# Patient Record
Sex: Female | Born: 1980 | Race: White | Hispanic: Yes | Marital: Married | Smoking: Current every day smoker
Health system: Southern US, Community
[De-identification: ages and names within clinical notes are randomized; demographics above are authoritative.]

## PROBLEM LIST (undated history)

## (undated) DIAGNOSIS — Z6841 Body Mass Index (BMI) 40.0 and over, adult: Secondary | ICD-10-CM

## (undated) DIAGNOSIS — F341 Dysthymic disorder: Secondary | ICD-10-CM

## (undated) DIAGNOSIS — G8929 Other chronic pain: Secondary | ICD-10-CM

## (undated) DIAGNOSIS — J0100 Acute maxillary sinusitis, unspecified: Secondary | ICD-10-CM

## (undated) DIAGNOSIS — R0602 Shortness of breath: Secondary | ICD-10-CM

## (undated) DIAGNOSIS — M25561 Pain in right knee: Secondary | ICD-10-CM

## (undated) DIAGNOSIS — M25461 Effusion, right knee: Secondary | ICD-10-CM

## (undated) DIAGNOSIS — F3289 Other specified depressive episodes: Secondary | ICD-10-CM

## (undated) DIAGNOSIS — R1011 Right upper quadrant pain: Secondary | ICD-10-CM

## (undated) DIAGNOSIS — G931 Anoxic brain damage, not elsewhere classified: Secondary | ICD-10-CM

## (undated) DIAGNOSIS — J45909 Unspecified asthma, uncomplicated: Secondary | ICD-10-CM

## (undated) DIAGNOSIS — Z8614 Personal history of Methicillin resistant Staphylococcus aureus infection: Secondary | ICD-10-CM

## (undated) DIAGNOSIS — F1911 Other psychoactive substance abuse, in remission: Secondary | ICD-10-CM

## (undated) HISTORY — DX: Personal history of Methicillin resistant Staphylococcus aureus infection: Z86.14

## (undated) HISTORY — DX: Other psychoactive substance abuse, in remission: F19.11

## (undated) HISTORY — DX: Anoxic brain damage, not elsewhere classified: G93.1

## (undated) HISTORY — PX: TUBAL LIGATION: SHX77

---

## 2005-12-24 ENCOUNTER — Observation Stay: Payer: Self-pay | Admitting: Obstetrics and Gynecology

## 2006-03-09 ENCOUNTER — Inpatient Hospital Stay: Payer: Self-pay

## 2008-07-16 ENCOUNTER — Inpatient Hospital Stay: Payer: Self-pay

## 2008-09-16 ENCOUNTER — Emergency Department: Payer: Self-pay | Admitting: Emergency Medicine

## 2009-01-06 ENCOUNTER — Emergency Department: Payer: Self-pay | Admitting: Emergency Medicine

## 2009-09-26 ENCOUNTER — Emergency Department: Payer: Self-pay | Admitting: Emergency Medicine

## 2010-01-27 LAB — TSH: TSH, High Sensitivity: 2.216 u[IU]/mL (ref 0.55–4.78)

## 2010-01-27 LAB — LUTEINIZING HORMONE: LH: 7.1 MIU/L

## 2010-01-28 LAB — CBC WITH AUTO DIFFERENTIAL
Basophils %: 0.4 % (ref 0–1)
Basophils Absolute: 0 10*3/uL (ref 0.0–0.1)
Eosinophils %: 3.7 % (ref 0–7)
Eosinophils Absolute: 0.2 10*3/uL (ref 0.0–0.6)
Hematocrit: 40.9 % (ref 37.0–47.0)
Hemoglobin: 13.9 g/dL (ref 12.0–16.0)
Lymphocytes %: 25.5 % (ref 16–42)
Lymphocytes Absolute: 1.5 10*3/uL (ref 0.6–4.3)
MCH: 31.1 pg — ABNORMAL HIGH (ref 27–31)
MCHC: 33.9 % (ref 32.0–36.0)
MCV: 91.9 FL (ref 80–100)
MPV: 8.6 FL (ref 7.5–11.1)
Monocytes %: 5.8 % (ref 4–12)
Monocytes Absolute: 0.3 10*3/uL (ref 0.2–1.1)
Neutrophils Absolute: 3.8 10*3/uL (ref 1.5–7.0)
Platelets: 253 10*3/uL (ref 150–400)
RBC: 4.46 10*6/uL (ref 4.20–5.40)
RDW: 12.9 % (ref 11.7–14.9)
Segs Relative: 64.6 % (ref 44–76)
WBC: 5.8 10*3/uL (ref 4.8–10.8)

## 2010-01-28 LAB — COMPREHENSIVE METABOLIC PANEL
ALT: 19 U/L (ref 7–35)
AST: 21 U/L (ref 8–33)
Albumin,Serum: 4.3 g/dL (ref 3.5–5.0)
Alkaline Phosphatase: 70 U/L (ref 25–100)
BUN: 13 mg/dL (ref 6–23)
CO2: 29 mMol/L (ref 20–32)
Calcium: 9.3 mg/dL (ref 8.3–10.6)
Chloride: 104 mMol/L (ref 99–109)
Creatinine: 0.7 mg/dL (ref 0.6–1.1)
GFR African American: >60 mL/min/{1.73_m2}
GFR Non-African American: >60 mL/min/{1.73_m2}
Glucose, GTT - Fasting: 81 mg/dL (ref 50–99)
Potassium: 4.3 MMOL/L (ref 3.5–5.1)
Sodium: 140 mmol/L (ref 136–145)
Total Bilirubin: 0.5 mg/dL (ref 0.3–1.2)
Total Protein: 7.1 g/dL (ref 6.4–8.3)

## 2010-01-28 LAB — LIPID PANEL
Cholesterol: 176 MG/DL (ref <200)
HDL: 32 MG/DL — ABNORMAL LOW (ref >60)
LDL Direct: 157 MG/DL — ABNORMAL HIGH (ref <100)
Triglycerides: 125 MG/DL (ref <150)

## 2010-01-28 LAB — ESTRADIOL: Estradiol: 130 pg/ml

## 2010-01-28 LAB — FOLLICLE STIMULATING HORMONE: FSH: 6 MIU/L

## 2011-03-07 ENCOUNTER — Emergency Department: Payer: Self-pay | Admitting: Emergency Medicine

## 2011-03-27 ENCOUNTER — Emergency Department: Payer: Self-pay | Admitting: Emergency Medicine

## 2011-03-29 ENCOUNTER — Emergency Department: Payer: Self-pay | Admitting: Emergency Medicine

## 2011-04-15 ENCOUNTER — Emergency Department: Payer: Self-pay | Admitting: Emergency Medicine

## 2011-04-18 ENCOUNTER — Emergency Department: Payer: Self-pay | Admitting: Emergency Medicine

## 2011-09-02 LAB — VARICELLA ZOSTER ANTIBODY, IGG: Varicella-Zoster Virus Ab, Igg: 296.1

## 2012-10-03 NOTE — Progress Notes (Signed)
Physical Therapy  Initial Assessment  Date: 10/03/2012  Patient Name: Linda Blevins  MRN: [id number]  DOB: [birth year]     Treatment Diagnosis: Pain, weakness,     Subjective   General  Chart Reviewed: Yes  Patient assessed for rehabilitation services?: Yes  Additional Pertinent Hx: Pt reports only past medical hx is a hysterectomy ; Pt states she is in good health.   Referring Practitioner: Richardean Canal   Diagnosis: 726.5 R hip (Enthesopathy of the hip)  Follows Commands: Within Functional Limits  General Comment  Comments: LEFS: 61/80   ;   Pt reports that she has an x-ray of the R hip approx 2 months ago - pt reports that the x-rays were negative and the doctor suggested it is likely bursitis   PT Visit Information  PT Insurance Information: Anthem - 30 visits - 0 used this year   Subjective  Subjective: Pt reports insidious onset of R hip pain approx 1.5 years ago. Pt reports that she is trying to lose weight, but it is to the point that going to the gym is getting too painful.   Pain Screening  Patient Currently in Pain: Yes  Pain Assessment  Pain Assessment: 0-10  Pain Level: 4 (At worst 8/10)  Pain Location: Hip  Pain Orientation: Right (Lateral aspect of hip )  Pain Descriptors: Aching;Throbbing  Pain Frequency: Continuous (Intensity varies with activity )  Effect of Pain on Daily Activities: Pt has had to call off work several times due to the intensity of the pain   Increases pain: weight bearing for extended periods of time, sitting for prolonged periods, sleeping on side  Decreases pain: heat and biofreeze      Home Living  Home Living  Type of Home: House  Home Layout: One level;Laundry in basement  Home Access:  (4 entry steps )  Additional Comments: Repetitive use of steps causes irritation   Orientation  Overall Orientation Status: Within Normal Limits  Objective  IADL History  Homemaking Responsibilities: Yes  Meal Prep Responsibility: Primary  Laundry Responsibility: Primary  Cleaning  Responsibility: Primary  Shopping Responsibility: Primary  Child Care Responsibility: Primary (2 children (6 and 8))  Active Driver: Yes  Mode of Transportation: Car  Occupation: Full time employment  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   Observation/Palpation  Posture: Good  Palpation: TTP noted at R greater trochanter and gluteus mm ; Pelvic landmarks symmetrical   Observation: Pes planus of B feet, valgus alignment of bilat LEs   ;  pt is overweight   AROM RLE (degrees)  RLE AROM: WNL  AROM LLE (degrees)  LLE AROM: WNL  Strength RLE  Strength RLE: WNL  Comment: Grossly 5/5 except hip mm 4+/5  Strength LLE  Strength LLE: WNL  Comment: Grossly 5/5 except hip mm 4+/5  Tone RLE  RLE Tone: Normotonic  Tone LLE  LLE Tone: Normotonic  Motor Control  Gross Motor?: WNL  Additional Measures  Flexibility: Hamstrings (90-90) L and R -15 deg ; piriformis mild restriction, IT band mildly restricted R ;  Thomas test positive bilaterally   Special Tests: Hip scour negative bilaterally ; SLR test negative bilaterally   Sensation  Overall Sensation Status: WNL (Pt denies N/T)        Ambulation  Ambulation?: Yes  Ambulation 1  Comments: Mildly antalgic  Assessment   Assessment: Decreased strength;Decreased high-level IADLs  Assessment: Pt presents with 1.5 year history of insidious onset R hip pain. 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 Pt demonstrates slight decrease in hip strength bilaterally (4+/5) and has TTP at R greater trochanter and gluteus mm. Pt is negative for hip scour and SLR test. Pt would benefit from skilled therapy to decrease pain and increase hip strength to allow improved daily function.   Treatment Diagnosis: Pain, weakness,   Prognosis: Good  Time In: 0810  Time Out: 0858  Timed Code  Treatment Minutes: 24 Minutes  Total Treatment Time: 48  Activity Tolerance  Activity Tolerance: Patient Tolerated treatment well  Plan  Plan: Plan of care initiated       Plan   Plan  Plan: Plan of care initiated  Timed Code Treatment Minutes: 24 Minutes  Total Treatment Time: 48  Frequency and duration of tx  Days: 2-3  Weeks: up to 6    Goals  Short term goals  Time Frame for Short term goals: 3 weeks   Short term goal 1: Pt to be I and compliant with HEP.   Long term goals  Time Frame for Long term goals : 6 weeks   Long term goal 1: Pt to tolerate full work week with pain no greater than 3/10 in R hip.   Long term goal 2: Pt to demonstrate hip strength 5/5   Long term goal 3: LEFS to improve to 70/80       Nadene Rubins, PT, DPT

## 2012-10-03 NOTE — Other (Addendum)
Physical Therapy Daily Treatment Note  Date:  10/03/2012    Patient Name:  Linda Blevins    DOB:  [birth year]  MRN: [id number]  Restrictions/Precautions:    Diagnosis: Diagnosis: 726.5 R hip (Enthesopathy of the hip)    Treatment Diagnosis:  Pain, weakness  Insurance/Certification information:  Anthem ; 30 visits (0 used this year)  Next Physician Visit:  Referring Physician:  Richardean Canal   Plan of care signed (Y/N):    Visit# / total visits: 1 / 15  Initial Pain level: 4/10  (8/10 at worst)     Subjective:  Insidious onset of R hip pain approx 1.5 years ago.   Any changes to Ambulatory Summary Sheet?      Goals:  Short term goals  Time Frame for Short term goals: 3 weeks   Short term goal 1: Pt to be I and compliant with HEP.   Long term goals  Time Frame for Long term goals : 6 weeks   Long term goal 1: Pt to tolerate full work week with pain no greater than 3/10 in R hip.   Long term goal 2: Pt to demonstrate hip strength 5/5   Long term goal 3: LEFS to improve to 70/80      Exercise/Equipment/Modalities Date 10-03-12 Date Date Date      Evaluation         [place] , 1.5w/cm2, 100%, R greater trochanter x 8 min (+3 min for set up)  Pt in SL with pillow between knees         Clamshells #1 and #2 ;  20 reps ea         SLR Next visit         Prone hip ext Next visit        Stretches (hip flexor , piriformis, hamstrings) Next visit                                                                                                                                 Modality/intervention used:  [x]  Therapeutic Exercise  []  Modalities:  []  Therapeutic Activity     [x]  Ultrasound  []  Elec  Stim  []  Gait Training      []  Cervical Traction []  Lumbar Traction  []  Neuromuscular Re-education    []  Cold/hotpack []  Iontophoresis   [x]  Instruction in HEP      []  Vasopneumatic     []  Manual Therapy               []  Aquatic Therapy      Home Exercise Program/Education provided to patient:  Pt given handout for initial exercises to  be included in HEP - pt demonstrated proper technique - will review exercises at next visit.  (Initial HEP to include clamshell #1 and #2)    Discussed sleeping with pillow between knees when sleeping in SL position.     Manual Treatments/ Other Therapeutic Activities:  Objective Findings:  See evaluation  At initial evaluation:   B hip strength 4+/5   TTP at R greater trochanter and gluteus mm.     Communication with other providers:    Adverse reactions to treatment:    Treatment/Activity Tolerance:  []  Patient limited by fatigue       []  Patient limited by pain []  Patient limited by other medical complications  [x]  Other: Tolerated initial exercises well     Post Tx Pain Rating: 3/10    Plan:   []  Continue per plan of care []  Alter current plan   [x]  Plan of care initiated []  Hold pending MD visit []  Discharge    Plan for Next Session:      Progress Note Due:        Time In:810  Time Out:858  Timed Code Treatment Minutes:  24  Total Treatment Minutes:  48    Electronically signed by:  Nadene Rubins, PT 10/03/2012, 9:13 AM

## 2012-10-03 NOTE — Plan of Care (Signed)
Outpatient Physical Therapy        [x]  Phone: [phone]   Fax: [phone]   Pediatric Therapy          []  Phone: [phone]   Fax: [phone]  Pediatric [place]          []  Phone: [phone]   Fax: [phone]      To: Referring Practitioner: Richardean Canal     From: Nadene Rubins, PT     Patient: Linda Blevins       DOB: 1981/11/19  Diagnosis: Diagnosis: 726.5 R hip (Enthesopathy of the hip)   Treatment Diagnosis: Treatment Diagnosis: Pain, weakness,    Date: 10/03/2012    Physical Therapy Certification/Re-Certification Form  Dear Dr. Raeanne Barry,   The following patient has been evaluated for physical therapy services and for therapy to continue. Please review the attached evaluation and/or summary of the patient's plan of care, and verify that you agree therapy should continue by signing the attached document and sending it back to our office.    Assessment:  Assessment: Decreased strength;Decreased high-level IADLs  Assessment: Pt presents with 1.5 year history of insidious onset R hip pain. 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 Pt demonstrates slight decrease in hip strength bilaterally (4+/5) and has TTP at R greater trochanter and gluteus mm. Pt is negative for hip scour and SLR test. Pt would benefit from skilled therapy to decrease pain and increase hip strength to allow improved daily function.     Plan of Care/Treatment to date:  [x]  Therapeutic Exercise    [x]  Modalities: As needed   []  Therapeutic Activity     [x]  Ultrasound  [x]  Elec Stimulation  []  Gait Training      []  Cervical Traction []  Lumbar Traction  []  Neuromuscular Re-education    [x]  Cold/hotpack []  Iontophoresis   [x]  Instruction in HEP     Other:  [x]  Manual Therapy      []  vasopneumatic            []  Aquatic Therapy      []            ?      Frequency/Duration:  # Days per week: []  1 day # Weeks: []  1  week []  5 weeks     [x]  2-3 days?   []  2 weeks [x]  up to 6 weeks     []  3 days   []  3 weeks []  7 weeks     []  4 days   []  4 weeks []  8 weeks    Rehab Potential/Progress: []  Excellent [x]  Good []  Fair  []  Poor     Goals:  Short term goals  Time Frame for Short term goals: 3 weeks   Short term goal 1: Pt to be I and compliant with HEP.   Long term goals  Time Frame for Long term goals : 6 weeks   Long term goal 1: Pt to tolerate full work week with pain no greater than 3/10 in R hip.   Long term goal 2: Pt to demonstrate hip strength 5/5   Long term goal 3: LEFS to improve to 70/80     Electronically signed by:  Nadene Rubins, PT, 10/03/2012, 9:11 AM        If you have any questions or concerns, please don't hesitate  to call.  Thank you for your referral.      Physician Signature:_________________Date:____________Time: ________  By signing above, therapist???s plan is approved by physician

## 2012-10-04 NOTE — Other (Signed)
Physical Therapy Daily Treatment Note  Date:  10/04/2012    Patient Name:  Linda Blevins    DOB:  February 03, 1981  MRN: [id number]  Restrictions/Precautions:    Diagnosis: Diagnosis: 726.5 R hip (Enthesopathy of the hip)    Treatment Diagnosis:  Pain, weakness  Insurance/Certification information:  Anthem ; 30 visits (0 used this year)  Next Physician Visit:  Referring Physician:  Richardean Canal   Plan of care signed (Y/N):    Visit# / total visits: 2 / 15  Initial Pain level: 2/10      Subjective:  Pt reports pain was 4/10 at the start of her work shift last night, but got better as the shift went on and is down to 1-2/10 this morning.   Any changes to Ambulatory Summary Sheet?      Goals:  Short term goals  Time Frame for Short term goals: 3 weeks   Short term goal 1: Pt to be I and compliant with HEP.   Long term goals  Time Frame for Long term goals : 6 weeks   Long term goal 1: Pt to tolerate full work week with pain no greater than 3/10 in R hip.   Long term goal 2: Pt to demonstrate hip strength 5/5   Long term goal 3: LEFS to improve to 70/80      Exercise/Equipment/Modalities Date 10-03-12 Date 10-04-12 Date Date      Evaluation         [place] , 1.5w/cm2, 100%, R greater trochanter x 8 min (+3 min for set up)  Pt in SL with pillow between knees  , 1.5w/cm2, 100%, R greater trochanter x 8 min (+3 min for set up)  Pt in SL with pillow between knees        Clamshells #1 and #2 ;  20 reps ea  #1 and # 2 ; 20 reps ea        SLR Next visit  20 reps        Prone hip ext Next visit  20 reps ; R/L      Stretches (hip flexor , piriformis, hamstrings) Next visit  x30 sec ea R/L   Piriformis - supine   HS - seated w/ leg on mat   Hip flexor - runners stretch      Lunges                                                                                                                          Modality/intervention used:  [x]  Therapeutic Exercise  []  Modalities:  []  Therapeutic Activity     [x]  Ultrasound  []  Elec   Stim  []  Gait Training      []  Cervical Traction []  Lumbar Traction  []  Neuromuscular Re-education    []  Cold/hotpack []  Iontophoresis   [x]  Instruction in HEP      []  Vasopneumatic     []  Manual Therapy               []   Aquatic Therapy      Home Exercise Program/Education provided to patient:  Reviewed clamshells - added stretches     10-03-12: Discussed sleeping with pillow between knees when sleeping in SL position.     Manual Treatments/ Other Therapeutic Activities:    Objective Findings:  See evaluation  At initial evaluation:   B hip strength 4+/5   TTP at R greater trochanter and gluteus mm.     Communication with other providers:    Adverse reactions to treatment:    Treatment/Activity Tolerance:  []  Patient limited by fatigue       []  Patient limited by pain []  Patient limited by other medical complications  [x]  Other: Minimal muscle fatigue with exercises      Post Tx Pain Rating: 1/10    Plan:   [x]  Continue per plan of care []  Alter current plan   []  Plan of care initiated []  Hold pending MD visit []  Discharge    Plan for Next Session:  Cont current POC, advance as tolerated     Progress Note Due:        Time In:800  Time Out:830  Timed Code Treatment Minutes:  30  Total Treatment Minutes:  30    Electronically signed by:  Nadene Rubins, PT 10/04/2012, 8:00 AM

## 2012-10-08 NOTE — Other (Signed)
Physical Therapy Daily Treatment Note  Date:  10/08/2012    Patient Name:  Linda Blevins    DOB:  10/13/1981  MRN: [id number]  Restrictions/Precautions:    Diagnosis: Diagnosis: 726.5 R hip (Enthesopathy of the hip)    Treatment Diagnosis:  Pain, weakness  Insurance/Certification information:  Anthem ; 30 visits (0 used this year)  Next Physician Visit:  Referring Physician:  Richardean Canal   Plan of care signed (Y/N):    Visit# / total visits: 3 / 15  Initial Pain level: 4/10      Subjective: Pt states she felt pretty good over the weekend, pain 2/10. Having increase pain this morning due to just getting off work. Pt also states pillow between her knees at night has been helpful.   Any changes to Ambulatory Summary Sheet?no      Goals:  Short term goals  Time Frame for Short term goals: 3 weeks   Short term goal 1: Pt to be I and compliant with HEP.   Long term goals  Time Frame for Long term goals : 6 weeks   Long term goal 1: Pt to tolerate full work week with pain no greater than 3/10 in R hip.   Long term goal 2: Pt to demonstrate hip strength 5/5   Long term goal 3: LEFS to improve to 70/80      Exercise/Equipment/Modalities Date 10-03-12 Date 10-04-12 Date 10/08/12 Date      Evaluation         [place] , 1.5w/cm2, 100%, R greater trochanter x 8 min (+3 min for set up)  Pt in SL with pillow between knees  , 1.5w/cm2, 100%, R greater trochanter x 8 min (+3 min for set up)  Pt in SL with pillow between knees  , 1.5w/cm2, 100%, R greater trochanter x 9 min (+3 min for set up)  Pt in SL with pillow between knees      Clamshells #1 and #2 ;  20 reps ea  #1 and # 2 ; 20 reps ea  #1 and # 2 ; 20 reps ea      SLR Next visit  20 reps  20 reps R/L      Prone hip ext Next visit  20 reps ; R/L 20 reps ; R/L     Stretches (hip flexor , piriformis, hamstrings) Next visit  x30 sec ea R/L   Piriformis - supine   HS - seated w/ leg on mat   Hip flexor - runners stretch  x 1 min ea R/L - Manual Piriformis and HS  seated leg on mat  Hip flexor - runners stretch 30 sec x 2     Lunges     X 10 reps R/L with support                                                                                                                     Modality/intervention used:  [x]  Therapeutic Exercise  []  Modalities:  []   Therapeutic Activity     [x]  Ultrasound  []  Elec  Stim  []  Gait Training      []  Cervical Traction []  Lumbar Traction  []  Neuromuscular Re-education    []  Cold/hotpack []  Iontophoresis   [x]  Instruction in HEP      []  Vasopneumatic     []  Manual Therapy               []  Aquatic Therapy      Home Exercise Program/Education provided to patient:  Pt reports good compliance. To continue as instructed.    10-03-12: Discussed sleeping with pillow between knees when sleeping in SL position.     Manual Treatments/ Other Therapeutic Activities:manual Piriformis stretch    Objective Findings:  See evaluation  At initial evaluation:   B hip strength 4+/5   TTP at R greater trochanter and gluteus mm.     Communication with other providers:    Adverse reactions to treatment:    Treatment/Activity Tolerance:  []  Patient limited by fatigue       []  Patient limited by pain []  Patient limited by other medical complications  [x]  Other: Minimal muscle fatigue following exercises      Post Tx Pain Rating: 3-4/10    Plan:   [x]  Continue per plan of care []  Alter current plan   []  Plan of care initiated []  Hold pending MD visit []  Discharge    Plan for Next Session:  Cont current POC, advance as tolerated     Progress Note Due:        Time In:816  Time Out:855  Timed Code Treatment Minutes:  39  Total Treatment Minutes:  39    Electronically signed by:  Madelon Lips, PTA 10/08/2012, 8:16 AM

## 2012-10-10 NOTE — Other (Addendum)
Physical Therapy Daily Treatment Note  Date:  10/10/2012    Patient Name:  Linda Blevins    DOB:  November 15, 1981  MRN: [id number]  Restrictions/Precautions:    Diagnosis: Diagnosis: 726.5 R hip (Enthesopathy of the hip)    Treatment Diagnosis:  Pain, weakness  Insurance/Certification information:  Anthem ; 30 visits (0 used this year)  Next Physician Visit:  Referring Physician:  Richardean Canal   Plan of care signed (Y/N):  YES  Visit# / total visits: 4 / 15  Initial Pain level: 0/10      Subjective: Pt reports that overall her hip is doing a little bit better. Pt states that at night and at the end of her work shifts it is still painful, but not quite as bad. No pain this morning (did not work last night).   Pt returned to working out at the gym without any problems.   Any changes to Ambulatory Summary Sheet?no      Goals:  Short term goals  Time Frame for Short term goals: 3 weeks   Short term goal 1: Pt to be I and compliant with HEP.   Long term goals  Time Frame for Long term goals : 6 weeks   Long term goal 1: Pt to tolerate full work week with pain no greater than 3/10 in R hip.   Long term goal 2: Pt to demonstrate hip strength 5/5   Long term goal 3: LEFS to improve to 70/80      Exercise/Equipment/Modalities Date 10-03-12 Date 10-04-12 Date 10/08/12 Date 10-10-12      Evaluation         [place] , 1.5w/cm2, 100%, R greater trochanter x 8 min (+3 min for set up)  Pt in SL with pillow between knees  , 1.5w/cm2, 100%, R greater trochanter x 8 min (+3 min for set up)  Pt in SL with pillow between knees  , 1.5w/cm2, 100%, R greater trochanter x 9 min (+3 min for set up)  Pt in SL with pillow between knees , 1.5w/cm2, 100%, R greater trochanter x 9 min (+3 min for set up)  Pt in SL with pillow between knees     Clamshells #1 and #2 ;  20 reps ea  #1 and # 2 ; 20 reps ea  #1 and # 2 ; 20 reps ea #1 and #2 ; 25 reps ea      SLR Next visit  20 reps  20 reps R/L 25 reps R/L      Prone hip ext Next visit   20 reps ; R/L 20 reps ; R/L 25 reps R/L     Stretches (hip flexor , piriformis, hamstrings) Next visit  x30 sec ea R/L   Piriformis - supine   HS - seated w/ leg on mat   Hip flexor - runners stretch  x 1 min ea R/L - Manual Piriformis and HS seated leg on mat  Hip flexor - runners stretch 30 sec x 2 HS (edge of mat) x 1 min R/L  Piriformis (supine) x 1 min R/L   Hip Flexor (runner's stretch x 1 min R/L    Lunges     X 10 reps R/L with support x15 reps (alt R/L)   W/o support     Squats on total gym (with Red TB at knees for hip abd during squat)    Next visit  Modality/intervention used:  [x]  Therapeutic Exercise  []  Modalities:  []  Therapeutic Activity     [x]  Ultrasound  []  Elec  Stim  []  Gait Training      []  Cervical Traction []  Lumbar Traction  []  Neuromuscular Re-education    []  Cold/hotpack []  Iontophoresis   [x]  Instruction in HEP      []  Vasopneumatic     []  Manual Therapy               []  Aquatic Therapy      Home Exercise Program/Education provided to patient:  Pt reports good compliance. To continue as instructed. - Provided handout for piriformis stretch     10-03-12: Discussed sleeping with pillow between knees when sleeping in SL position.     Manual Treatments/ Other Therapeutic Activities:    Objective Findings:  See evaluation  At initial evaluation:   B hip strength 4+/5   TTP at R greater trochanter and gluteus mm.     Communication with other providers:    Adverse reactions to treatment:    Treatment/Activity Tolerance:  []  Patient limited by fatigue       []  Patient limited by pain []  Patient limited by other medical complications  [x]  Other: Minimal muscle fatigue following exercises      Post Tx Pain Rating: 0/10    Plan:   [x]  Continue per plan of care []  Alter current plan   []  Plan of care initiated []  Hold pending MD visit []  Discharge    Plan for Next Session:  Cont current POC,  advance as tolerated     Progress Note Due:        Time In:931  Time Out:1009  Timed Code Treatment Minutes:  38  Total Treatment Minutes:  38    Electronically signed by:  Nadene Rubins, PT 10/10/2012, 9:27 AM

## 2012-10-15 NOTE — Other (Signed)
Physical Therapy  Cancellation/No-show Note  Patient Name:  Linda Blevins  DOB:  1980/11/29   Date:  10/15/2012  Cancelled visits to date: 0  No-shows to date: 1    For today's appointment patient:  []   Cancelled  []   Rescheduled appointment  [x]   No-show     Reason given by patient:  []   Patient ill  []   Conflicting appointment  []   No transportation    []   Conflict with work  []   No reason given  []   Other:     Comments:      Electronically signed by:  Selena Lesser, PTA,  10/15/2012, 9:13 AM

## 2012-10-16 NOTE — Other (Signed)
Physical Therapy  Cancellation/No-show Note  Patient Name:  Linda Blevins  DOB:  15-Jan-1981   Date:  10/16/2012  Cancelled visits to date: 0  No-shows to date: 2    For today's appointment patient:  []   Cancelled  []   Rescheduled appointment  [x]   No-show     Reason given by patient:  []   Patient ill  []   Conflicting appointment  []   No transportation    []   Conflict with work  []   No reason given  []   Other:     Comments:      Electronically signed by:  Nadene Rubins, PT, 10/16/2012, 8:44 AM

## 2012-10-17 NOTE — Other (Signed)
Physical Therapy  Cancellation/No-show Note  Patient Name:  Linda Blevins  DOB:  [birth year]   Date:  10/17/2012  Cancelled visits to date: 0  No-shows to date: 3    For today's appointment patient:  []   Cancelled  []   Rescheduled appointment  [x]   No-show     Reason given by patient:  []   Patient ill  []   Conflicting appointment  []   No transportation    []   Conflict with work  []   No reason given  []   Other:     Comments:      Electronically signed by:  Madelon Lips, PTA, 10/17/2012, 9:29 AM

## 2012-11-20 NOTE — Discharge Summary (Signed)
Outpatient Physical Therapy        [x]  Phone: [phone]   Fax: [phone]   Physician: Richardean Canal     From: Nadene Rubins, PT     Patient: Linda PATRICELLI                  DOB: 02/18/1981  Diagnosis: 726.5 R hip     Date: 11/20/2012  Treatment Diagnosis:  Pain, weakness     []   Progress Note                [x]   Discharge Note    Total Visits to date:  4  Cancels/No-shows to date:  3 (in a row)    Subjective: Pt last attended therapy on 10-10-12. At that visit patient denied having pain at the current time. Pt reported that overall her hip was doing a little better, but was still painful at night and after working (less intense pain though). Pt had reported being able to return to the gym without any problems doing light exercise.     Objective/Significant Findings At Last Visit/Comments:    No change from evaluation due to only attending 3 visits after eval.     Assessment:  Pt d/c from PT services at this time due to poor compliance. Pt no showed 3 visits in a row.     Goal Status:   Short term goals   Time Frame for Short term goals: 3 weeks   Short term goal 1: Pt to be I and compliant with HEP. MET  Long term goals   Time Frame for Long term goals : 6 weeks   Long term goal 1: Pt to tolerate full work week with pain no greater than 3/10 in R hip. Not met - pt still having pain after working   Long term goal 2: Pt to demonstrate hip strength 5/5 Not met - pt has HEP to further strengthen hip  Long term goal 3: LEFS to improve to 70/80 Not reassessed       Patient Status: []  Continue per initial plan of Care     [x]  Patient now discharged     []  Additional visits requested, Please re-certify for additional visits:          Electronically signed by:  Nadene Rubins, PT, 11/20/2012, 1:46 PM    If you have any questions or concerns, please don't hesitate to call.  Thank you for your referral.

## 2013-10-29 LAB — LIPID PANEL
Cholesterol, Total: 191 mg/dL
HDL: 36 mg/dL (ref 35–70)
LDL Calculated: 130 mg/dL (ref 0–160)
Triglycerides: 127 mg/dL
VLDL: 25 mg/dL

## 2014-05-22 ENCOUNTER — Emergency Department: Payer: Self-pay

## 2014-05-22 LAB — CBC WITH DIFFERENTIAL/PLATELET
Basophil #: 0.1 10*3/uL (ref 0.0–0.1)
Basophil %: 0.7 %
Eosinophil #: 0.2 10*3/uL (ref 0.0–0.7)
Eosinophil %: 2.5 %
HCT: 36 % (ref 35.0–47.0)
HGB: 12.2 g/dL (ref 12.0–16.0)
LYMPHS ABS: 2.7 10*3/uL (ref 1.0–3.6)
Lymphocyte %: 30.6 %
MCH: 31.2 pg (ref 26.0–34.0)
MCHC: 33.9 g/dL (ref 32.0–36.0)
MCV: 92 fL (ref 80–100)
Monocyte #: 0.6 x10 3/mm (ref 0.2–0.9)
Monocyte %: 7 %
NEUTROS ABS: 5.2 10*3/uL (ref 1.4–6.5)
Neutrophil %: 59.2 %
PLATELETS: 262 10*3/uL (ref 150–440)
RBC: 3.91 10*6/uL (ref 3.80–5.20)
RDW: 13 % (ref 11.5–14.5)
WBC: 8.8 10*3/uL (ref 3.6–11.0)

## 2014-05-22 LAB — PROTIME-INR
INR: 1
Prothrombin Time: 12.7 secs (ref 11.5–14.7)

## 2014-05-22 LAB — APTT: ACTIVATED PTT: 26 s (ref 23.6–35.9)

## 2014-07-19 ENCOUNTER — Inpatient Hospital Stay: Admit: 2014-07-19 | Discharge: 2014-07-19

## 2014-07-19 MED ORDER — OXYCODONE-ACETAMINOPHEN 5-325 MG PO TABS
5-325 MG | Freq: Once | ORAL | Status: AC
Start: 2014-07-19 — End: 2014-07-19
  Administered 2014-07-19: 20:00:00 2 via ORAL

## 2014-07-19 MED ORDER — IBUPROFEN 800 MG PO TABS
800 MG | ORAL_TABLET | Freq: Four times a day (QID) | ORAL | Status: DC | PRN
Start: 2014-07-19 — End: 2015-12-17

## 2014-07-19 MED ORDER — OXYCODONE-ACETAMINOPHEN 5-325 MG PO TABS
5-325 MG | ORAL | Status: DC
Start: 2014-07-19 — End: 2014-07-19

## 2014-07-19 MED ORDER — ORPHENADRINE CITRATE ER 100 MG PO TB12
100 MG | ORAL_TABLET | Freq: Two times a day (BID) | ORAL | Status: AC
Start: 2014-07-19 — End: 2014-07-29

## 2014-07-19 MED ORDER — OXYCODONE-ACETAMINOPHEN 5-325 MG PO TABS
5-325 MG | ORAL_TABLET | ORAL | Status: DC | PRN
Start: 2014-07-19 — End: 2015-12-17

## 2014-07-19 MED FILL — OXYCODONE-ACETAMINOPHEN 5-325 MG PO TABS: 5-325 MG | ORAL | Qty: 2

## 2014-07-19 MED FILL — OXYCODONE-ACETAMINOPHEN 5-325 MG PO TABS: 5-325 MG | ORAL | Qty: 1

## 2014-07-19 NOTE — Discharge Instructions (Signed)
Learning About a Closed Head Injury  What is a closed head injury?     A closed head injury happens when your head gets hit hard. The strong force of the blow causes your brain to shake in your skull. This movement can cause the brain to bruise, swell, or tear. Sometimes nerves or blood vessels also get damaged. This can cause bleeding in or around the brain.  A concussion is a type of closed head injury.  What are the symptoms?  If you have a mild concussion, you may have a mild headache or feel "not quite right." These symptoms are common. They usually go away over a few days to 4 weeks.  But sometimes after a concussion, you feel like you can't function as well as before the injury. And you have new symptoms. This is called postconcussive syndrome. You may:   Find it harder to solve problems, think, concentrate, or remember.   Have headaches.   Have changes in your sleep patterns, such as not being able to sleep or sleeping all the time.   Have changes in your personality.   Not be interested in your usual activities.   Feel angry or anxious without a clear reason.   Have changes in your sex drive.   Lose your sense of taste or smell.   Be dizzy, lightheaded, or unsteady. It may be hard to stand or walk.  How is a closed head injury treated?  Any person who may have a concussion needs to see a doctor. Some people have to stay in the hospital to be watched. Others can go home safely. But if you go home, make sure to have someone watch you closely.  Rest is the best treatment. Get plenty of sleep at night. And try to rest during the day.   Avoid activities that are physically or mentally demanding. These include housework, exercise, and schoolwork. And don't play video games, send text messages, or use the computer. You may need to change your school or work schedule to be able to avoid these activities.   Ask your doctor when it's okay to drive, ride a bike, or operate machinery.   Take an  over-the-counter pain medicine, such as acetaminophen (Tylenol), ibuprofen (Advil, Motrin), or naproxen (Aleve). Be safe with medicines. Read and follow all instructions on the label.   Check with your doctor before you use any other medicines for pain.   Do not drink alcohol or use illegal drugs. They can slow recovery. They can also increase your risk of getting a second head injury.  Follow-up care is a key part of your treatment and safety. Be sure to make and go to all appointments, and call your doctor if you are having problems. It's also a good idea to know your test results and keep a list of the medicines you take.   Where can you learn more?   Go to [email or web address] and sign in to your MyChart account. Enter E235 in the Search Health Information box to learn more about "Learning About a Closed Head Injury."    If you do not have an account, please click on the "Sign Up Now" link.      2006-2015 [employer]. Care instructions adapted under license by [facility]. This care instruction is for use with your licensed healthcare professional. If you have questions about a medical condition or this instruction, always ask your healthcare professional. [employer], Inc[employer] disclaims any warranty or liability for your use of   this information.  Content Version: 10.5.422740; Current as of: April 24, 2013

## 2014-07-19 NOTE — ED Notes (Signed)
Return to room from radiology. resp even,unlabored. Skin w/d. States fell off ladder approx 5-6 ft. Denies hitting head or loc. A/o x3. Pupils equal and round, reactive 4-2. States  + headache. Denies visual distubances. Pain to rt arm with decreased rom. PA at bedside for neuro assessment    Howie Ill, RN  07/19/14 1615

## 2014-07-19 NOTE — ED Provider Notes (Signed)
Triage Chief Complaint:   Arm Pain; Hip Pain; Head Injury; and Back Pain    HOPI:  Linda Blevins is a 33 y.o. female that presents for right sided pain. Onset was around 1400 while attempting to hang something on some siding and she lost her balance on the top of a ladder and she fell about 5 feet on her right side. + LOC she states for about a minute. She cannot move her right arm that well secondary to pain and she has right posterior hip and upper back pain with this as well. No neuro complaints. No alleviating factors PTA. Pain worse with movement. She has a headache from the back of her head forward. Not worst headache of life. No arm or leg weakness, numbness, or tingling, difficulties concentrating, n/v. No CP or SOB. Denies saddle anaesthesia, loss of control or bowel or bladder. She has exhibited no unusual behaviors per family.    ROS:  At least 10 systems reviewed and otherwise acutely negative except as in the HOPI.    History reviewed. No pertinent past medical history.  Past Surgical History   Procedure Laterality Date   ??? Hysterectomy     ??? Tonsillectomy     ??? Adenoidectomy       History reviewed. No pertinent family history.  History     Social History   ??? Marital Status: Married     Spouse Name: N/A     Number of Children: N/A   ??? Years of Education: N/A     Occupational History   ??? Not on file.     Social History Main Topics   ??? Smoking status: Never Smoker    ??? Smokeless tobacco: Not on file   ??? Alcohol Use: No   ??? Drug Use: Not on file   ??? Sexual Activity: Not on file     Other Topics Concern   ??? Not on file     Social History Narrative   ??? No narrative on file     Current Facility-Administered Medications   Medication Dose Route Frequency Provider Last Rate Last Dose   ??? oxyCODONE-acetaminophen (PERCOCET) 5-325 MG per tablet 2 tablet  2 tablet Oral Once Merrilee Seashore, MD         No current outpatient prescriptions on file.     Allergies   Allergen Reactions   ??? Pcn [Penicillins] Hives   ???  Sulfa Antibiotics Hives       Nursing Notes Reviewed    Physical Exam:  ED Triage Vitals   Enc Vitals Group      BP 07/19/14 1456 121/85 mmHg      Pulse 07/19/14 1456 70      Resp 07/19/14 1456 17      Temp 07/19/14 1456 98.6 ??F (37 ??C)      Temp Source 07/19/14 1456 Oral      SpO2 07/19/14 1456 96 %      Weight 07/19/14 1456 260 lb (117.935 kg)      Height 07/19/14 1456  (1.6 m)      Head Cir --       Peak Flow --       Pain Score --       Pain Loc --       Pain Edu? --       Excl. in GC? --      GENERAL APPEARANCE: Awake and alert. Cooperative. No acute distress.  HEAD: Normocephalic. Atraumatic.  EYES: EOMI, PERRL. Sclera anicteric.  ENT: Mucous membranes are moist. Tolerates saliva. No trismus.  NECK: Supple. No meningismus. Trachea midline. FROM neck  HEART: RRR. Radial pulses 2+.  LUNGS: Respirations unlabored. CTAB  ABDOMEN: Soft. Non-tender.  EXTREMITIES: No acute deformities. FROM of her right forearm at elbow, no wrist ttp, she has ttp across her posterior elbow, she has no ttp of her shoulder, she has diffuse mild ttp of her upper right back including over her scapula, - anterior lift-off, - Neer's, - Drop, - Full-can, + right buttock ttp, no c-, t-, or l-spine ttp  SKIN: Warm and dry. She has 2 red marks parallel to each approx 7 cm apart from each other across her entire upper back, this area is ttp  NEUROLOGICAL: No gross facial drooping. Moves all 4 extremities spontaneously. 2+ DTRs equal upper and lower, 5/5 strength BUE and BLE, normal finger to nose and heel to shin tests bil, light touch sensation intact throughout the exam, GCS = 15, A&O x 3  PSYCHIATRIC: Normal mood.    I have reviewed and interpreted all of the currently available lab results from this visit (if applicable):  No results found for this visit on 07/19/14.   Radiographs (if obtained):   The following radiograph was interpreted by myself in the absence of a radiologist:   Radiologist's Report Reviewed:  CT head wo  contrast:   EXAMINATION:  CT OF THE HEAD WITHOUT CONTRAST 07/19/2014 3:13 pm:  TECHNIQUE:  CT of the head was performed without the administration of intravenous  contrast.  COMPARISON:  None.  HISTORY:  fall, pain  FINDINGS:  BRAIN/VENTRICLES: There is no acute intracranial hemorrhage, mass effect or  midline shift. No abnormal extra-axial fluid collection. The gray-white  differentiation is maintained without evidence of an acute infarct. There is  no evidence of hydrocephalus.  ORBITS: The visualized portion of the orbits demonstrate no acute abnormality.  SINUSES: The visualized paranasal sinuses and mastoid air cells demonstrate  no acute abnormality.  SOFT TISSUES/SKULL: No acute abnormality of the visualized skull or soft  tissues.  IMPRESSION:  No acute intracranial abnormality.    DICTATED BY: HUME, DALE     CT c-spine wo contrast:   EXAMINATION:  CT OF THE CERVICAL SPINE WITHOUT CONTRAST 07/19/2014 3:23 pm:  TECHNIQUE:  CT of the cervical spine was performed without the administration of  intravenous contrast. Multiplanar reformatted images are provided for review.  COMPARISON:  None available  HISTORY:  Fall  FINDINGS:  BONES/ALIGNMENT: The craniocervical and atlantoaxial junctions are intact.  The odontoid process is intact. Mild reversal of the cervical curvature may  be secondary to patient positioning or muscle spasm. Otherwise normal  alignment of the cervical spine is maintained. Osseous mineralization is  within normal limits. The vertebral body heights and intervertebral disc  spaces are preserved. No fracture or dislocation is identified.  DEGENERATIVE CHANGES: No significant degenerative changes.  SOFT TISSUES: There is no prevertebral soft tissue swelling.  IMPRESSION:  No acute abnormality of the cervical spine.    DICTATED BY: Beaulah Dinning     X-ray L-spine:   EXAMINATION:  3 VIEWS OF THE LUMBAR SPINE  07/19/2014 3:52 pm  COMPARISON:  None available  HISTORY:  Fall  FINDINGS:  Lumbar vertebral  bodies are normal in height and alignment. No evidence of  fracture. Visualized sacrum is unremarkable.  No significant degenerative changes.  IMPRESSION:  Unremarkable examination of the lumbar spine    DICTATED BY: Beaulah Dinning  X-ray T-spine:    EXAMINATION:  3 VIEWS OF THE THORACIC SPINE  07/19/2014 3:53 pm  COMPARISON:  None available  HISTORY:  Fall  FINDINGS:  Thoracic vertebral bodies are normal in height and alignment. Intervertebral  disc spaces are maintained. No significant degenerative changes. No  evidence of fracture. Pedicles are symmetric and intact.  Visualized lungs are clear.  IMPRESSION:  Unremarkable examination of the thoracic spine    DICTATED BY: Beaulah Dinning     X-ray right hip and pelvis:   EXAMINATION:  SINGLE VIEW OF THE RIGHT HIP; SINGLE VIEW OF THE PELVIS  07/19/2014 3:52 pm  COMPARISON:  None available  HISTORY:  Fall  FINDINGS:  There is no evidence of acute fracture. There is normal alignment. No acute  joint abnormality. No focal osseous lesion. No focal soft tissue abnormality.  IMPRESSION:  No acute osseous abnormality.    DICTATED BY: Beaulah Dinning     X-ray right wrist:   EXAMINATION:  3 VIEWS OF THE RIGHT WRIST  07/19/2014 3:52 pm  COMPARISON:  None.  HISTORY:  fall, pain  FINDINGS:  There is no evidence of acute fracture. There is normal alignment. No acute  joint abnormality. No focal osseous lesion. No focal soft tissue abnormality.  IMPRESSION:  No acute osseous abnormality of the right wrist..    DICTATED BY: HUME, DALE     X-ray right elbow:   EXAMINATION:  3 VIEWS OF THE RIGHT ELBOW  07/19/2014 3:50 pm  COMPARISON:  None available  HISTORY:  Fall  FINDINGS:  There is no evidence of acute fracture. There is normal alignment. No acute  joint abnormality. No focal osseous lesion. No focal soft tissue abnormality.  IMPRESSION:  No acute osseous abnormality.    DICTATED BY: Beaulah Dinning     X-ray right shoulder:    EXAMINATION:  3 VIEWS OF THE RIGHT SHOULDER  07/19/2014 3:53  pm  COMPARISON:  None.  HISTORY:  fall, pain  FINDINGS:  There is no acute fracture. There is no acute or focal bony abnormality.  The glenohumeral and acromioclavicular joints are normally located. There is  no radiopaque foreign body.  IMPRESSION:  There is no acute bony abnormality of the right shoulder.    DICTATED BY: HUME, DALE     EKG (if obtained): (All EKG's are interpreted by myself in the absence of a cardiologist)    MDM:  The patient was seen and evaluated by the attending physician, Merrilee Seashore, MD, who agreed with the assessment and plan.    Radiology is negative. Not seeing signs of concussion at this time.    I estimate there is LOW risk for RAPIDLY EXPANDING OR RUPTURED AAA, CAUDA EQUINA SYNDROME, EPIDURAL MASS LESION OR HERNIATED DISK CAUSING SEVERE STENOSIS, thus I consider the discharge disposition reasonable. Charmaine Downs (or their surrogate) and I have discussed the diagnosis and risks, and we agree with discharging home with close follow-up. We also discussed returning to the Emergency Department immediately if new or worsening symptoms occur. We have discussed the symptoms which are most concerning that necessitate immediate return.    Clinical Impression:  1. Arm contusion, right, initial encounter    2. Back contusion, right, initial encounter    3. Acute post-traumatic headache, not intractable    4. Closed head injury, initial encounter      Plan  New Prescriptions    IBUPROFEN (ADVIL;MOTRIN) 800 MG TABLET    Take 1 tablet by mouth every 6  hours as needed for Pain    ORPHENADRINE (NORFLEX) 100 MG ER TABLET    Take 1 tablet by mouth 2 times daily for 10 days    OXYCODONE-ACETAMINOPHEN (PERCOCET) 5-325 MG PER TABLET    Take 1 tablet by mouth every 4 hours as needed for Pain   F/u with PCP in 1 day  D/c home    (Please note that portions of this note may have been completed with a voice recognition program. Efforts were made to edit the dictations but occasionally words are  mis-transcribed.)    Sherre Lain, PA       [place], [place]  07/19/14 [phone]

## 2014-07-19 NOTE — ED Provider Notes (Signed)
As physician-in-triage, I performed a medical screening history and physical exam on this patient.    HISTORY OF PRESENT ILLNESS  Linda Blevins is a 33 y.o. female to the ER with fall off ladder, landed on right side, +LOC. Has low back pain. neck pain, headache, right shoulder and arm pain.. Able to ambulate after.  No tingling or numbness anywhere.     PHYSICAL EXAM  BP 121/85 mmHg   Pulse 70   Temp(Src) 98.6 ??F (37 ??C) (Oral)   Resp 17   Ht  (1.6 m)   Wt 260 lb (117.935 kg)   BMI 46.07 kg/m2   SpO2 96%    On exam, the patient appears well-hydrated, well-nourished, and in no acute distress. Mucous membranes are moist. Speech is clear. Breathing is unlabored.  Skin is dry. Mental status is normal. The patient has normal gait, moves all extremities, and is without facial droop. Morbidly obese. No bruising to head, face, right shoulder tender to touch. Able to move both legs, strength is 5/5 in all 4 extremities. Sensation and reflexe sintact. Alert and oriented times 4.     All diagnostic, treatment, and disposition decisions were made by myself in conjunction with the mid-level provider.  Patient's vital signs are stable.  She was sent down for CT scans of her head and neck as well as imaging.  All within normal limits.  No signs of fractures or injuries.  Did discuss with her that she is going to be achy.  Did receive pain medication here and felt improved.  We'll discharge her with pain meds, rest, follow-up with PCP.  Return ED if worsens.    For all further details of the patient's emergency department visit, please see the mid-level practitioner's documentation.    Comment: Please note this report has been produced using speech recognition software and may contain errors related to that system including errors in grammar, punctuation, and spelling, as well as words and phrases that may be inappropriate. If there are any questions or concerns please feel free to contact the dictating provider for  clarification.      Merrilee Seashore, MD  07/19/14 [phone]

## 2014-07-19 NOTE — ED Notes (Signed)
Patient fell off ladder earlier today/ multiple injuries    Beaulah Corin, RN  07/19/14 [phone]

## 2014-09-19 LAB — CBC WITH AUTO DIFFERENTIAL
Basophils %: 1 %
Basophils Absolute: 0.1 /??L
Eosinophils %: 6 %
Eosinophils Absolute: 0.5 /??L
Hematocrit: 42.4 % (ref 36–46)
Hemoglobin: 14.1 g/dL (ref 12.0–16.0)
Lymphocytes %: 23 %
Lymphocytes Absolute: 2.2 /??L
MCH: 29.8 pg
MCHC: 33.3 g/dL
MCV: 90 fL
Monocytes %: 8 %
Monocytes Absolute: 0.7 /??L
Neutrophils %: 62 %
Neutrophils Absolute: 5.8 /??L
Platelets: 277 K/??L
RBC: 4.73 10^6/??L
RDW: 13.3 %
WBC: 9.3 10^3/mL

## 2014-09-19 LAB — COMPREHENSIVE METABOLIC PANEL
ALT: 19 U/L
AST: 19 U/L
Albumin: 4.2
Alkaline Phosphatase: 54 U/L
BUN: 11 mg/dL
CO2: 24 mmol/L
Calcium: 9.2 mg/dL
Chloride: 102 mmol/L
Creatinine: 0.68
Glucose: 80 mg/dL
Potassium: 4.1 mmol/L
Sodium: 141 mmol/L
Total Bilirubin: 0.2 mg/dL (ref 0.1–1.4)
Total Protein: 6.6

## 2014-09-19 LAB — T3, FREE: T3, Free: 3.7

## 2014-09-19 LAB — T4, FREE: T4 Free: 1

## 2014-09-19 LAB — TSH: TSH: 2.32 u[IU]/mL

## 2015-05-04 MED ORDER — BUPROPION HCL ER (XL) 300 MG PO TB24
300 MG | ORAL_TABLET | ORAL | Status: DC
Start: 2015-05-04 — End: 2015-12-17

## 2015-05-14 ENCOUNTER — Encounter: Attending: Family Medicine | Primary: Registered Nurse

## 2015-09-18 ENCOUNTER — Ambulatory Visit
Admit: 2015-09-18 | Discharge: 2015-09-18 | Payer: PRIVATE HEALTH INSURANCE | Attending: Family | Primary: Registered Nurse

## 2015-09-18 DIAGNOSIS — L258 Unspecified contact dermatitis due to other agents: Secondary | ICD-10-CM

## 2015-09-18 MED ORDER — METHYLPREDNISOLONE ACETATE 80 MG/ML IJ SUSP
80 MG/ML | Freq: Once | INTRAMUSCULAR | Status: AC
Start: 2015-09-18 — End: 2015-09-18
  Administered 2015-09-18: 17:00:00 80 mg via INTRAMUSCULAR

## 2015-09-18 NOTE — Progress Notes (Signed)
SUBJECTIVE:  Linda Blevins   09-16-1981   female   Allergies   Allergen Reactions   ??? Ciprofloxacin    ??? Pcn [Penicillins] Hives   ??? Sulfa Antibiotics Hives       Chief Complaint   Patient presents with   ??? Rash     face      There are no active problems to display for this patient.    HPI   Woke up with rash to right cheek and nose area. Itching. No known irritant contacts.    Past Medical History   Diagnosis Date   ??? Anxiety    ??? Depression    ??? Fatigue    ??? Obesity      Social History     Social History   ??? Marital status: Married     Spouse name: N/A   ??? Number of children: N/A   ??? Years of education: N/A     Occupational History   ??? Not on file.     Social History Main Topics   ??? Smoking status: Never Smoker   ??? Smokeless tobacco: Not on file   ??? Alcohol use No   ??? Drug use: Not on file   ??? Sexual activity: Not on file     Other Topics Concern   ??? Not on file     Social History Narrative     Family History   Problem Relation Age of Onset   ??? Asthma Mother    ??? Hypertension Father      Past Surgical History   Procedure Laterality Date   ??? Hysterectomy     ??? Tonsillectomy     ??? Adenoidectomy       Review of Systems   Constitutional: Negative for fatigue and unexpected weight change.   HENT: Negative for congestion.    Respiratory: Negative for shortness of breath.    Cardiovascular: Negative for chest pain, palpitations and leg swelling.   Gastrointestinal: Negative for abdominal distention, nausea and vomiting.   Musculoskeletal: Negative for back pain and gait problem.   Neurological: Negative for dizziness, weakness and headaches.   Psychiatric/Behavioral: Negative for agitation and sleep disturbance. The patient is not nervous/anxious.      OBJECTIVE:  Visit Vitals   ??? BP 108/68 (Site: Right Arm, Position: Sitting, Cuff Size: Large Adult)   ??? Pulse 76   ??? Ht 5\' 2"  (1.575 m)   ??? Wt 280 lb (127 kg)   ??? SpO2 97%   ??? BMI 51.21 kg/m2     Physical Exam   Constitutional: She appears well-developed and  well-nourished.   HENT:   Head: Normocephalic and atraumatic.   Right Ear: External ear normal.   Left Ear: External ear normal.   Nose: Nose normal.   Mouth/Throat: Oropharynx is clear and moist.   Eyes: Conjunctivae and EOM are normal. Pupils are equal, round, and reactive to light.   Neck: Normal range of motion. Neck supple.   Cardiovascular: Normal rate, regular rhythm, normal heart sounds and intact distal pulses.    Pulmonary/Chest: Effort normal and breath sounds normal.   Abdominal: Soft. Bowel sounds are normal.   Musculoskeletal: Normal range of motion.   Neurological: She is alert. She has normal reflexes.   Skin: Skin is warm and dry.   Psychiatric: She has a normal mood and affect. Her behavior is normal. Judgment and thought content normal.     ASSESSMENT/PLAN:    1. Contact dermatitis due  to other agent  Depomedrol 80 mg IM x 1  Topical anti-itch lotions ok to use  Oral benadryl prn-caution sedation  Handout given with contact dermatitis recommendations    No orders of the defined types were placed in this encounter.    Current Outpatient Prescriptions   Medication Sig Dispense Refill   ??? buPROPion (WELLBUTRIN XL) 300 MG XL tablet TAKE 1 TABLET BY MOUTH ONE TIME A DAY  30 tablet 1   ??? oxyCODONE-acetaminophen (PERCOCET) 5-325 MG per tablet Take 1 tablet by mouth every 4 hours as needed for Pain 15 tablet 0   ??? ibuprofen (ADVIL;MOTRIN) 800 MG tablet Take 1 tablet by mouth every 6 hours as needed for Pain 30 tablet 0     No current facility-administered medications for this visit.      Return if symptoms worsen or fail to improve.    [name] DNP, FNP-C

## 2015-09-18 NOTE — Patient Instructions (Addendum)
Dermatitis: Care Instructions  Your Care Instructions  Dermatitis is the general name used for any rash or inflammation of the skin. Different kinds of dermatitis cause different kinds of rashes. Common causes of a rash include new medicines, plants (such as poison oak or poison ivy), heat, stress, and allergies to soaps, cosmetics, detergents, chemicals, and fabrics. Certain illnesses can also cause a rash. Unless caused by an infection, these rashes cannot be spread from person to person.  How long your rash will last depends on what caused it. Rashes may last a few days or months.  Follow-up care is a key part of your treatment and safety. Be sure to make and go to all appointments, and call your doctor if you are having problems. It's also a good idea to know your test results and keep a list of the medicines you take.  How can you care for yourself at home?  ?? Do not scratch. Cut your nails short, and file them smooth. Or you may wear gloves if this helps keep you from scratching.  ?? Wash the area with water only. Pat dry.  ?? Put cold, wet cloths on the rash to reduce itching.  ?? Keep cool, and stay out of the sun.  ?? Leave the rash open to the air as much as possible.  ?? If the rash itches, use hydrocortisone cream. Follow the directions on the label. Calamine lotion may help for plant rashes.  ?? Try an over-the-counter antihistamine such as diphenhydramine (Benadryl) or chlorpheniramine (Chlor-Trimeton). Follow the directions on the label.  ?? If your doctor prescribed a cream, use it as directed. If your doctor prescribed medicine, take it exactly as directed.  When should you call for help?  Call your doctor now or seek immediate medical care if:  ?? You have signs of infection, such as:  ?? Increased pain, swelling, warmth, or redness.  ?? Red streaks leading from the area.  ?? Pus draining from the area.  ?? A fever.  ?? You have joint pain along with the rash.  Watch closely for changes in your health, and be  sure to contact your doctor if:  ?? Your rash is changing or getting worse.  ?? You are not getting better as expected.  Where can you learn more?  Go to [email or web address] and sign in to your MyChart account. Enter F270 in the Search Health Information box to learn more about ???Dermatitis: Care Instructions.???    If you do not have an account, please click on the ???Sign Up Now??? link.  ?? 2006-2016 Healthwise, Incorporated. Care instructions adapted under license by [facility]. This care instruction is for use with your licensed healthcare professional. If you have questions about a medical condition or this instruction, always ask your healthcare professional. Healthwise, Incorporated disclaims any warranty or liability for your use of this information.  Content Version: 10.9.538570; Current as of: December 26, 2014

## 2015-09-22 ENCOUNTER — Ambulatory Visit
Admit: 2015-09-22 | Discharge: 2015-09-22 | Payer: PRIVATE HEALTH INSURANCE | Attending: Family | Primary: Registered Nurse

## 2015-09-22 DIAGNOSIS — L255 Unspecified contact dermatitis due to plants, except food: Secondary | ICD-10-CM

## 2015-09-22 MED ORDER — CLINDAMYCIN HCL 300 MG PO CAPS
300 MG | ORAL_CAPSULE | Freq: Three times a day (TID) | ORAL | 0 refills | Status: AC
Start: 2015-09-22 — End: 2015-10-02

## 2015-09-22 MED ORDER — METHYLPREDNISOLONE 4 MG PO TBPK
4 MG | PACK | ORAL | 0 refills | Status: DC
Start: 2015-09-22 — End: 2015-12-17

## 2015-09-22 NOTE — Progress Notes (Signed)
SUBJECTIVE:  Linda Blevins   08/08/1981   female   Allergies   Allergen Reactions   ??? Ciprofloxacin    ??? Pcn [Penicillins] Hives   ??? Sulfa Antibiotics Hives       Chief Complaint   Patient presents with   ??? Poison Ivy     face      There are no active problems to display for this patient.    HPI   Returns today for continued rash and itching across cheeks and nose with blistering. Was seen here 3 days ago for same and treated with depo medrol injection. Also using calmoseptin, oral and topical benadryl, calamine lotion, apple cider vinegar. Started after doing a leaf/tree project with her son, who has the same symptoms to a lesser degree.    Past Medical History   Diagnosis Date   ??? Anxiety    ??? Depression    ??? Fatigue    ??? Obesity      Social History     Social History   ??? Marital status: Married     Spouse name: N/A   ??? Number of children: N/A   ??? Years of education: N/A     Occupational History   ??? Not on file.     Social History Main Topics   ??? Smoking status: Never Smoker   ??? Smokeless tobacco: Not on file   ??? Alcohol use No   ??? Drug use: Not on file   ??? Sexual activity: Not on file     Other Topics Concern   ??? Not on file     Social History Narrative     Family History   Problem Relation Age of Onset   ??? Asthma Mother    ??? Hypertension Father      Past Surgical History   Procedure Laterality Date   ??? Hysterectomy     ??? Tonsillectomy     ??? Adenoidectomy       Review of Systems   Constitutional: Negative for fatigue and unexpected weight change.   HENT: Negative for congestion.    Respiratory: Negative for shortness of breath.    Cardiovascular: Negative for chest pain, palpitations and leg swelling.   Gastrointestinal: Negative for abdominal distention, nausea and vomiting.   Musculoskeletal: Negative for back pain and gait problem.   Skin: Positive for rash.        Blisters to nose   Neurological: Negative for dizziness, weakness and headaches.     OBJECTIVE:  Visit Vitals   ??? BP 122/70 (Site: Right Arm,  Position: Sitting, Cuff Size: Large Adult)   ??? Pulse 76   ??? Resp 18   ??? Ht _0  (1.575 m)   ??? Wt 278 lb 12.8 oz (126.5 kg)   ??? SpO2 97%   ??? BMI 50.99 kg/m2     Physical Exam   Constitutional: She is oriented to person, place, and time. She appears well-developed.   Morbidly obese   HENT:   Head: Normocephalic and atraumatic.   Eyes: Conjunctivae and EOM are normal. Pupils are equal, round, and reactive to light.   Neck: Normal range of motion. Neck supple.   Cardiovascular: Normal rate, regular rhythm, normal heart sounds and intact distal pulses.    Pulmonary/Chest: Effort normal and breath sounds normal.   Musculoskeletal: Normal range of motion.   Neurological: She is alert and oriented to person, place, and time.   Skin: Skin is warm and dry. Rash noted.  Dry, erythremic rash across cheeks and nose. A few blisters noted on nose.  Pruritic   Nursing note and vitals reviewed.    ASSESSMENT/PLAN:    1. Contact dermatitis due to plant  Medrol dose pak  Topical ok to use  Benadryl every 6 hours prn itching.    2. Secondary infection of skin  Clindamycin as directed  Keep clean  Return for new or worsening symptoms or any concerns as needed    No orders of the defined types were placed in this encounter.    Current Outpatient Prescriptions   Medication Sig Dispense Refill   ??? methylPREDNISolone (MEDROL, PAK,) 4 MG tablet Take as directed on the pack 1 kit 0   ??? clindamycin (CLEOCIN) 300 MG capsule Take 1 capsule by mouth 3 times daily for 10 days 30 capsule 0   ??? ibuprofen (ADVIL;MOTRIN) 800 MG tablet Take 1 tablet by mouth every 6 hours as needed for Pain 30 tablet 0   ??? buPROPion (WELLBUTRIN XL) 300 MG XL tablet TAKE 1 TABLET BY MOUTH ONE TIME A DAY  30 tablet 1   ??? oxyCODONE-acetaminophen (PERCOCET) 5-325 MG per tablet Take 1 tablet by mouth every 4 hours as needed for Pain 15 tablet 0     No current facility-administered medications for this visit.      Return if symptoms worsen or fail to improve.    [place], FNP-C

## 2015-09-22 NOTE — Patient Instructions (Signed)
Take your medication as directed.  Return for new or worsening symptoms or any concerns as needed.

## 2015-09-24 NOTE — Telephone Encounter (Signed)
From: Charmaine DownsJessica S Lincoln  To: Gala MurdochSue A Carter, CNP  Sent: 09/24/2015 8:31 AM EDT  Subject: Visit Follow-Up Question    Good Morning, As we had discussed when I saw you, my son has something very similar. His is not only on his face though. His are on hotspots such as his neck, armpits, chest and the bend in his arm. I wanted to know since he has seen you in the past if he could see you now as well. I want to confirm that his is not chicken pox. Thank you for your time.     Carley HammedJessica Straka

## 2015-12-17 ENCOUNTER — Ambulatory Visit
Admit: 2015-12-17 | Discharge: 2015-12-17 | Payer: PRIVATE HEALTH INSURANCE | Attending: Family | Primary: Registered Nurse

## 2015-12-17 ENCOUNTER — Encounter

## 2015-12-17 DIAGNOSIS — F3289 Other specified depressive episodes: Secondary | ICD-10-CM

## 2015-12-17 MED ORDER — VILAZODONE HCL 40 MG PO TABS
40 MG | ORAL_TABLET | Freq: Every day | ORAL | 0 refills | Status: DC
Start: 2015-12-17 — End: 2015-12-17

## 2015-12-17 MED ORDER — VILAZODONE HCL 40 MG PO TABS
40 MG | ORAL_TABLET | Freq: Every day | ORAL | 1 refills | Status: DC
Start: 2015-12-17 — End: 2016-05-06

## 2015-12-17 NOTE — Telephone Encounter (Signed)
Pt states 30 day supply was not sent to [facility]. Please send 30 days supply to [facility]

## 2015-12-17 NOTE — Telephone Encounter (Signed)
From: Charmaine Downs  To: [name], CNP  Sent: 12/17/2015 11:35 AM EST  Subject: Prescription Question    The mail pharmacy says the Rx for Viibryd is going to be $186.00. So what is plan B? Thanks for your time.  Linda Blevins

## 2015-12-17 NOTE — Progress Notes (Signed)
SUBJECTIVE:  Linda Blevins   1981-04-23   female   Allergies   Allergen Reactions   ??? Ciprofloxacin    ??? Pcn [Penicillins] Hives   ??? Sulfa Antibiotics Hives       Chief Complaint   Patient presents with   ??? Discuss Medications     Insurance problems      There are no active problems to display for this patient.    HPI   Has been on vibriid recently for depression. It is not working the best for her, and her insurance is no longer covering it for her chronic depression. She has been out of her medication for a couple of weeks. Formerly on paxil, wellbutrin, lexapro, prozac, zoloft without benefit. She reports feeling tired. Works full-time night shift and doesn't get consistent routine sleep. She also reports having mucous in her stools and having chronic loose stools, some bloating.    Past Medical History   Diagnosis Date   ??? Anxiety    ??? Depression    ??? Fatigue    ??? Obesity      Social History     Social History   ??? Marital status: Married     Spouse name: N/A   ??? Number of children: N/A   ??? Years of education: N/A     Occupational History   ??? Not on file.     Social History Main Topics   ??? Smoking status: Never Smoker   ??? Smokeless tobacco: Never Used   ??? Alcohol use No   ??? Drug use: No   ??? Sexual activity: Yes     Partners: Male     Other Topics Concern   ??? Not on file     Social History Narrative     Family History   Problem Relation Age of Onset   ??? Asthma Mother    ??? Hypertension Father      Past Surgical History   Procedure Laterality Date   ??? Hysterectomy     ??? Tonsillectomy     ??? Adenoidectomy       Review of Systems   Constitutional: Positive for fatigue. Negative for unexpected weight change.   Respiratory: Negative for shortness of breath.    Cardiovascular: Negative for chest pain.   Gastrointestinal: Positive for abdominal distention, diarrhea and vomiting. Negative for nausea.   Neurological: Negative for dizziness and headaches.   Psychiatric/Behavioral: Positive for dysphoric mood. Negative for  suicidal ideas.     OBJECTIVE:  Visit Vitals   ??? BP 118/76   ??? Pulse 100   ??? Resp 18   ??? Ht  (1.575 m)   ??? Wt 278 lb (126.1 kg)   ??? SpO2 96%   ??? BMI 50.85 kg/m2     Physical Exam   Constitutional: She is oriented to person, place, and time. She appears well-developed.   Morbidly obese with a BMI of 50.85   HENT:   Head: Normocephalic and atraumatic.   Eyes: Conjunctivae and EOM are normal. Pupils are equal, round, and reactive to light.   Neck: Normal range of motion. Neck supple.   Cardiovascular: Normal rate, regular rhythm and normal heart sounds.    Pulmonary/Chest: Effort normal and breath sounds normal.   Abdominal: Soft. Bowel sounds are normal.   Musculoskeletal: Normal range of motion.   Neurological: She is alert and oriented to person, place, and time.   Skin: Skin is warm and dry.   Psychiatric: She  has a normal mood and affect. Her behavior is normal. Judgment and thought content normal.   Nursing note and vitals reviewed.    ASSESSMENT/PLAN:    1. Lethargy  Work on establishing more routine sleep habits  - COMPREHENSIVE METABOLIC PANEL; Future  - CBC; Future  - TSH WITH REFLEX TO FT4; Future  - VITAMIN D 25 HYDROXY; Future    2. Other depression  Will send 30 day to local pharmacy; 90 day to mail away. Patient will let us know if cost is prohibitive.  - vilazodone HCl (VILAZODONE HCL) 40 MG TABS; Take 1 tablet by mouth daily  Dispense: 30 tablet; Refill: 0  - vilazodone HCl (VILAZODONE HCL) 40 MG TABS; Take 1 tablet by mouth daily  Dispense: 90 tablet; Refill: 1  Recheck in 30 days or sooner if needed for any concerns    3. Diarrhea, unspecified type  Diet as tolerated  - [facility]    Orders Placed This Encounter   Procedures   ??? COMPREHENSIVE METABOLIC PANEL     Standing Status:   Future     Standing Expiration Date:   12/16/2016   ??? CBC     Standing Status:   Future     Standing Expiration Date:   12/16/2016   ??? TSH WITH REFLEX TO FT4     Standing Status:   Future     Standing  Expiration Date:   12/16/2016   ??? VITAMIN D 25 HYDROXY     Standing Status:   Future     Standing Expiration Date:   12/16/2016   ??? [facility] Diet and Nutrition     Referral Priority:   Routine     Referral Type:   Consult for Advice and Opinion     Referral Reason:   Specialty Services Required     Requested Specialty:   Nutrition     Number of Visits Requested:   1     Current Outpatient Prescriptions   Medication Sig Dispense Refill   ??? vilazodone HCl (VILAZODONE HCL) 40 MG TABS Take 1 tablet by mouth daily 30 tablet 0   ??? vilazodone HCl (VILAZODONE HCL) 40 MG TABS Take 1 tablet by mouth daily 90 tablet 1     No current facility-administered medications for this visit.      Return in about 4 weeks (around 01/14/2016) for depression, lethargy.    [name] DNP, FNP-C

## 2015-12-17 NOTE — Patient Instructions (Signed)
Please have your fasting lab work done and we will notify you if any of the results are abnormal.    Your medications have been sent to the pharmacy.

## 2015-12-18 LAB — CBC WITH AUTO DIFFERENTIAL
Basophils %: 0.6 % (ref 0–1)
Basophils Absolute: 0.1 10*3/uL
Eosinophils %: 3.7 % — ABNORMAL HIGH (ref 0–3)
Eosinophils Absolute: 0.3 10*3/uL
Hematocrit: 43.5 % (ref 37–47)
Hemoglobin: 14.4 GM/DL (ref 12.5–16.0)
Immature Neutrophil %: 0.2 % (ref 0–0.43)
Lymphocytes %: 31.2 % (ref 24–44)
Lymphocytes Absolute: 2.5 10*3/uL
MCH: 30.2 PG (ref 27–31)
MCHC: 33.1 % (ref 32.0–36.0)
MCV: 91.2 FL (ref 78–100)
MPV: 10.1 FL (ref 7.5–11.1)
Monocytes %: 8.7 % — ABNORMAL HIGH (ref 0–4)
Monocytes Absolute: 0.7 10*3/uL
Nucleated RBC %: 0 %
Platelets: 303 10*3/uL (ref 140–440)
RBC: 4.77 10*6/uL (ref 4.2–5.4)
RDW: 11.7 % (ref 11.7–14.9)
Segs Absolute: 4.5 10*3/uL
Segs Relative: 55.6 % (ref 36–66)
Total Immature Neutrophil: 0.02 10*3/uL
Total Nucleated RBC: 0 10*3/uL
WBC: 8.1 10*3/uL (ref 4.0–10.5)

## 2015-12-18 LAB — T4, FREE: T4 Free: 1.14 NG/DL (ref 0.9–1.8)

## 2015-12-18 LAB — COMPREHENSIVE METABOLIC PANEL
ALT: 22 U/L (ref 10–40)
AST: 17 IU/L (ref 15–37)
Albumin: 4.5 GM/DL (ref 3.4–5.0)
Alkaline Phosphatase: 58 IU/L (ref 40–129)
Anion Gap: 12 (ref 4–16)
BUN: 16 MG/DL (ref 6–23)
CO2: 25 MMOL/L (ref 21–32)
Calcium: 10.2 MG/DL (ref 8.3–10.6)
Chloride: 98 mMol/L — ABNORMAL LOW (ref 99–110)
Creatinine: 0.8 MG/DL (ref 0.6–1.1)
GFR African American: >60 mL/min/{1.73_m2}
GFR Non-African American: >60 mL/min/{1.73_m2}
Glucose, Fasting: 94 MG/DL (ref 70–99)
Potassium: 4 MMOL/L (ref 3.5–5.1)
Sodium: 135 MMOL/L (ref 135–145)
Total Bilirubin: 0.5 MG/DL (ref 0.0–1.0)
Total Protein: 7.2 GM/DL (ref 6.4–8.2)

## 2015-12-18 LAB — TSH: TSH, High Sensitivity: 4.15 u[IU]/mL (ref 0.270–4.20)

## 2015-12-18 LAB — VITAMIN D 25 HYDROXY: Vit D, 25-Hydroxy: 18.54 NG/ML — ABNORMAL LOW (ref >20)

## 2015-12-21 NOTE — Telephone Encounter (Signed)
From: Charmaine Downs  To: [name], CNP  Sent: 12/19/2015 8:35 AM EST  Subject: Prescription Question    Hello, I have not heard back in regards to my medication. I sent a message on Thursday and called your office on Thursday. Also, I was reviewing my lab results and noticed my monocytes were elevated. I have been told this before by [place] but it was never looked into any further. I would like to look into this. Thanks for your time  Linda Blevins

## 2015-12-22 MED ORDER — VILAZODONE HCL 40 MG PO TABS
40 MG | ORAL_TABLET | Freq: Every day | ORAL | 0 refills | Status: DC
Start: 2015-12-22 — End: 2016-05-06

## 2015-12-22 MED ORDER — VITAMIN D 50 MCG (2000 UT) PO TABS
50 MCG (2000 UT) | ORAL_TABLET | Freq: Every day | ORAL | 11 refills | Status: DC
Start: 2015-12-22 — End: 2016-12-21

## 2016-01-12 ENCOUNTER — Encounter: Attending: Family | Primary: Registered Nurse

## 2016-02-03 ENCOUNTER — Ambulatory Visit: Payer: Self-pay

## 2016-02-22 LAB — BE WELL HEALTH SCREEN
Cholesterol: 167 MG/DL (ref <200)
Glucose: 109 MG/DL (ref 70–140)
HDL: 24 MG/DL — ABNORMAL LOW (ref >40)
LDL Calculated: 105 MG/DL — ABNORMAL HIGH (ref <100)
Triglycerides: 188 MG/DL — ABNORMAL HIGH (ref <150)

## 2016-04-27 ENCOUNTER — Encounter: Attending: Surgery | Primary: Registered Nurse

## 2016-05-06 ENCOUNTER — Ambulatory Visit
Admit: 2016-05-06 | Discharge: 2016-05-06 | Payer: PRIVATE HEALTH INSURANCE | Attending: Family | Primary: Registered Nurse

## 2016-05-06 DIAGNOSIS — E785 Hyperlipidemia, unspecified: Secondary | ICD-10-CM

## 2016-05-06 LAB — POCT GLYCOSYLATED HEMOGLOBIN (HGB A1C): Hemoglobin A1C: 5.2 %

## 2016-05-06 MED ORDER — BUPROPION HCL ER (XL) 300 MG PO TB24
300 MG | ORAL_TABLET | Freq: Every morning | ORAL | 3 refills | Status: DC
Start: 2016-05-06 — End: 2017-06-13

## 2016-05-06 MED ORDER — BUPROPION HCL ER (XL) 300 MG PO TB24
300 MG | ORAL_TABLET | Freq: Every morning | ORAL | 4 refills | Status: DC
Start: 2016-05-06 — End: 2016-09-14

## 2016-05-06 NOTE — Progress Notes (Signed)
SUBJECTIVE:  Linda Blevins   10-21-1981   female   Allergies   Allergen Reactions   ??? Ciprofloxacin    ??? Pcn [Penicillins] Hives   ??? Sulfa Antibiotics Hives   ??? Morphine And Related Nausea And Vomiting       Chief Complaint   Patient presents with   ??? Discuss Labs   ??? Medication Check     change vibred      HPI   Had lab work done recently and here to discuss the results.   Has an appointment with Dr Ward GivensKhouzam next week to discuss weight loss options.    Past Medical History:   Diagnosis Date   ??? Anxiety    ??? Depression    ??? Fatigue    ??? H/O hysterectomy for benign disease 2012    endometriosis   ??? Obesity      Social History     Social History   ??? Marital status: Married     Spouse name: N/A   ??? Number of children: N/A   ??? Years of education: N/A     Occupational History   ??? Not on file.     Social History Main Topics   ??? Smoking status: Never Smoker   ??? Smokeless tobacco: Never Used   ??? Alcohol use No   ??? Drug use: No   ??? Sexual activity: Yes     Partners: Male     Other Topics Concern   ??? Not on file     Social History Narrative     Family History   Problem Relation Age of Onset   ??? Asthma Mother    ??? Hypertension Father      Past Surgical History:   Procedure Laterality Date   ??? ADENOIDECTOMY     ??? HYSTERECTOMY     ??? TONSILLECTOMY         Review of Systems   Constitutional: Negative for fatigue and unexpected weight change.   HENT: Negative for congestion.    Respiratory: Negative for shortness of breath.    Cardiovascular: Negative for chest pain, palpitations and leg swelling.   Gastrointestinal: Negative for abdominal distention, nausea and vomiting.   Musculoskeletal: Negative for back pain and gait problem.   Neurological: Negative for dizziness, weakness and headaches.   Psychiatric/Behavioral: Negative for agitation and sleep disturbance. The patient is not nervous/anxious.      OBJECTIVE:  BP 110/70 (Site: Left Arm, Position: Sitting, Cuff Size: Large Adult)   Pulse 102   Resp 16   Ht 5\' 2"  (1.575 m)   Wt  284 lb 8 oz (129 kg)   SpO2 97%   BMI 52.04 kg/m2  Physical Exam   Constitutional: She is oriented to person, place, and time. She appears well-developed. No distress.   Morbidly obese with a BMI of 52.04   HENT:   Head: Normocephalic and atraumatic.   Nose: Nose normal.   Mouth/Throat: Oropharynx is clear and moist.   Eyes: Conjunctivae are normal. Pupils are equal, round, and reactive to light.   Cardiovascular: Normal rate, regular rhythm and normal heart sounds.    Pulmonary/Chest: Effort normal and breath sounds normal.   Abdominal: Soft. Bowel sounds are normal.   Musculoskeletal: Normal range of motion.   Neurological: She is alert and oriented to person, place, and time.   Skin: Skin is warm and dry.   Psychiatric: She has a normal mood and affect. Her behavior is normal. Judgment and  thought content normal.   Nursing note and vitals reviewed.    ASSESSMENT/PLAN:    1. Hyperglycemia  - POCT glycosylated hemoglobin (Hb A1C) - WNL    2. Morbid Obesity due to excess calories  - keep appointment with bariatric specialist next week   - watch portion size, cut down on carbohydrates, sugar, and dairy     3. Hyperlipidemia  Verbal and written instructions given for diet and lifestyle modifications to reduce cholesterol     Orders Placed This Encounter   Procedures   ??? POCT glycosylated hemoglobin (Hb A1C)     Current Outpatient Prescriptions   Medication Sig Dispense Refill   ??? loratadine (CLARITIN) 10 MG capsule Take 10 mg by mouth daily     ??? Multiple Vitamins-Minerals (THERAPEUTIC MULTIVITAMIN-MINERALS) tablet Take 1 tablet by mouth daily     ??? buPROPion (WELLBUTRIN XL) 300 MG extended release tablet Take 1 tablet by mouth every morning 90 tablet 4   ??? buPROPion (WELLBUTRIN XL) 300 MG extended release tablet Take 1 tablet by mouth every morning 30 tablet 3   ??? Cholecalciferol (VITAMIN D) 2000 UNITS TABS tablet Take 1 tablet by mouth daily 30 tablet 11     No current facility-administered medications for this  visit.      Make an appointment for any concerns     [name] DNP, FNP-C    Return for new or worsening symptoms or any concerns as needed.

## 2016-05-06 NOTE — Patient Instructions (Addendum)
Your medication has been sent to your pharmacy    Return in 1 year or sooner if needed for any concerns         Learning About High Cholesterol  What is high cholesterol?  Cholesterol is a type of fat in your blood. It is needed for many body functions, such as making new cells. Cholesterol is made by your body. It also comes from food you eat.  If you have too much cholesterol, it starts to build up in your arteries. This is called hardening of the arteries, or atherosclerosis. High cholesterol raises your risk of a heart attack and stroke.  There are different types of cholesterol. LDL is the "bad" cholesterol. High LDL can raise your risk for heart disease, heart attack, and stroke. HDL is the "good" cholesterol. High HDL is linked with a lower risk for heart disease, heart attack, and stroke.  Your cholesterol levels help your doctor find out your risk for having a heart attack or stroke.  How can you prevent high cholesterol?  A heart-healthy lifestyle can help you prevent high cholesterol. This lifestyle helps lower your risk for a heart attack and stroke.  ?? Eat heart-healthy foods.  ?? Eat fruits, vegetables, whole grains (like oatmeal), dried beans and peas, nuts and seeds, soy products (like tofu), and fat-free or low-fat dairy products.  ?? Replace butter, margarine, and hydrogenated or partially hydrogenated oils with olive and canola oils. (Canola oil margarine without trans fat is fine.)  ?? Replace red meat with fish, poultry, and soy protein (like tofu).  ?? Limit processed and packaged foods like chips, crackers, and cookies.  ?? Be active. Exercise can improve your cholesterol level. Get at least 30 minutes of exercise on most days of the week. Walking is a good choice. You also may want to do other activities, such as running, swimming, cycling, or playing tennis or team sports.  ?? Stay at a healthy weight. Lose weight if you need to.  ?? Don't smoke. If you need help quitting, talk to your doctor about  stop-smoking programs and medicines. These can increase your chances of quitting for good.  How is high cholesterol treated?  The goal of treatment is to reduce your chances of having a heart attack or stroke. The goal is not to lower your cholesterol numbers only.  ?? You may make lifestyle changes, such as eating healthy foods, not smoking, losing weight, and being more active.  ?? You may have to take medicine.  Follow-up care is a key part of your treatment and safety. Be sure to make and go to all appointments, and call your doctor if you are having problems. It's also a good idea to know your test results and keep a list of the medicines you take.  Where can you learn more?  Go to [email or web address] and sign in to your MyChart account. Enter [phone] in the Search Health Information box to learn more about "Learning About High Cholesterol."     If you do not have an account, please click on the "Sign Up Now" link.  Current as of: December 17, 2014  Content Version: 11.2  ?? 2006-2017 Healthwise, Incorporated. Care instructions adapted under license by [facility]. If you have questions about a medical condition or this instruction, always ask your healthcare professional. Health[employer], Inc[employer] disclaims any warranty or liability for your use of this information.

## 2016-05-18 ENCOUNTER — Ambulatory Visit
Admit: 2016-05-18 | Discharge: 2016-05-18 | Payer: PRIVATE HEALTH INSURANCE | Attending: Surgery | Primary: Registered Nurse

## 2016-05-18 NOTE — Progress Notes (Signed)
Bariatric Surgery Consultation    Hiya, Point, 1981-11-06, 35 y.o.,  female, CSN:   05/18/16     Chief Complaint:    Chief Complaint   Patient presents with   ??? New Patient     1st Wm visit, diet & exercise plan        SUBJECTIVE:    Linda Blevins is a 35 y.o. female being seen for morbid obesity, considering weight loss surgery; [place]'s, Height:  (157.5 cm), Weight: 286 lb 11.2 oz (130 kg), Current Body mass index is 52.44 kg/(m^2).   The patient's PCP is [name]   The patient first recognized that she had a weight problem about 10 years ago. The patient's lowest weight in the last five years was 225 lbs, and the highest weight in the last five years was 286 lbs.     Weight Loss Program History:  The patient states she has tried [facility] [employer].  The most weight lost ever with diet and exercise was 35 Lbs, but unfortunately only kept them of for 1year. These attempts have been temporarily successful with weight loss, but have failed to sustain adequate weight loss.  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 The patient has also tried but failed self directed diet and exercise.    Comorbid Conditions:  Significant diseases affecting this patient are   Past Medical History:   Diagnosis Date   ??? Anxiety    ??? Depression    ??? Fatigue    ??? H/O hysterectomy for benign disease 2012    endometriosis   ??? Obesity    .    Review of Systems - ROS  Otherwise per HPI.    Allergies:  Allergies   Allergen Reactions   ??? Ciprofloxacin    ??? Pcn [Penicillins] Hives   ??? Sulfa Antibiotics Hives   ??? Morphine And Related Nausea And Vomiting       Medications:  Current Outpatient Prescriptions   Medication Sig Dispense Refill   ??? loratadine (CLARITIN) 10 MG capsule Take 10 mg by mouth daily     ??? Multiple Vitamins-Minerals (THERAPEUTIC MULTIVITAMIN-MINERALS)  tablet Take 1 tablet by mouth daily     ??? buPROPion (WELLBUTRIN XL) 300 MG extended release tablet Take 1 tablet by mouth every morning 90 tablet 4   ??? Cholecalciferol (VITAMIN D) 2000 UNITS TABS tablet Take 1 tablet by mouth daily 30 tablet 11   ??? buPROPion (WELLBUTRIN XL) 300 MG extended release tablet Take 1 tablet by mouth every morning 30 tablet 3     No current facility-administered medications for this visit.         Past Surgical History:  Past Surgical History:   Procedure Laterality Date   ??? ADENOIDECTOMY     ??? HYSTERECTOMY     ??? TONSILLECTOMY         Family History:  Family History   Problem Relation Age of Onset   ??? Asthma Mother    ??? Hypertension Father        Social History:  Social History     Social History   ??? Marital status: Married     Spouse name: N/A   ??? Number of children: N/A   ??? Years of education: 44     Occupational History   ???  RN [employer]     Social History Main Topics   ??? Smoking status: Never Smoker   ??? Smokeless tobacco: Never Used   ??? Alcohol use No   ??? Drug use: No   ??? Sexual activity: Yes     Partners: Male     Other Topics Concern   ??? Not on file     Social History Narrative         OBJECTIVE:     Physical Exam   BP 116/88 (Site: Right Arm, Position: Sitting, Cuff Size: Large Adult)   Ht 5\' 2"  (1.575 m)   Wt 286 lb 11.2 oz (130 kg)   Breastfeeding? No   BMI 52.44 kg/m2   Constitutional:  Vital signs are normal. The patient appears well-developed and well-nourished.   HENT:   Head: Normocephalic.   Neck: No mass and no thyromegaly present.   Cardiovascular: Normal rate, regular rhythm, S1 normal and S2 normal.    Pulmonary/Chest: Effort normal and breath sounds normal.   Abdominal: Soft. Normal appearance. There is no organomegaly. No tenderness. There is no rigidity, no rebound, no guarding and no Murphy's sign.   Musculoskeletal:        Right lower leg: Normal. No tenderness and no edema.        Left lower leg: Normal. No tenderness and no edema.   Lymphadenopathy:      No cervical adenopathy.   Neurological: The patient is alert and oriented.   Skin: Skin is warm, dry and intact.   Psychiatric: The patient has a normal mood and affect. Speech is normal and behavior is normal. Judgment and thought content normal. Cognition and memory are normal.     ASSESSMENT & PLAN:    Patient Active Problem List   Diagnosis   ??? Morbid obesity with BMI of 50.0-59.9, adult (HCC)   ??? Anxiety   ??? Reactive depression   ??? Gastroesophageal reflux disease without esophagitis   ??? SUI (stress urinary incontinence, female)   ??? Snores   ??? Arthritis   ??? Primary stabbing headache   ??? Dyslipidemia       5 yrs a Charity fundraiserN; lives with 14 yrs marriage husband also RN (Type I DM) mildly overwt, daughter [id number] , son 33 yo.    The patient is good candidate for surgery.  The patient is interested in the Robotic Sleeve Gastrectomy.  Will continue work up toward surgery.    Counseled extensively regarding:  1) DIET and MONITOR the Weight:  - 1500 Kcal Diet/day.  - Write down everything you eat and drink for 1 wk  - No calories from drinks  - Slim fast diet: 4 cans per day 1-2 days a week with only NO calories drinks those days    2) EXERCISE: 1 hr/day 5 days a week    3) DIETITIAN / NUTRITIONAL CONSULT, evaluation and counseling to proctor healthy diet ~1500 Kcal /day    4) EXERCISE PHYSIOLOGIST CONSULT    5) PSYCHOLOGICAL EVALUATION    6) MONTHLY FOLLOW UP,  to follow and document diet and exercise trial    7) Planning a Sleeve Gastrectomy in few months    8) 2 Pictures were taken today to concretely monitor weight loss over time.    9) CARDIOLOGY OPTIMIZATION AND CLEARANCE BEFORE SURGERY    10) PULMONOLGY OPTIMIZATION AND CLEARANCE BEFORE SURGERY    WILL CHECK BASELINE BARIATRIC COMPREHENSIVE LABS.    Orders Placed This Encounter   Procedures   ???  PT eval and treat   ??? PR NUTRITIONAL COUNSELING, DIET        Pt was handed a food diary notebook to help monitor Cal intake, and patient information pamphlet on Sleeve  Gastrectomy.     More than 50% of the visit was spent in face to face counseling regarding weight loss, appropriate diet and exercise, and healthy eating habits.    - Eat slow, and chew 50-60 times before swallowing  - Eat smaller portions in smaller plates  - Weigh yourself at least 2-3 times a week    The patient will be scheduled for a follow up visit in No Follow-up on file..    Bariatric 1200 calorie diet, NAS, heart healthy, 60-80 gram protein.      ____________________________________________    Estill BakesMagued Kiree Dejarnette, MD, FACS, FICS  Member of the [employer] Society of Metabolic and Bariatric Surgeons    05/18/2016  2:01 PM

## 2016-05-23 ENCOUNTER — Encounter: Primary: Registered Nurse

## 2016-05-26 ENCOUNTER — Encounter: Primary: Registered Nurse

## 2016-05-27 ENCOUNTER — Inpatient Hospital Stay: Primary: Registered Nurse

## 2016-05-31 ENCOUNTER — Ambulatory Visit: Admit: 2016-05-31 | Discharge: 2016-05-31 | Payer: PRIVATE HEALTH INSURANCE | Primary: Registered Nurse

## 2016-05-31 NOTE — Progress Notes (Signed)
Outpatient Nutrition Counseling    REASON FOR VISIT: Initial Nutrition Counseling Pre-Op Bariatric Surgery    Chief Complaint:    Chief Complaint   Patient presents with   ??? 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 Has not started counting calories yet. Eats out frequently, snacks late at night. Does like vegetables, eats good amount of protein. Drinks water and diet soda. Instructed on 1200 calories.  The patient is a 35 y.o. female being seen for morbid obesity, considering weight loss surgery; [place]'s, Height: 5\' 2"  (157.5 cm), Weight: 284 lb 1.6 oz (128.9 kg), Current Body mass index is 51.96 kg/(m^2).   The patient's PCP is Gala MurdochSue A Carter   The patient first recognized that she had a weight problem about 10 years ago. The patient's lowest weight in the last five years was 225 lbs, and the highest weight in the last five years was 286 lbs.     Weight Loss Program History:  The patient states she has tried Weight Watcher.  The most weight lost ever with diet and exercise was 35 Lbs, but unfortunately only kept them of for 1year. These attempts have been temporarily successful with weight loss, but have failed to sustain adequate weight loss.    Fatim's life is significantly affected by weight related to her co-morbidities.     Comorbid Conditions:  Significant diseases affecting this patient are   Past Medical History:   Diagnosis Date   ??? Anxiety    ??? Depression    ??? Fatigue    ??? H/O hysterectomy for benign disease 2012    endometriosis   ??? Obesity    .    Review of Systems - ROS  Otherwise per HPI.    Allergies:  Allergies   Allergen Reactions   ??? Ciprofloxacin    ??? Pcn [Penicillins] Hives   ??? Sulfa Antibiotics Hives   ??? Morphine And Related Nausea And Vomiting       Past Surgical History:  Past Surgical History:   Procedure Laterality Date   ??? ADENOIDECTOMY     ??? HYSTERECTOMY     ??? TONSILLECTOMY         Family History:  Family  History   Problem Relation Age of Onset   ??? Asthma Mother    ??? Hypertension Father        Social History:  Social History     Social History   ??? Marital status: Married     Spouse name: N/A   ??? Number of children: N/A   ??? Years of education: 2814     Occupational History   ??? RN [employer]     Social History Main Topics   ??? Smoking status: Never Smoker   ??? Smokeless tobacco: Never Used   ??? Alcohol use No   ??? Drug use: No   ??? Sexual activity: Yes     Partners: Male     Other Topics Concern   ??? Not on file     Social History Narrative         OBJECTIVE:  Physical Exam   BP 137/74 (Site: Right Arm, Position: Sitting, Cuff Size: Medium Adult)   Pulse 92   Ht 5\' 2"  (1.575 m)   Wt 284 lb 1.6 oz (128.9 kg)   BMI 51.96 kg/m2       NUTRITION DIAGNOSIS: Overweight /  Obesity   Problem: Increased adiposity compared to reference standard or established norms   Etiology: Excess intake compared to output over time   S/S: Ht: 62" Wt: 284.1 lbs BMI: 51.9    NUTRITION INTERVENTIONS:    Individualized treatment goals to address nutrition diagnosis:   Instructed on 1200 kcal diet for weight loss   Provided sample menus, food lists, and meal planning handouts   Encouraged Physical activity as approved by physician    MONITORING/ EVALUATION/ PLAN:   Pt verbalized understanding of all materials covered   Pt asked pertinent questions throughout the session - expect compliance with nutrition guidelines presented   Provided pt with contact information should questions arise prior to next visit   Will f/u with pt in 3-4 months for further education and post-op diet instructions  J. Early Ord MS, RDN, LD  05/31/2016

## 2016-06-16 ENCOUNTER — Ambulatory Visit
Admit: 2016-06-16 | Discharge: 2016-06-16 | Payer: PRIVATE HEALTH INSURANCE | Attending: Surgery | Primary: Registered Nurse

## 2016-06-16 DIAGNOSIS — F419 Anxiety disorder, unspecified: Secondary | ICD-10-CM

## 2016-06-16 NOTE — Progress Notes (Signed)
BARIATRIC SURGERY OFFICE NOTE    SUBJECTIVE:    Patient presenting today referred from [name], for   Chief Complaint   Patient presents with   ??? Weight Management     2nd WM visit.   .    Vitals:    06/16/16 0942   BP: 110/73   Pulse: 85        HPI: Linda Blevins is a 35 y.o. female presenting in second bariatric visit, follow up diet and exercise - pre-operative weight loss, in consideration for bariatric surgery.    BMI: Body mass index is 50.85 kg/(m^2). Obesity Classification: III Morbid Obesity.    Weight History:   Wt Readings from Last 3 Encounters:   06/16/16 278 lb (126.1 kg)   05/31/16 284 lb 1.6 oz (128.9 kg)   05/18/16 286 lb 11.2 oz (130 kg)     Total weight loss/gain -6.1 Lbs over 1 month.      Patient dines out to a sit down restaurant 12 times per month.    Patient eats fast food meals 5 times per month.      Drinks mostly water    24 hour recall/food frequency chart:  Breakfast: protein shake  Snack: none  Lunch: tuna sandwich  Snack: none   Dinner: roast beef, rice, corn  Snack: grapes    Total daily calories: 1200-1400      Exercises walk 30-45 min 2 times a week.   Counting calories with paper and pen!  Cardiac and Pulm eval.  Counseled in length, perfect compliance.    Thoroughly reviewed the patient's medical history, family history, social history and review of systems with the patient today in the office.  Please see medical record for pertinent positives.      Past Medical History:   Diagnosis Date   ??? Anxiety    ??? Depression    ??? Fatigue    ??? H/O hysterectomy for benign disease 2012    endometriosis   ??? Obesity       Past Surgical History:   Procedure Laterality Date   ??? ADENOIDECTOMY     ??? HYSTERECTOMY     ??? TONSILLECTOMY       Current Outpatient Prescriptions   Medication Sig Dispense Refill   ??? loratadine (CLARITIN) 10 MG capsule Take 10 mg by mouth daily     ??? Multiple Vitamins-Minerals (THERAPEUTIC MULTIVITAMIN-MINERALS) tablet Take 1 tablet by mouth daily     ??? buPROPion  (WELLBUTRIN XL) 300 MG extended release tablet Take 1 tablet by mouth every morning 90 tablet 4   ??? buPROPion (WELLBUTRIN XL) 300 MG extended release tablet Take 1 tablet by mouth every morning 30 tablet 3   ??? Cholecalciferol (VITAMIN D) 2000 UNITS TABS tablet Take 1 tablet by mouth daily 30 tablet 11     No current facility-administered medications for this visit.       Allergies   Allergen Reactions   ??? Ciprofloxacin    ??? Pcn [Penicillins] Hives   ??? Sulfa Antibiotics Hives   ??? Morphine And Related Nausea And Vomiting           Review of Systems   Constitutional: Positive for fatigue. Negative for activity change, chills, diaphoresis and fever.   HENT: Negative for sore throat, trouble swallowing and voice change.    Eyes: Negative for photophobia and visual disturbance.   Respiratory: Positive for shortness of breath. Negative for cough and wheezing.    Cardiovascular: Positive for  leg swelling. Negative for chest pain and palpitations.   Gastrointestinal: Positive for abdominal distention and abdominal pain. Negative for anal bleeding, blood in stool, constipation, diarrhea, nausea and vomiting.   Endocrine: Positive for polyphagia. Negative for cold intolerance, heat intolerance, polydipsia and polyuria.   Genitourinary: Positive for urgency. Negative for dysuria, frequency and hematuria.   Musculoskeletal: Positive for arthralgias, back pain and gait problem. Negative for joint swelling, myalgias and neck stiffness.   Skin: Negative for color change and rash.   Neurological: Negative for seizures, speech difficulty, light-headedness and numbness.   Hematological: Negative for adenopathy. Does not bruise/bleed easily.   Psychiatric/Behavioral: Positive for sleep disturbance. The patient is nervous/anxious.        OBJECTIVE:    BP 110/73   Pulse 85   Ht  (1.575 m)   Wt 278 lb (126.1 kg)   Breastfeeding? No   BMI 50.85 kg/m2     Physical Exam   Constitutional: She is oriented to person, place, and time. She  appears well-developed and well-nourished. No distress.   HENT:   Head: Normocephalic and atraumatic.   Eyes: Conjunctivae and EOM are normal. Pupils are equal, round, and reactive to light. No scleral icterus.   Neck: Normal range of motion. No JVD present. No tracheal deviation present. No thyromegaly present.   Cardiovascular: Normal rate, regular rhythm, normal heart sounds and intact distal pulses.  Exam reveals no gallop and no friction rub.    No murmur heard.  Pulmonary/Chest: Effort normal. No stridor. No respiratory distress. She has no wheezes. She has no rales. She exhibits no tenderness.   Abdominal: Soft. Bowel sounds are normal. She exhibits no distension and no mass. There is no tenderness. There is no rebound and no guarding.   Musculoskeletal: Normal range of motion. She exhibits no edema or tenderness.   Lymphadenopathy:     She has no cervical adenopathy.   Neurological: She is alert and oriented to person, place, and time. No cranial nerve deficit. Coordination normal.   Skin: Skin is warm and dry. No rash noted. She is not diaphoretic. No erythema. No pallor.   Psychiatric: Her behavior is normal. Judgment and thought content normal.   Vitals reviewed.      ASSESSMENT & PLAN:    1. Anxiety    2. Arthritis    3. Dyslipidemia    4. Gastroesophageal reflux disease without esophagitis    5. Morbid obesity with BMI of 50.0-59.9, adult (HCC)    6. Primary stabbing headache    7. Reactive depression    8. Snores    9. SUI (stress urinary incontinence, female)    10. Preop cardiovascular exam    11. Preop pulmonary/respiratory exam         Exercises walk 30-45 min 2 times a week.   Counting calories with paper and pen!  Cardiac and Pulm eval.  Counseled in length, perfect compliance.      Patient was encouraged to journal all food intake. Keep calorie level at approximately 1200-1500. Protein intake is to be a minimum of 60-80 grams per day. Water drinking was encouraged with a goal of 64oz-128oz  daily. Beverages to be calorie free except for milk. Every other beverage should be water. They are to avoid soda.   Continue to increase level of physical activity.      More than 50% of the office visit today was spent in face to face counseling regarding diet and exercise, in preparation for her  planned Robotic Sleeve Gastrectomy.  Counting calories, complying with the dietitian's recommendations, and complying with the preoperative workup including the dietitian counseling, exercise physiologist counseling, cardiologist evaluation and pre-operative optimization, pulmonologist evaluation and pre operative optimization, pre operative EGD evaluation.  The patient expressed understanding and willingness to comply nicely; all questions and concerns addressed in details.    Patient counseled on the risks, benefits, and alternatives of treatment plan at length while in the office today. Patient states an understanding and willingness to proceed with the plan.      Orders Placed This Encounter   Procedures   ??? Ambulatory referral to Pulmonology   ??? Amb External Referral To Cardiology        Follow Up:  Return in about 4 weeks (around 07/14/2016) for For imaging and tests results review, Bariatric follow up: diet, exercise & weight loss.      Estill Bakes, MD, FACS, FICS  Member of the [employer] of [employer] and [employer]    [phone]    06/16/16

## 2016-07-01 NOTE — Telephone Encounter (Signed)
Pt is asking for an X-ray of the right knee and a referral to Dr. Everett GraffGalluch office please advise and let pt know

## 2016-07-04 NOTE — Telephone Encounter (Signed)
Pt called and is asking for the order and the referral please advise

## 2016-07-07 ENCOUNTER — Encounter

## 2016-07-07 NOTE — Telephone Encounter (Signed)
I have entered an xray order and done the referral  Please notify patient  Thank you

## 2016-07-07 NOTE — Telephone Encounter (Signed)
What is going on with her knee?  Any injury?  Chronic or acute pain?

## 2016-07-07 NOTE — Telephone Encounter (Signed)
She has been having right knee pain, grinding in one specific spot. She saw the ortho dr and they can take xrays [place], but [place] and [place] this. Her insurance only [facility] so she needs an xray order by PCP per Dr. Deloris PingGalluch's office.

## 2016-07-08 ENCOUNTER — Ambulatory Visit: Admit: 2016-07-08 | Primary: Registered Nurse

## 2016-07-08 ENCOUNTER — Inpatient Hospital Stay: Attending: Family | Primary: Registered Nurse

## 2016-07-08 ENCOUNTER — Encounter

## 2016-07-08 DIAGNOSIS — M25561 Pain in right knee: Secondary | ICD-10-CM

## 2016-07-08 NOTE — Telephone Encounter (Signed)
Spoke with patient who voiced understanding of PCP notes.

## 2016-07-08 NOTE — Telephone Encounter (Signed)
Xray order faxed to central scheduling. Referral faxed to Dr.Galluch's office

## 2016-07-08 NOTE — Telephone Encounter (Signed)
LM on Vm for patient to return call to office.

## 2016-07-11 ENCOUNTER — Encounter: Attending: Family | Primary: Registered Nurse

## 2016-07-11 ENCOUNTER — Ambulatory Visit
Admit: 2016-07-11 | Discharge: 2016-07-11 | Payer: PRIVATE HEALTH INSURANCE | Attending: Family | Primary: Registered Nurse

## 2016-07-11 DIAGNOSIS — Z6841 Body Mass Index (BMI) 40.0 and over, adult: Secondary | ICD-10-CM

## 2016-07-11 NOTE — Progress Notes (Signed)
Patient dines out to a sit down restaurant 10 times per month.    Patient eats fast food meals 6 times per month.      Drinks mostly Water with flavoring     24 hour recall/food frequency chart:  Breakfast: Protein shake   Snack: none  Lunch: chicken caesar salads  Snack: none  Dinner: vegetables & fried rice   Snack: none    Total daily calories: 1200 daily       Exercises  40 min 3  Times per week.

## 2016-07-11 NOTE — Progress Notes (Signed)
BARIATRIC SURGERY OFFICE NOTE    SUBJECTIVE:    Patient presenting today referred from Gala MurdochSue A Carter, for   Chief Complaint   Patient presents with   ??? Weight Management     WM 3rd visit    .    Vitals:    07/11/16 1537   BP: 114/70   Pulse: 73        BMI: Body mass index is 49.18 kg/(m^2). Obesity Classification: III Morbid Obesity.    Weight History:   Wt Readings from Last 3 Encounters:   07/11/16 268 lb 14.4 oz (122 kg)   06/16/16 278 lb (126.1 kg)   05/31/16 284 lb 1.6 oz (128.9 kg)       HPI: Linda Blevins is a 35 y.o. female presenting in third bariatric visit, follow up diet and exercise - pre-operative weight loss, in consideration for bariatric surgery.    Total weight loss/gain -17.8 Lbs over 3 month.      Patient dines out to a sit down restaurant 10 times per month.    Patient eats fast food meals 6 times per month.      Drinks mostly water with flavoring.     24 hour recall/food frequency chart:  Breakfast: Protein shake   Snack: none  Lunch: chicken caesar salads  Snack: none  Dinner: vegetables & fried rice   Snack: none    Total daily calories: 1200 daily       Exercises 40 min 3 times per week.    Doing premier protein shakes for breakfast.   Continues to struggle with sweats and carbs.   She remains very motivated.   Very consistent with calorie counting.   No calories from drinks.   Continues to have significant right knee pain, will be seeing Orthopedic. Recent xray negative.     Thoroughly reviewed the patient's medical history, family history, social history and review of systems with the patient today in the office.  Please see medical record for pertinent positives.      Past Medical History:   Diagnosis Date   ??? Anxiety    ??? Depression    ??? Fatigue    ??? H/O hysterectomy for benign disease 2012    endometriosis   ??? Obesity       Patient Active Problem List   Diagnosis   ??? Morbid obesity with BMI of 50.0-59.9, adult (HCC)   ??? Anxiety   ??? Reactive depression   ??? Gastroesophageal reflux  disease without esophagitis   ??? SUI (stress urinary incontinence, female)   ??? Snores   ??? Arthritis   ??? Primary stabbing headache   ??? Dyslipidemia     Past Surgical History:   Procedure Laterality Date   ??? ADENOIDECTOMY     ??? HYSTERECTOMY     ??? TONSILLECTOMY       Current Outpatient Prescriptions   Medication Sig Dispense Refill   ??? loratadine (CLARITIN) 10 MG capsule Take 10 mg by mouth daily     ??? Multiple Vitamins-Minerals (THERAPEUTIC MULTIVITAMIN-MINERALS) tablet Take 1 tablet by mouth daily     ??? buPROPion (WELLBUTRIN XL) 300 MG extended release tablet Take 1 tablet by mouth every morning 90 tablet 4   ??? buPROPion (WELLBUTRIN XL) 300 MG extended release tablet Take 1 tablet by mouth every morning 30 tablet 3   ??? Cholecalciferol (VITAMIN D) 2000 UNITS TABS tablet Take 1 tablet by mouth daily 30 tablet 11     No current facility-administered  medications for this visit.       Allergies   Allergen Reactions   ??? Ciprofloxacin    ??? Pcn [Penicillins] Hives   ??? Sulfa Antibiotics Hives   ??? Morphine And Related Nausea And Vomiting        Review of Systems   Constitutional: Positive for fatigue. Negative for appetite change and fever.   HENT: Negative for congestion, dental problem, hearing loss, rhinorrhea and trouble swallowing.    Eyes: Negative for pain.   Respiratory: Negative for chest tightness, shortness of breath and wheezing.    Cardiovascular: Negative for chest pain, palpitations and leg swelling.   Gastrointestinal: Negative for abdominal distention, abdominal pain, diarrhea and nausea.   Endocrine: Negative for cold intolerance and polydipsia.   Genitourinary: Negative for difficulty urinating and frequency.   Musculoskeletal: Positive for arthralgias, back pain, joint swelling and myalgias. Negative for gait problem.        Significant right knee pain.    Skin: Negative for rash.   Allergic/Immunologic: Negative for environmental allergies.   Neurological: Negative for dizziness, seizures and syncope.    Hematological: Does not bruise/bleed easily.   Psychiatric/Behavioral: Negative for behavioral problems and suicidal ideas. The patient is nervous/anxious (Controlled on wellbutrin daily. ).      OBJECTIVE:    BP 114/70 (Site: Left Arm, Position: Sitting, Cuff Size: Large Adult)   Pulse 73   Ht 5\' 2"  (1.575 m)   Wt 268 lb 14.4 oz (122 kg)   Breastfeeding? No   BMI 49.18 kg/m2     Physical Exam   Constitutional: She is oriented to person, place, and time. She appears well-developed and well-nourished.   Obese   HENT:   Head: Normocephalic and atraumatic.   Right Ear: Hearing and ear canal normal.   Left Ear: Hearing and ear canal normal.   Nose: Nose normal.   Mouth/Throat: Uvula is midline and oropharynx is clear and moist.   Eyes: Conjunctivae are normal. Pupils are equal, round, and reactive to light.   Neck: Normal range of motion.   Cardiovascular: Normal rate, regular rhythm and normal heart sounds.    Pulmonary/Chest: Effort normal and breath sounds normal. She has no decreased breath sounds. She has no wheezes. She has no rhonchi. She has no rales.   Abdominal: Soft. Bowel sounds are normal. There is no tenderness.   Musculoskeletal: Normal range of motion. She exhibits no tenderness.   In all 4 extremities. Right knee limping at times.    Neurological: She is alert and oriented to person, place, and time. She has normal strength. She exhibits normal muscle tone. GCS eye subscore is 4. GCS verbal subscore is 5. GCS motor subscore is 6.   Skin: Skin is warm and dry. No rash noted.   Psychiatric: She has a normal mood and affect. Her behavior is normal. Judgment normal.   Nursing note and vitals reviewed.       FINDINGS:   The bone mineralization is within normal limits. ??The joint spaces appear   unremarkable. ??No acute fractures or dislocations are seen. ??There is no soft   tissue swelling. No bony erosions are seen.   ??   ??   Impression   1. ??No acute osseous abnormality involving the right knee.   ??      Reviewed in detail.     ASSESSMENT & PLAN:    1. Morbid obesity with BMI of 50.0-59.9, adult (HCC)  - Continue to count calories daily.   -  Goal of 1-2 pounds per week- exceeding.   - Discussed alternatives to carbs/sweets.   - Follow up in 1 month.     2. Gastroesophageal reflux disease without esophagitis  - Will be further evaluated with EGD in the future.   - Discussed weight loss and diet control.     3. Anxiety  - Needs psych optimization.   - Anxiety and depression controlled, continue Wellbutrin daily.   - Follow up with any worsening symptoms.   - Amb External Referral To Psychology    4. SUI (stress urinary incontinence, female)  - Continue weight loss and Kegel exercises.   - Follows closely with PCP.     5. Arthritis  - Worsening right knee pain.   - Recent xray of right knee reviewed with patient.   - Negative for degenerative changes or fractures.   - Seeing orthopedic next week.   - Continue weight loss, first recommendation.     6. Dyslipidemia  - Has improved and stable at this time.   - Continue weight loss and will follow through program.     7. Snores  - Seeing pulmonology next week, needs OSA ruled out.   - Continue weight loss and will follow with pulmonary for surgery optimization as well.     8. Pre-op evaluation  - Amb External Referral To Psychology     Patient was encouraged to journal all food intake. Keep calorie level at approximately 1200-1500. Protein intake is to be a minimum of 40-50 grams per day. Water drinking was encouraged with a goal of 64oz-128oz daily. Beverages to be calorie free except for milk and avoid soda. Continue to increase level of physical activity.    I spent 25 minutes with the patient face to face today and over 50% of the office visit today was spent in face to face counseling regarding diet and exercise, in preparation for her planned Robotic Sleeve Gastrectomy.  Discussed in length complying with the dietary recommendations, complying with the  preoperative workup including dietary counseling completed, exercise physiologist counseling completed, and pre-operative optimization of pulmonologist pending and cardiologist pending at this time.  The patient expressed understanding and willingness to comply nicely; all questions and concerns addressed.    No orders of the defined types were placed in this encounter.    Orders Placed This Encounter   Procedures   ??? Amb External Referral To Psychology     Referral Priority:   Routine     Referral Reason:   Specialty Services Required     Referred to Provider:   Dessie ComaJohn P Layh     Requested Specialty:   Psychology     Number of Visits Requested:   1       Follow Up:  Return in about 1 month (around 08/11/2016).    Linda PhenesKAYLENE A BAKER, CNP

## 2016-07-13 ENCOUNTER — Ambulatory Visit
Admit: 2016-07-13 | Discharge: 2016-07-13 | Payer: PRIVATE HEALTH INSURANCE | Attending: Pulmonary Disease | Primary: Registered Nurse

## 2016-07-13 ENCOUNTER — Encounter

## 2016-07-13 ENCOUNTER — Inpatient Hospital Stay: Attending: Pulmonary Disease | Primary: Registered Nurse

## 2016-07-13 ENCOUNTER — Ambulatory Visit: Admit: 2016-07-13 | Primary: Registered Nurse

## 2016-07-13 DIAGNOSIS — R0602 Shortness of breath: Secondary | ICD-10-CM

## 2016-07-13 DIAGNOSIS — R0683 Snoring: Secondary | ICD-10-CM

## 2016-07-13 NOTE — Progress Notes (Addendum)
Subjective:   CHIEF COMPLAINT / HPI:  35 year old female here for clearance for sleeve resection. She denies any respiratory complaints. She has loud snoring. She does not feel fresh when wakes up and off and on feels tired and fatigue. She has weight gain of 100 lbs in past 10 years      Past Medical History:  Past Medical History:   Diagnosis Date   ??? Anxiety    ??? Depression    ??? Fatigue    ??? H/O hysterectomy for benign disease 2012    endometriosis   ??? Obesity        Past Surgical History:        Procedure Laterality Date   ??? ADENOIDECTOMY     ??? HYSTERECTOMY     ??? TONSILLECTOMY         Current Medications:    No current facility-administered medications for this visit.     Allergies   Allergen Reactions   ??? Ciprofloxacin    ??? Pcn [Penicillins] Hives   ??? Sulfa Antibiotics Hives   ??? Morphine And Related Nausea And Vomiting       Social History:    Social History     Social History   ??? Marital status: Married     Spouse name: N/A   ??? Number of children: N/A   ??? Years of education: 6114     Occupational History   ??? RN [employer]     Social History Main Topics   ??? Smoking status: Never Smoker   ??? Smokeless tobacco: Never Used   ??? Alcohol use No   ??? Drug use: No   ??? Sexual activity: Yes     Partners: Male     Other Topics Concern   ??? None     Social History Narrative       Family History:   Family History   Problem Relation Age of Onset   ??? Asthma Mother    ??? Hypertension Father        Immunization:    There is no immunization history on file for this patient.      REVIEW OF SYSTEMS:    CONSTITUTIONAL:  negative for fevers, chills, diaphoresis, activity change, appetite change, fatigue, night sweats and unexpected weight change.   EYES:  negative for blurred vision, eye discharge, visual disturbance and icterus  HEENT:  negative for hearing loss, tinnitus, ear drainage, sinus pressure, nasal congestion, epistaxis and snoring  RESPIRATORY:  See HPI  CARDIOVASCULAR:  negative for chest pain, palpitations,  exertional chest pressure/discomfort, edema, syncope  GASTROINTESTINAL:  negative for nausea, vomiting, diarrhea, constipation, blood in stool and abdominal pain  GENITOURINARY:  negative for frequency, dysuria and hematuria  HEMATOLOGIC/LYMPHATIC:  negative for easy bruising, bleeding and lymphadenopathy  ALLERGIC/IMMUNOLOGIC:  negative for recurrent infections, angioedema, anaphylaxis and drug reactions  ENDOCRINE:  negative for weight changes and diabetic symptoms including polyuria, polydipsia and polyphagia    MUSCULOSKELETAL:  negative for  pain, joint swelling, decreased range of motion and muscle weakness  NEUROLOGICAL:  negative for headaches, slurred speech, unilateral weakness  PSYCHIATRIC/BEHAVIORAL: negative for hallucinations, behavioral problems, confusion and agitation.     Objective:   PHYSICAL EXAM:      VITALS:    Vitals:    07/13/16 0959   Pulse: 76   Resp: 16   SpO2: 96%   Weight: 268 lb (121.6 kg)   Height: 5\' 2"  (1.575 m)  CONSTITUTIONAL:  awake, alert, cooperative, no apparent distress, and appears stated age  NECK:  Supple, symmetrical, trachea midline, no adenopathy, thyroid symmetric, not enlarged and no tenderness  CHEST: Chest expansion equal and symmetrical, no intercostal retraction.  LUNGS:  no increased work of breathing, has expiratory wheezes both lungs, no crackles.  CARDIOVASCULAR: S1 and S2, no edema and no JVD  ABDOMEN:  normal bowel sounds, non-distended and no masses palpated, and no tenderness to palpation. No hepatospleenomegaly  LYMPHADENOPATHY:  no axillary or supraclavicular adenopathy. No cervical adnenopathy  PSYCHIATRIC: Oriented to person place and time. No obvious depression or anxiety.  MUSCULOSKELETAL: No obvious misalignment or effusion of the joints. No clubbing, cyanosis of the digits.  RIGHT AND LEFT LOWER EXTREMITIES: No edema, no inflammation, no tenderness.  SKIN:  normal skin color, texture, turgor and no redness, warmth, or swelling. No palpable  nodules    DATA:                Neck 16  assesment total 4        Assessment:     1. Snoring/wt gain r/o osa  2. No s/s of pulm disease  3. Needs clearance for gastric bypass          Plan:     1. D/w pt  2. cpm  3. Schedule home sleep study  4. rtc 2 months

## 2016-07-20 ENCOUNTER — Encounter: Attending: Pulmonary Disease | Primary: Registered Nurse

## 2016-07-22 ENCOUNTER — Inpatient Hospital Stay: Attending: Pulmonary Disease | Primary: Registered Nurse

## 2016-07-22 DIAGNOSIS — G931 Anoxic brain damage, not elsewhere classified: Secondary | ICD-10-CM

## 2016-07-22 DIAGNOSIS — Z8614 Personal history of Methicillin resistant Staphylococcus aureus infection: Secondary | ICD-10-CM

## 2016-07-22 HISTORY — DX: Personal history of Methicillin resistant Staphylococcus aureus infection: Z86.14

## 2016-07-22 HISTORY — DX: Anoxic brain damage, not elsewhere classified: G93.1

## 2016-08-05 ENCOUNTER — Inpatient Hospital Stay: Payer: Medicaid Other

## 2016-08-05 ENCOUNTER — Emergency Department: Payer: Medicaid Other

## 2016-08-05 ENCOUNTER — Inpatient Hospital Stay (HOSPITAL_COMMUNITY): Payer: Medicaid Other

## 2016-08-05 ENCOUNTER — Inpatient Hospital Stay
Admission: EM | Admit: 2016-08-05 | Discharge: 2016-08-12 | DRG: 917 | Disposition: A | Discharge status: Rehabilitation Facility | Payer: Medicaid Other | Attending: Internal Medicine | Admitting: Internal Medicine

## 2016-08-05 ENCOUNTER — Encounter: Payer: Self-pay | Admitting: Radiology

## 2016-08-05 DIAGNOSIS — T405X1A Poisoning by cocaine, accidental (unintentional), initial encounter: Secondary | ICD-10-CM | POA: Diagnosis present

## 2016-08-05 DIAGNOSIS — R451 Restlessness and agitation: Secondary | ICD-10-CM | POA: Diagnosis not present

## 2016-08-05 DIAGNOSIS — Z833 Family history of diabetes mellitus: Secondary | ICD-10-CM | POA: Diagnosis not present

## 2016-08-05 DIAGNOSIS — A419 Sepsis, unspecified organism: Secondary | ICD-10-CM

## 2016-08-05 DIAGNOSIS — G931 Anoxic brain damage, not elsewhere classified: Secondary | ICD-10-CM | POA: Diagnosis present

## 2016-08-05 DIAGNOSIS — R570 Cardiogenic shock: Secondary | ICD-10-CM | POA: Diagnosis present

## 2016-08-05 DIAGNOSIS — G9340 Encephalopathy, unspecified: Secondary | ICD-10-CM

## 2016-08-05 DIAGNOSIS — R778 Other specified abnormalities of plasma proteins: Secondary | ICD-10-CM

## 2016-08-05 DIAGNOSIS — Z823 Family history of stroke: Secondary | ICD-10-CM

## 2016-08-05 DIAGNOSIS — R6520 Severe sepsis without septic shock: Secondary | ICD-10-CM

## 2016-08-05 DIAGNOSIS — F17210 Nicotine dependence, cigarettes, uncomplicated: Secondary | ICD-10-CM | POA: Diagnosis present

## 2016-08-05 DIAGNOSIS — G92 Toxic encephalopathy: Secondary | ICD-10-CM | POA: Diagnosis present

## 2016-08-05 DIAGNOSIS — J45909 Unspecified asthma, uncomplicated: Secondary | ICD-10-CM | POA: Diagnosis present

## 2016-08-05 DIAGNOSIS — J9601 Acute respiratory failure with hypoxia: Secondary | ICD-10-CM | POA: Diagnosis present

## 2016-08-05 DIAGNOSIS — R402432 Glasgow coma scale score 3-8, at arrival to emergency department: Secondary | ICD-10-CM | POA: Diagnosis present

## 2016-08-05 DIAGNOSIS — R569 Unspecified convulsions: Secondary | ICD-10-CM | POA: Diagnosis present

## 2016-08-05 DIAGNOSIS — K7200 Acute and subacute hepatic failure without coma: Secondary | ICD-10-CM | POA: Diagnosis present

## 2016-08-05 DIAGNOSIS — E876 Hypokalemia: Secondary | ICD-10-CM | POA: Diagnosis present

## 2016-08-05 DIAGNOSIS — R4189 Other symptoms and signs involving cognitive functions and awareness: Secondary | ICD-10-CM

## 2016-08-05 DIAGNOSIS — B9562 Methicillin resistant Staphylococcus aureus infection as the cause of diseases classified elsewhere: Secondary | ICD-10-CM | POA: Diagnosis not present

## 2016-08-05 DIAGNOSIS — F1410 Cocaine abuse, uncomplicated: Secondary | ICD-10-CM

## 2016-08-05 DIAGNOSIS — E872 Acidosis, unspecified: Secondary | ICD-10-CM

## 2016-08-05 DIAGNOSIS — R6521 Severe sepsis with septic shock: Secondary | ICD-10-CM

## 2016-08-05 DIAGNOSIS — R7989 Other specified abnormal findings of blood chemistry: Secondary | ICD-10-CM

## 2016-08-05 DIAGNOSIS — R945 Abnormal results of liver function studies: Secondary | ICD-10-CM

## 2016-08-05 DIAGNOSIS — F1910 Other psychoactive substance abuse, uncomplicated: Secondary | ICD-10-CM | POA: Diagnosis present

## 2016-08-05 DIAGNOSIS — T50904A Poisoning by unspecified drugs, medicaments and biological substances, undetermined, initial encounter: Secondary | ICD-10-CM

## 2016-08-05 DIAGNOSIS — M6281 Muscle weakness (generalized): Secondary | ICD-10-CM

## 2016-08-05 DIAGNOSIS — J0410 Acute tracheitis without obstruction: Secondary | ICD-10-CM | POA: Diagnosis not present

## 2016-08-05 DIAGNOSIS — J9600 Acute respiratory failure, unspecified whether with hypoxia or hypercapnia: Secondary | ICD-10-CM | POA: Diagnosis present

## 2016-08-05 HISTORY — DX: Unspecified asthma, uncomplicated: J45.909

## 2016-08-05 LAB — EXPECTORATED SPUTUM ASSESSMENT W REFEX TO RESP CULTURE

## 2016-08-05 LAB — URINALYSIS COMPLETE WITH MICROSCOPIC (ARMC ONLY)
BILIRUBIN URINE: NEGATIVE
GLUCOSE, UA: 50 mg/dL — AB
Ketones, ur: NEGATIVE mg/dL
LEUKOCYTES UA: NEGATIVE
Nitrite: NEGATIVE
Protein, ur: >500 mg/dL — AB
Specific Gravity, Urine: 1.016 (ref 1.005–1.030)
pH: 6 (ref 5.0–8.0)

## 2016-08-05 LAB — CBC WITH DIFFERENTIAL/PLATELET
Basophils Absolute: 0 10*3/uL (ref 0–0.1)
Basophils Relative: 0 %
Eosinophils Absolute: 0 10*3/uL (ref 0–0.7)
Eosinophils Relative: 0 %
HEMATOCRIT: 29.8 % — AB (ref 35.0–47.0)
HEMOGLOBIN: 9.5 g/dL — AB (ref 12.0–16.0)
LYMPHS ABS: 0.6 10*3/uL — AB (ref 1.0–3.6)
LYMPHS PCT: 4 %
MCH: 30.5 pg (ref 26.0–34.0)
MCHC: 31.8 g/dL — AB (ref 32.0–36.0)
MCV: 95.9 fL (ref 80.0–100.0)
MONO ABS: 0.9 10*3/uL (ref 0.2–0.9)
MONOS PCT: 7 %
NEUTROS ABS: 11.4 10*3/uL — AB (ref 1.4–6.5)
Neutrophils Relative %: 89 %
Platelets: 141 10*3/uL — ABNORMAL LOW (ref 150–440)
RBC: 3.1 MIL/uL — ABNORMAL LOW (ref 3.80–5.20)
RDW: 13.5 % (ref 11.5–14.5)
WBC: 12.9 10*3/uL — ABNORMAL HIGH (ref 3.6–11.0)

## 2016-08-05 LAB — BLOOD GAS, ARTERIAL
ALLENS TEST (PASS/FAIL): POSITIVE — AB
ALLENS TEST (PASS/FAIL): POSITIVE — AB
Acid-base deficit: 8.8 mmol/L — ABNORMAL HIGH (ref 0.0–2.0)
Acid-base deficit: 9.5 mmol/L — ABNORMAL HIGH (ref 0.0–2.0)
BICARBONATE: 16.7 mmol/L — AB (ref 20.0–28.0)
Bicarbonate: 17.1 mmol/L — ABNORMAL LOW (ref 20.0–28.0)
Drawn by: 18746
FIO2: 0.5
FIO2: 35
LHR: 18 {breaths}/min
MECHANICAL RATE: 18
MECHVT: 450 mL
O2 Saturation: 98.1 %
O2 Saturation: 98.5 %
PATIENT TEMPERATURE: 37
PCO2 ART: 34 mmHg (ref 32.0–48.0)
PCO2 ART: 39 mmHg (ref 32.0–48.0)
PEEP: 5 cmH2O
PO2 ART: 117 mmHg — AB (ref 83.0–108.0)
Patient temperature: 37
pH, Arterial: 7.3 — ABNORMAL LOW (ref 7.350–7.450)
pH, Arterial: 7.25 — ABNORMAL LOW (ref 7.350–7.450)
pO2, Arterial: 130 mmHg — ABNORMAL HIGH (ref 83.0–108.0)

## 2016-08-05 LAB — MRSA PCR SCREENING: MRSA BY PCR: POSITIVE — AB

## 2016-08-05 LAB — COMPREHENSIVE METABOLIC PANEL
ALK PHOS: 36 U/L — AB (ref 38–126)
ALT: 395 U/L — ABNORMAL HIGH (ref 14–54)
ANION GAP: 12 (ref 5–15)
AST: 319 U/L — ABNORMAL HIGH (ref 15–41)
Albumin: 3.4 g/dL — ABNORMAL LOW (ref 3.5–5.0)
BILIRUBIN TOTAL: 0.7 mg/dL (ref 0.3–1.2)
BUN: 17 mg/dL (ref 6–20)
CALCIUM: 7.2 mg/dL — AB (ref 8.9–10.3)
CO2: 21 mmol/L — ABNORMAL LOW (ref 22–32)
Chloride: 106 mmol/L (ref 101–111)
Creatinine, Ser: 1.82 mg/dL — ABNORMAL HIGH (ref 0.44–1.00)
GFR calc Af Amer: 41 mL/min — ABNORMAL LOW (ref >60)
GFR, EST NON AFRICAN AMERICAN: 35 mL/min — AB (ref >60)
Glucose, Bld: 78 mg/dL (ref 65–99)
POTASSIUM: 5.5 mmol/L — AB (ref 3.5–5.1)
Sodium: 139 mmol/L (ref 135–145)
TOTAL PROTEIN: 6.6 g/dL (ref 6.5–8.1)

## 2016-08-05 LAB — URINE DRUG SCREEN, QUALITATIVE (ARMC ONLY)
AMPHETAMINES, UR SCREEN: POSITIVE — AB
BARBITURATES, UR SCREEN: NOT DETECTED
BENZODIAZEPINE, UR SCRN: POSITIVE — AB
Cannabinoid 50 Ng, Ur ~~LOC~~: NOT DETECTED
Cocaine Metabolite,Ur ~~LOC~~: POSITIVE — AB
MDMA (Ecstasy)Ur Screen: NOT DETECTED
METHADONE SCREEN, URINE: NOT DETECTED
OPIATE, UR SCREEN: POSITIVE — AB
PHENCYCLIDINE (PCP) UR S: NOT DETECTED
Tricyclic, Ur Screen: NOT DETECTED

## 2016-08-05 LAB — GLUCOSE, CAPILLARY
GLUCOSE-CAPILLARY: 86 mg/dL (ref 65–99)
Glucose-Capillary: 97 mg/dL (ref 65–99)

## 2016-08-05 LAB — POCT PREGNANCY, URINE: PREG TEST UR: NEGATIVE

## 2016-08-05 LAB — SALICYLATE LEVEL

## 2016-08-05 LAB — TROPONIN I: TROPONIN I: 0.83 ng/mL — AB (ref <0.03)

## 2016-08-05 LAB — ACETAMINOPHEN LEVEL: Acetaminophen (Tylenol), Serum: <10 ug/mL — ABNORMAL LOW (ref 10–30)

## 2016-08-05 LAB — LACTIC ACID, PLASMA
LACTIC ACID, VENOUS: 3.6 mmol/L — AB (ref 0.5–1.9)
LACTIC ACID, VENOUS: 5.4 mmol/L — AB (ref 0.5–1.9)

## 2016-08-05 LAB — TRIGLYCERIDES: Triglycerides: 101 mg/dL (ref <150)

## 2016-08-05 LAB — ETHANOL

## 2016-08-05 MED ORDER — SODIUM CHLORIDE 0.9% FLUSH: 3.0000 mL | INTRAVENOUS | Status: DC | PRN

## 2016-08-05 MED ORDER — SODIUM CHLORIDE 0.9% FLUSH
3.0000 mL | Freq: Two times a day (BID) | INTRAVENOUS | Status: DC
  Administered 2016-08-05 – 2016-08-09 (×7): 3 mL via INTRAVENOUS
  Administered 2016-08-09: 10 mL via INTRAVENOUS
  Administered 2016-08-10 (×2): 3 mL via INTRAVENOUS
  Administered 2016-08-11: 10 mL via INTRAVENOUS

## 2016-08-05 MED ORDER — SODIUM CHLORIDE 0.9 % IV SOLN
250.0000 mL | INTRAVENOUS | Status: DC | PRN
  Administered 2016-08-05: 500 mL via INTRAVENOUS

## 2016-08-05 MED ORDER — CALCIUM GLUCONATE 10 % IV SOLN
1.0000 g | Freq: Once | INTRAVENOUS | Status: AC
  Administered 2016-08-05: 1 g via INTRAVENOUS

## 2016-08-05 MED ORDER — MIDAZOLAM HCL 2 MG/2ML IJ SOLN: 2.0000 mg | INTRAMUSCULAR | Status: DC | PRN

## 2016-08-05 MED ORDER — SODIUM CHLORIDE 0.9 % IV SOLN: 250.0000 mL | INTRAVENOUS | Status: DC | PRN

## 2016-08-05 MED ORDER — ENOXAPARIN SODIUM 40 MG/0.4ML ~~LOC~~ SOLN
40.0000 mg | SUBCUTANEOUS | Status: DC
  Administered 2016-08-05 – 2016-08-11 (×7): 40 mg via SUBCUTANEOUS
  Filled 2016-08-05 (×8): qty 0.4

## 2016-08-05 MED ORDER — IOPAMIDOL (ISOVUE-300) INJECTION 61%
80.0000 mL | Freq: Once | INTRAVENOUS | Status: AC | PRN
  Administered 2016-08-05: 80 mL via INTRAVENOUS

## 2016-08-05 MED ORDER — FAMOTIDINE IN NACL 20-0.9 MG/50ML-% IV SOLN
20.0000 mg | Freq: Two times a day (BID) | INTRAVENOUS | Status: DC
  Administered 2016-08-05 – 2016-08-11 (×13): 20 mg via INTRAVENOUS
  Filled 2016-08-05 (×13): qty 50

## 2016-08-05 MED ORDER — FAMOTIDINE IN NACL 20-0.9 MG/50ML-% IV SOLN
INTRAVENOUS | Status: AC
  Filled 2016-08-05: qty 100

## 2016-08-05 MED ORDER — ACETAMINOPHEN 325 MG PO TABS: 650.0000 mg | ORAL_TABLET | ORAL | Status: DC | PRN

## 2016-08-05 MED ORDER — PROPOFOL 1000 MG/100ML IV EMUL
5.0000 ug/kg/min | INTRAVENOUS | Status: DC
  Administered 2016-08-05: 5 ug/kg/min via INTRAVENOUS
  Filled 2016-08-05: qty 100

## 2016-08-05 MED ORDER — NOREPINEPHRINE BITARTRATE 1 MG/ML IV SOLN: 0.0000 ug/min | Freq: Once | INTRAVENOUS | Status: DC

## 2016-08-05 MED ORDER — MIDAZOLAM HCL 5 MG/5ML IJ SOLN
1.0000 mg | Freq: Once | INTRAMUSCULAR | Status: AC
  Administered 2016-08-05: 1 mg via INTRAVENOUS

## 2016-08-05 MED ORDER — VASOPRESSIN 20 UNIT/ML IV SOLN
0.4000 [IU] | Freq: Once | INTRAVENOUS | Status: DC
  Filled 2016-08-05: qty 0.02

## 2016-08-05 MED ORDER — SODIUM CHLORIDE 0.9 % IV BOLUS (SEPSIS)
1000.0000 mL | Freq: Once | INTRAVENOUS | Status: AC
  Administered 2016-08-05: 1000 mL via INTRAVENOUS

## 2016-08-05 MED ORDER — NOREPINEPHRINE 4 MG/250ML-% IV SOLN
0.0000 ug/min | INTRAVENOUS | Status: DC
  Administered 2016-08-05: 32 ug/min via INTRAVENOUS
  Administered 2016-08-05: 4 ug/min via INTRAVENOUS
  Filled 2016-08-05 (×2): qty 250

## 2016-08-05 MED ORDER — FAMOTIDINE IN NACL 20-0.9 MG/50ML-% IV SOLN: 20.0000 mg | Freq: Two times a day (BID) | INTRAVENOUS | Status: DC

## 2016-08-05 MED ORDER — ORAL CARE MOUTH RINSE
15.0000 mL | Freq: Two times a day (BID) | OROMUCOSAL | Status: DC
  Administered 2016-08-05 (×2): 15 mL via OROMUCOSAL

## 2016-08-05 MED ORDER — SODIUM BICARBONATE 8.4 % IV SOLN
INTRAVENOUS | Status: DC | PRN
  Administered 2016-08-05: 50 meq via INTRAVENOUS

## 2016-08-05 MED ORDER — ENOXAPARIN SODIUM 40 MG/0.4ML ~~LOC~~ SOLN
SUBCUTANEOUS | Status: AC
  Filled 2016-08-05: qty 0.4

## 2016-08-05 MED ORDER — NOREPINEPHRINE 4 MG/250ML-% IV SOLN
INTRAVENOUS | Status: AC
  Administered 2016-08-05: 32 ug/min via INTRAVENOUS
  Filled 2016-08-05: qty 250

## 2016-08-05 MED ORDER — FENTANYL 2500MCG IN NS 250ML (10MCG/ML) PREMIX INFUSION
INTRAVENOUS | Status: AC
  Administered 2016-08-05: 50 ug/h via INTRAVENOUS
  Filled 2016-08-05: qty 250

## 2016-08-05 MED ORDER — SODIUM CHLORIDE 0.9 % IV SOLN
1.0000 mg/h | INTRAVENOUS | Status: DC
  Filled 2016-08-05: qty 10

## 2016-08-05 MED ORDER — FENTANYL CITRATE (PF) 100 MCG/2ML IJ SOLN: 100.0000 ug | INTRAMUSCULAR | Status: DC | PRN

## 2016-08-05 MED ORDER — SODIUM CHLORIDE 0.9 % IV SOLN
0.0400 [IU]/min | INTRAVENOUS | Status: DC
  Administered 2016-08-05: 0.03 [IU]/min via INTRAVENOUS
  Administered 2016-08-05: 0.04 [IU]/min via INTRAVENOUS
  Filled 2016-08-05 (×3): qty 2

## 2016-08-05 MED ORDER — PIPERACILLIN-TAZOBACTAM 3.375 G IVPB 30 MIN
3.3750 g | Freq: Once | INTRAVENOUS | Status: AC
  Administered 2016-08-05: 3.375 g via INTRAVENOUS
  Filled 2016-08-05: qty 50

## 2016-08-05 MED ORDER — CHLORHEXIDINE GLUCONATE 0.12 % MT SOLN
15.0000 mL | Freq: Two times a day (BID) | OROMUCOSAL | Status: DC
  Administered 2016-08-05 – 2016-08-08 (×7): 15 mL via OROMUCOSAL
  Filled 2016-08-05 (×2): qty 15

## 2016-08-05 MED ORDER — NOREPINEPHRINE BITARTRATE 1 MG/ML IV SOLN
0.0000 ug/min | INTRAVENOUS | Status: DC
  Administered 2016-08-05: 32 ug/min via INTRAVENOUS
  Filled 2016-08-05 (×3): qty 16

## 2016-08-05 MED ORDER — CALCIUM GLUCONATE 10 % IV SOLN
INTRAVENOUS | Status: AC
  Filled 2016-08-05: qty 10

## 2016-08-05 MED ORDER — ONDANSETRON HCL 4 MG/2ML IJ SOLN: 4.0000 mg | Freq: Four times a day (QID) | INTRAMUSCULAR | Status: DC | PRN

## 2016-08-05 MED ORDER — FENTANYL 2500MCG IN NS 250ML (10MCG/ML) PREMIX INFUSION
50.0000 ug/h | INTRAVENOUS | Status: DC
  Administered 2016-08-05: 50 ug/h via INTRAVENOUS

## 2016-08-05 MED ORDER — MIDAZOLAM HCL 2 MG/2ML IJ SOLN
2.0000 mg | INTRAMUSCULAR | Status: DC | PRN
  Administered 2016-08-06: 2 mg via INTRAVENOUS
  Filled 2016-08-05: qty 2

## 2016-08-05 MED ORDER — ETOMIDATE 2 MG/ML IV SOLN
INTRAVENOUS | Status: DC | PRN
  Administered 2016-08-05: 20 mg via INTRAVENOUS

## 2016-08-05 MED ORDER — ROCURONIUM BROMIDE 50 MG/5ML IV SOLN
INTRAVENOUS | Status: DC | PRN
  Administered 2016-08-05: 100 mg via INTRAVENOUS

## 2016-08-05 MED ORDER — VANCOMYCIN HCL IN DEXTROSE 1-5 GM/200ML-% IV SOLN
1000.0000 mg | Freq: Once | INTRAVENOUS | Status: AC
  Administered 2016-08-05: 1000 mg via INTRAVENOUS
  Filled 2016-08-05: qty 200

## 2016-08-05 MED ORDER — FENTANYL CITRATE (PF) 100 MCG/2ML IJ SOLN
100.0000 ug | INTRAMUSCULAR | Status: DC | PRN
  Administered 2016-08-08: 100 ug via INTRAVENOUS
  Filled 2016-08-05: qty 2

## 2016-08-05 NOTE — Consult Note (Signed)
Reason for Consult: not waking up Referring Physician: Dr. Belia Heman  CC: pt found down   HPI: Martha Lawson is an 35 y.o. female with multiple substance abuse found unresponsive at home by her husband. Utox positive for cocaine, benzodiazepines, opiates and amphetemines. Found to be in respiratory failure and s/p intubation.   CTH no acute abnormality    Past Medical History:  Diagnosis Date  . Asthma     History reviewed. No pertinent surgical history.  History reviewed. No pertinent family history.  Social History:  reports that she has been smoking.  She uses smokeless tobacco. She reports that she drinks alcohol. She reports that she uses drugs.  Allergies  Allergen Reactions  . Aspirin     Medications: I have reviewed the patient's current medications.  ROS: Not able to obtain   Physical Examination: Blood pressure 102/72, pulse (!) 102, temperature 99.5 F (37.5 C), resp. rate 18, height 5\' 5"  (1.651 m), weight 105 kg (231 lb 7.7 oz), SpO2 100 %.  Pt does not follow commands No withdrawal from painful stimuli Weak pupils and corneals present.    Laboratory Studies:   Basic Metabolic Panel:  Recent Labs Lab 08/05/16 0736  NA 139  K 5.5*  CL 106  CO2 21*  GLUCOSE 78  BUN 17  CREATININE 1.82*  CALCIUM 7.2*    Liver Function Tests:  Recent Labs Lab 08/05/16 0736  AST 319*  ALT 395*  ALKPHOS 36*  BILITOT 0.7  PROT 6.6  ALBUMIN 3.4*   No results for input(s): LIPASE, AMYLASE in the last 168 hours. No results for input(s): AMMONIA in the last 168 hours.  CBC:  Recent Labs Lab 08/05/16 0736  WBC 12.9*  NEUTROABS 11.4*  HGB 9.5*  HCT 29.8*  MCV 95.9  PLT 141*    Cardiac Enzymes:  Recent Labs Lab 08/05/16 0736  TROPONINI 0.83*    BNP: Invalid input(s): POCBNP  CBG:  Recent Labs Lab 08/05/16 0725 08/05/16 1211  GLUCAP 86 97    Microbiology: Results for orders placed or performed during the hospital encounter of 08/05/16   Blood culture (routine x 2)     Status: None (Preliminary result)   Collection Time: 08/05/16  9:12 AM  Result Value Ref Range Status   Specimen Description BLOOD CENTRAL LINE  Final   Special Requests BOTTLES DRAWN AEROBIC AND ANAEROBIC  4CC  Final   Culture NO GROWTH < 12 HOURS  Final   Report Status PENDING  Incomplete  Blood culture (routine x 2)     Status: None (Preliminary result)   Collection Time: 08/05/16  9:12 AM  Result Value Ref Range Status   Specimen Description BLOOD RIGHT ARM  Final   Special Requests BOTTLES DRAWN AEROBIC AND ANAEROBIC  5CC  Final   Culture NO GROWTH < 12 HOURS  Final   Report Status PENDING  Incomplete  MRSA PCR Screening     Status: Abnormal   Collection Time: 08/05/16 12:25 PM  Result Value Ref Range Status   MRSA by PCR POSITIVE (A) NEGATIVE Final    Comment:        The GeneXpert MRSA Assay (FDA approved for NASAL specimens only), is one component of a comprehensive MRSA colonization surveillance program. It is not intended to diagnose MRSA infection nor to guide or monitor treatment for MRSA infections. RESULT CALLED TO, READ BACK BY AND VERIFIED WITH: RACHEL VERDI 08/05/16 1348 SGD   Culture, expectorated sputum-assessment     Status:  None   Collection Time: 08/05/16  1:32 PM  Result Value Ref Range Status   Specimen Description EXPECTORATED SPUTUM  Final   Special Requests NONE  Final   Sputum evaluation THIS SPECIMEN IS ACCEPTABLE FOR SPUTUM CULTURE  Final   Report Status 08/05/2016 FINAL  Final    Coagulation Studies: No results for input(s): LABPROT, INR in the last 72 hours.  Urinalysis:  Recent Labs Lab 08/05/16 0736  COLORURINE YELLOW*  LABSPEC 1.016  PHURINE 6.0  GLUCOSEU 50*  HGBUR 3+*  BILIRUBINUR NEGATIVE  KETONESUR NEGATIVE  PROTEINUR >500*  NITRITE NEGATIVE  LEUKOCYTESUR NEGATIVE    Lipid Panel:  No results found for: CHOL, TRIG, HDL, CHOLHDL, VLDL, LDLCALC  HgbA1C: No results found for:  HGBA1C  Urine Drug Screen:     Component Value Date/Time   LABOPIA POSITIVE (A) 08/05/2016 0736   COCAINSCRNUR POSITIVE (A) 08/05/2016 0736   LABBENZ POSITIVE (A) 08/05/2016 0736   AMPHETMU POSITIVE (A) 08/05/2016 0736   THCU NONE DETECTED 08/05/2016 0736   LABBARB NONE DETECTED 08/05/2016 0736    Alcohol Level:  Recent Labs Lab 08/05/16 0736  ETH <5    Other results: EKG: normal EKG, normal sinus rhythm, unchanged from previous tracings.  Imaging: Dg Abdomen 1 View  Result Date: 08/05/2016 CLINICAL DATA:  Bedside orogastric tube placement. EXAM: ABDOMEN - 1 VIEW COMPARISON:  None. FINDINGS: OG tube tip in the body of the stomach. Moderate gaseous distention of the stomach. Bowel-gas pattern otherwise unremarkable without evidence of obstruction. IMPRESSION: 1. OG tube tip in the body of the stomach. 2. Moderate gaseous distention of the stomach. No acute abdominal abnormality otherwise. Electronically Signed   By: Hulan Saas M.D.   On: 08/05/2016 08:12   Ct Head Wo Contrast  Result Date: 08/05/2016 CLINICAL DATA:  Unresponsive.  Found down. EXAM: CT HEAD WITHOUT CONTRAST CT CERVICAL SPINE WITHOUT CONTRAST TECHNIQUE: Multidetector CT imaging of the head and cervical spine was performed following the standard protocol without intravenous contrast. Multiplanar CT image reconstructions of the cervical spine were also generated. COMPARISON:  Head CT 05/22/2014 FINDINGS: CT HEAD FINDINGS Brain: The ventricles are normal in size and configuration. No extra-axial fluid collections are identified. The gray-white differentiation is normal. No CT findings for acute intracranial process such as hemorrhage or infarction. No mass lesions. The brainstem and cerebellum are grossly normal. Vascular: No hyperdense vessel or unexpected calcification. Skull: Normal. Negative for fracture or focal lesion. Sinuses/Orbits: Minimal mucoperiosteal thickening involving the right half of the sphenoid sinus.  Otherwise the paranasal sinuses and mastoid air cells are clear. The globes are intact. Other: No scalp lesion or hematoma. CT CERVICAL SPINE FINDINGS Alignment: Grossly normal.  Exam limited by motion artifact. Skull base and vertebrae: Grossly normal. Soft tissues and spinal canal: No abnormal prevertebral soft tissue swelling. An endotracheal tube and NG tube place. Disc levels: No spinal canal compromise. No large disc protrusions. Upper chest: Scattered areas noted in the neck. No definite pneumothorax. Other: None IMPRESSION: No acute intracranial findings or skull fracture. Limited C-spine by motion artifact but no obvious fracture or malalignment. Electronically Signed   By: Rudie Meyer M.D.   On: 08/05/2016 10:06   Ct Cervical Spine Wo Contrast  Result Date: 08/05/2016 CLINICAL DATA:  Unresponsive.  Found down. EXAM: CT HEAD WITHOUT CONTRAST CT CERVICAL SPINE WITHOUT CONTRAST TECHNIQUE: Multidetector CT imaging of the head and cervical spine was performed following the standard protocol without intravenous contrast. Multiplanar CT image reconstructions of the cervical  spine were also generated. COMPARISON:  Head CT 05/22/2014 FINDINGS: CT HEAD FINDINGS Brain: The ventricles are normal in size and configuration. No extra-axial fluid collections are identified. The gray-white differentiation is normal. No CT findings for acute intracranial process such as hemorrhage or infarction. No mass lesions. The brainstem and cerebellum are grossly normal. Vascular: No hyperdense vessel or unexpected calcification. Skull: Normal. Negative for fracture or focal lesion. Sinuses/Orbits: Minimal mucoperiosteal thickening involving the right half of the sphenoid sinus. Otherwise the paranasal sinuses and mastoid air cells are clear. The globes are intact. Other: No scalp lesion or hematoma. CT CERVICAL SPINE FINDINGS Alignment: Grossly normal.  Exam limited by motion artifact. Skull base and vertebrae: Grossly normal.  Soft tissues and spinal canal: No abnormal prevertebral soft tissue swelling. An endotracheal tube and NG tube place. Disc levels: No spinal canal compromise. No large disc protrusions. Upper chest: Scattered areas noted in the neck. No definite pneumothorax. Other: None IMPRESSION: No acute intracranial findings or skull fracture. Limited C-spine by motion artifact but no obvious fracture or malalignment. Electronically Signed   By: Rudie Meyer M.D.   On: 08/05/2016 10:06   Ct Abdomen Pelvis W Contrast  Result Date: 08/05/2016 CLINICAL DATA:  Unresponsive. EXAM: CT ABDOMEN AND PELVIS WITH CONTRAST TECHNIQUE: Multidetector CT imaging of the abdomen and pelvis was performed using the standard protocol following bolus administration of intravenous contrast. CONTRAST:  80mL ISOVUE-300 IOPAMIDOL (ISOVUE-300) INJECTION 61% COMPARISON:  None. FINDINGS: Lower chest: No acute abnormality. Hepatobiliary: There is diffuse hepatic steatosis. There is relative hypertrophy of the lateral segment of left lobe and caudate lobe. No focal liver abnormality noted. Ascites is identified around the right lateral margin of the liver. There is pericholecystic fluid noted. Pancreas: Unremarkable. No pancreatic ductal dilatation or surrounding inflammatory changes. Spleen: Normal in size without focal abnormality. Adrenals/Urinary Tract: The adrenal glands are normal. There are small bilateral renal cysts identified. No mass or hydronephrosis. The urinary bladder appears collapsed around a Foley catheter balloon. Stomach/Bowel: The stomach is normal. Nasogastric tube tip is in the body of the stomach. The small bowel loops have a normal course and caliber. The appendix is visualized and appears normal. Normal appearance of the colon. Vascular/Lymphatic: Normal appearance of the abdominal aorta. No enlarged retroperitoneal or mesenteric adenopathy. No enlarged pelvic or inguinal lymph nodes. Reproductive: Uterus and bilateral adnexa  are unremarkable. Other: There is soft tissue edema within the upper abdomen in the region of the porta hepatis. Small amount of ascites identified. Musculoskeletal: There is no aggressive lytic or sclerotic bone lesions identified. IMPRESSION: 1. Hepatic steatosis is identified as well as perihepatic and pericholecystic ascites. 2. No evidence for abdominal or pelvic abscess or evidence of bowel obstruction. Electronically Signed   By: Signa Kell M.D.   On: 08/05/2016 10:21   Dg Chest Portable 1 View  Result Date: 08/05/2016 CLINICAL DATA:  Bedside central venous catheter placement. EXAM: PORTABLE CHEST 1 VIEW 8:36 a.m.: COMPARISON:  Portable chest x-ray earlier this morning at 7:48 a.m. FINDINGS: A right jugular central venous catheter tip projects over the lower SVC. No evidence of pneumothorax or mediastinal hematoma. Endotracheal tube and OG tube unchanged in position. External pacing pads. Lungs remain clear. Decompression of the stomach since the earlier examination. IMPRESSION: 1. Right jugular central venous catheter tip projects over the lower SVC. No acute complicating features. 2. Remaining support apparatus satisfactory. 3.  No acute cardiopulmonary disease. Electronically Signed   By: Hulan Saas M.D.   On: 08/05/2016  08:48   Dg Chest Portable 1 View  Result Date: 08/05/2016 CLINICAL DATA:  Intubation. EXAM: PORTABLE CHEST 1 VIEW COMPARISON:  None. FINDINGS: Endotracheal tube tip in satisfactory position projecting approximately 3 cm above the carina. OG tube tip courses below the diaphragm into the stomach. Cardiomediastinal silhouette unremarkable for AP portable technique. Lungs clear. Bronchovascular markings normal. Pulmonary vascularity normal. No visible pleural effusions. No pneumothorax. External pacing pads noted. IMPRESSION: 1. Endotracheal tube tip in satisfactory position projecting approximately 3 cm above the carina. 2.  No acute cardiopulmonary disease. Electronically  Signed   By: Hulan Saashomas  Lawrence M.D.   On: 08/05/2016 08:13     Assessment/Plan:  35 y.o. female with multiple substance abuse found unresponsive at home by her husband. Utox positive for cocaine, benzodiazepines, opiates and amphetemines. Found to be in respiratory failure and s/p intubation.   CTH no acute abnormality   Very guarded prognosis at this time EEG running at this point and lots of B activity due to Benzo use but no seizures MRI of brain after EEG looking for anoxia in the basal ganglia specifically caudate/putamen/thalamus which are mostly seen in anoxia from cocaine and opiate use.    08/05/2016, 2:00 PM

## 2016-08-05 NOTE — ED Notes (Signed)
Patient arrive on Non-rebreather mask. Dr. Roxan Hockeyobinson at bedside performing jaw thrust to open airway

## 2016-08-05 NOTE — Progress Notes (Signed)
Foley catheter with temperature probe removed for MRI and new 1214fr catheter placed.

## 2016-08-05 NOTE — Progress Notes (Signed)
Neurology:  Discussion with husband MRI findings and prognosis: Pt has bilateral globus pallidus Injury.  Globus pallidus is part of the basal ganglia and functions to assist in voluntary movements Via dopamine projection. With lack of dopamine or injury it becomes difficulty to regulate planned movements. ( parkinsonian type)  She is fairly young so I believe there will be recovery and possibly extubatable but not completely back to her own self.  Globus pallidus specifically bilateral injury is seen from carbon monoxide poisoning or chronic  Cocaine use.    Pt will likely improve but will have psychomotor deficits that includes persistent motor deficits that can be permanent. There might be loss of control and stiffness in her extremities specifically lower extremities.    There have been number of reports unfortunately similar to this case bumplished in 2007,2009, 2014.     At this time guarded prognosis Vent support Supportive care from primary team.    This patient is critically ill with anoxic brain injury. Critical care time spent 30 minutes.

## 2016-08-05 NOTE — ED Triage Notes (Signed)
[facility]. Patient found unresponsive by husband, unknown down time. Per patient husband, patient went out last night and had been drinking, unsure of drug use. Husband found patient with snoring respiration and foaming at the mouth.   Patient was given 2 MG IM Narcan by EMS, patient also given 2 mg Narcan IV.

## 2016-08-05 NOTE — Progress Notes (Signed)
EEG being preformed.

## 2016-08-05 NOTE — Progress Notes (Signed)
MRI results back and Dr. Belia HemanKasa updating family on results.

## 2016-08-05 NOTE — Progress Notes (Addendum)
Patient accepted from ED on vent.  Assessement complete upon arrival to ICU room 19.  Unable to orient patient to room due to critical condition.

## 2016-08-05 NOTE — Progress Notes (Signed)
Dr. Belia HemanKasa at bedside talking with family.

## 2016-08-05 NOTE — ED Provider Notes (Signed)
[facility] Emergency Department Provider Note    First MD Initiated Contact with Patient 08/05/16 [phone]     (approximate)  I have reviewed the triage vital signs and the nursing notes.   HISTORY  Chief Complaint Altered Mental Status    HPI Martha Lawson is a 35 y.o. female presents unresponsive after being found down by husband. Per EMS report the patient was last seen normal at 12 PM last night. She was out drinking with friends. Was found by her husband laying in bed with snoring respirations and unresponsive.  EMS was called and gave 4 mg of Narcan without any improvement. Blood sugar was greater than 80. She had no purposeful movement. Initial exam was with pinpoint pupils. No evidence of coingestions or trauma at the scene.   PMH: substance abuse reported  Patient Active Problem List   Diagnosis Date Noted  . Respiratory failure (HCC) 08/05/2016    No past surgical history on file.  Unable to assess - unresponsive  Prior to Admission medications   Not on File    Allergies Review of patient's allergies indicates not on file.  No family history on file. - unable to assess - unresponsive  Social History Social History  Substance Use Topics  . Smoking status: Not on file  . Smokeless tobacco: Not on file  . Alcohol use Not on file    Review of Systems Patient denies headaches, rhinorrhea, blurry vision, numbness, shortness of breath, chest pain, edema, cough, abdominal pain, nausea, vomiting, diarrhea, dysuria, fevers, rashes or hallucinations unless otherwise stated above in HPI. ____________________________________________   PHYSICAL EXAM:  VITAL SIGNS: Vitals:   08/05/16 0915 08/05/16 0917  BP: (!) 64/38 (!) 66/46  Pulse:    Resp: (!) 23 (!) 24  Temp: (!) 100.8 F (38.2 C) (!) 100.8 F (38.2 C)    Constitutional: Patient arrives in acute respiratory distress, unresponsive GCS of 4 Eyes: Conjunctivae are normal. Pupils  equal 3 mm and minimally reactive Head: Atraumatic. Nose: No congestion/rhinnorhea. Mouth/Throat: Mucous membranes are moist.  Oropharynx non-erythematous. Neck: No stridor.  Hematological/Lymphatic/Immunilogical: No cervical lymphadenopathy. Cardiovascular tachycardic, regular rhythm. Grossly normal heart sounds. Slow cap refill Respiratory: Normal respiratory effort.  No retractions. Lungs CTAB. Gastrointestinal: Soft and nontender. No distention. No abdominal bruits. No CVA tenderness. Musculoskeletal: No lower extremity tenderness nor edema.  No joint effusions. Neurologic:  GCS of 4 (2,1,1) Skin:  Skin is warm, dry and intact. No rash noted.   ____________________________________________   LABS (all labs ordered are listed, but only abnormal results are displayed)  Results for orders placed or performed during the hospital encounter of 08/05/16 (from the past 24 hour(s))  Glucose, capillary     Status: None   Collection Time: 08/05/16  7:25 AM  Result Value Ref Range   Glucose-Capillary 86 65 - 99 mg/dL  CBC with Differential     Status: Abnormal   Collection Time: 08/05/16  7:36 AM  Result Value Ref Range   WBC 12.9 (H) 3.6 - 11.0 K/uL   RBC 3.10 (L) 3.80 - 5.20 MIL/uL   Hemoglobin 9.5 (L) 12.0 - 16.0 g/dL   HCT 96.0 (L) 45.4 - 09.8 %   MCV 95.9 80.0 - 100.0 fL   MCH 30.5 26.0 - 34.0 pg   MCHC 31.8 (L) 32.0 - 36.0 g/dL   RDW 11.9 14.7 - 82.9 %   Platelets 141 (L) 150 - 440 K/uL   Neutrophils Relative % 89 %  Neutro Abs 11.4 (H) 1.4 - 6.5 K/uL   Lymphocytes Relative 4 %   Lymphs Abs 0.6 (L) 1.0 - 3.6 K/uL   Monocytes Relative 7 %   Monocytes Absolute 0.9 0.2 - 0.9 K/uL   Eosinophils Relative 0 %   Eosinophils Absolute 0.0 0 - 0.7 K/uL   Basophils Relative 0 %   Basophils Absolute 0.0 0 - 0.1 K/uL  Comprehensive metabolic panel     Status: Abnormal   Collection Time: 08/05/16  7:36 AM  Result Value Ref Range   Sodium 139 135 - 145 mmol/L   Potassium 5.5 (H) 3.5  - 5.1 mmol/L   Chloride 106 101 - 111 mmol/L   CO2 21 (L) 22 - 32 mmol/L   Glucose, Bld 78 65 - 99 mg/dL   BUN 17 6 - 20 mg/dL   Creatinine, Ser 1.61 (H) 0.44 - 1.00 mg/dL   Calcium 7.2 (L) 8.9 - 10.3 mg/dL   Total Protein 6.6 6.5 - 8.1 g/dL   Albumin 3.4 (L) 3.5 - 5.0 g/dL   AST 096 (H) 15 - 41 U/L   ALT 395 (H) 14 - 54 U/L   Alkaline Phosphatase 36 (L) 38 - 126 U/L   Total Bilirubin 0.7 0.3 - 1.2 mg/dL   GFR calc non Af Amer 35 (L) >60 mL/min   GFR calc Af Amer 41 (L) >60 mL/min   Anion gap 12 5 - 15  Troponin I     Status: Abnormal   Collection Time: 08/05/16  7:36 AM  Result Value Ref Range   Troponin I 0.83 (HH) <0.03 ng/mL  Urine Drug Screen, Qualitative ([facility] only)     Status: Abnormal   Collection Time: 08/05/16  7:36 AM  Result Value Ref Range   Tricyclic, Ur Screen NONE DETECTED NONE DETECTED   Amphetamines, Ur Screen POSITIVE (A) NONE DETECTED   MDMA (Ecstasy)Ur Screen NONE DETECTED NONE DETECTED   Cocaine Metabolite,Ur [place] POSITIVE (A) NONE DETECTED   Opiate, Ur Screen POSITIVE (A) NONE DETECTED   Phencyclidine (PCP) Ur S NONE DETECTED NONE DETECTED   Cannabinoid 50 Ng, Ur [place] NONE DETECTED NONE DETECTED   Barbiturates, Ur Screen NONE DETECTED NONE DETECTED   Benzodiazepine, Ur Scrn POSITIVE (A) NONE DETECTED   Methadone Scn, Ur NONE DETECTED NONE DETECTED  Ethanol     Status: None   Collection Time: 08/05/16  7:36 AM  Result Value Ref Range   Alcohol, Ethyl (B) <5 <5 mg/dL  Acetaminophen level     Status: Abnormal   Collection Time: 08/05/16  7:36 AM  Result Value Ref Range   Acetaminophen (Tylenol), Serum <10 (L) 10 - 30 ug/mL  Lactic acid, plasma     Status: Abnormal   Collection Time: 08/05/16  7:36 AM  Result Value Ref Range   Lactic Acid, Venous 3.6 (HH) 0.5 - 1.9 mmol/L  Salicylate level     Status: None   Collection Time: 08/05/16  7:36 AM  Result Value Ref Range   Salicylate Lvl <4.0 2.8 - 30.0 mg/dL  Pregnancy, urine POC     Status: None    Collection Time: 08/05/16  7:49 AM  Result Value Ref Range   Preg Test, Ur NEGATIVE NEGATIVE  Blood gas, arterial     Status: Abnormal   Collection Time: 08/05/16  7:56 AM  Result Value Ref Range   FIO2 0.50    Delivery systems VENTILATOR    Mode ASSIST CONTROL    VT 450 mL  LHR 18 resp/min   Peep/cpap 5.0 cm H20   pH, Arterial 7.30 (L) 7.350 - 7.450   pCO2 arterial 34 32.0 - 48.0 mmHg   pO2, Arterial 117 (H) 83.0 - 108.0 mmHg   Bicarbonate 16.7 (L) 20.0 - 28.0 mmol/L   Acid-base deficit 8.8 (H) 0.0 - 2.0 mmol/L   O2 Saturation 98.1 %   Patient temperature 37.0    Collection site RIGHT RADIAL    Drawn by [phone]    Sample type ARTERIAL DRAW    Allens test (pass/fail) POSITIVE (A) PASS   ____________________________________________  EKG My review and personal interpretation at Time: 7:27    Indication: unresponsive  Rate:  130  Rhythm: sinus Axis: normal Other: non specific ST changes in lateral leads with wandering baeline, no STEMI ____________________________________________  RADIOLOGY  pending ____________________________________________   PROCEDURES  Procedure(s) performed: yes .Intubation Date/Time: 08/05/2016 9:27 AM Performed by: Willy EddyOBINSON, Kaled Allende Authorized by: Willy EddyOBINSON, Zamzam Whinery   Consent:    Consent obtained:  Emergent situation   Consent given by:  Healthcare agent Pre-procedure details:    Patient status:  Unresponsive   Mallampati score:  II   Pretreatment meds: etomidate.   Paralytics:  Rocuronium Procedure details:    Preoxygenation:  Nonrebreather mask   CPR in progress: no     Intubation method:  Oral   Oral intubation technique:  Video-assisted   Laryngoscope blade:  Mac 3   Tube size (mm):  8.0   Tube type:  Cuffed   Number of attempts:  1   Ventilation between attempts: no     Cricoid pressure: yes     Tube visualized through cords: yes   Placement assessment:    ETT to teeth:  22   Tube secured with:  ETT holder   Breath sounds:   Equal and absent over the epigastrium   Placement verification: chest rise, condensation, CXR verification, equal breath sounds and ETCO2 detector     CXR findings:  ETT in proper place Post-procedure details:    Patient tolerance of procedure:  Tolerated well, no immediate complications .Central Line Date/Time: 08/05/2016 9:28 AM Performed by: Willy EddyOBINSON, Beckett Hickmon Authorized by: Willy EddyOBINSON, Clydette Privitera   Consent:    Consent obtained:  Emergent situation   Consent given by:  Healthcare agent Pre-procedure details:    Hand hygiene: Hand hygiene performed prior to insertion     Sterile barrier technique: All elements of maximal sterile technique followed     Skin preparation:  2% chlorhexidine Sedation:    Sedation type:  Moderate (conscious) sedation and deep Anesthesia (see MAR for exact dosages):    Anesthesia method:  None Procedure details:    Location:  R internal jugular   Patient position:  Flat   Procedural supplies:  Triple lumen   Catheter size:  12 Fr   Landmarks identified: yes     Ultrasound guidance: yes     Sterile ultrasound techniques: Sterile gel and sterile probe covers were used     Number of attempts:  1   Successful placement: yes   Post-procedure details:    Post-procedure:  Dressing applied and line sutured   Assessment:  Blood return through all ports, no pneumothorax on x-ray and placement verified by x-ray   Patient tolerance of procedure:  Tolerated well, no immediate complications       Critical Care performed: yes CRITICAL CARE Performed by: Willy EddyPatrick Kajol Crispen   Total critical care time: 55 minutes  Critical care time was exclusive of separately billable  procedures and treating other patients.  Critical care was necessary to treat or prevent imminent or life-threatening deterioration.  Critical care was time spent personally by me on the following activities: development of treatment plan with patient and/or surrogate as well as nursing,  discussions with consultants, evaluation of patient's response to treatment, examination of patient, obtaining history from patient or surrogate, ordering and performing treatments and interventions, ordering and review of laboratory studies, ordering and review of radiographic studies, pulse oximetry and re-evaluation of patient's condition.  ____________________________________________   INITIAL IMPRESSION / ASSESSMENT AND PLAN / ED COURSE  Pertinent labs & imaging results that were available during my care of the patient were reviewed by me and considered in my medical decision making (see chart for details).  DDX: Dehydration, sepsis, pna, uti, hypoglycemia, cva, drug effect, withdrawal, encephalitis   Martha Lawson is a 35 y.o. who presents to the ED with Altered mental status and acute encephalopathy with unresponsiveness with a GCS of 4 requiring emergent intubation due to acute respiratory failure with hypoxia. Differential at this time includes overdose, heart failure, sepsis, hypovolemic shock.  IVF bolus ordered for hypotension  The patient will be placed on continuous pulse oximetry and telemetry for monitoring.  Laboratory evaluation will be sent to evaluate for the above complaints.     Clinical Course  Comment By Time  Patient developing fever and hypotension failing resuscitation with IV fluids. Patient was started on norepinephrine drip for refractory hypotension with improvement in blood pressures. Bedside ultrasound shows evidence of wall motion abnormalities. In the setting of elevated troponin and fever with hypotension do suspect that this is primarily metabolic. Does not meet STEMI criteria spoke with cardiology who agrees to follow but will not be taken emergently to the catheter lab. Willy Eddy, MD 09/15 [phone]  Husband was updated at bedside. Denies any further information regarding last night's events.  Patient's tachycardia is improved improving now to 1:15 but  still hypotensive now on nor epi of 16. Have ordered vasopressin but is still not available from the pharmacy.  I spoke with Dr. Belia Heman PICU regarding the patient and he kindly accepts patient for further evaluation management. Willy Eddy, MD 09/15 [phone]  Patient with elevated white count. No evidence of a KI with transaminitis. UDS testing positive for amphetamines, cocaine, opiates as well as benzodiazepines.  Lactate elevated to 3.6.  Patient received full volume resuscitation and now he mechanically stable on nor epi and vasopressin drips. Patient medically to ICU. CT imaging still pending. Patient's prognosis exceedingly poor.  Family updated. Willy Eddy, MD 09/15 [phone]     ____________________________________________   FINAL CLINICAL IMPRESSION(S) / ED DIAGNOSES  Final diagnoses:  Severe sepsis with septic shock (CODE) (HCC)  Systemic inflammatory response syndrome of infectious origin with acute organ failure (HCC)  Elevated troponin I level  Metabolic acidosis  Hypocalcemia      NEW MEDICATIONS STARTED DURING THIS VISIT:  New Prescriptions   No medications on file     Note:  This document was prepared using Dragon voice recognition software and may include unintentional dictation errors.    Willy Eddy, MD 08/05/16 [phone]

## 2016-08-05 NOTE — ED Notes (Addendum)
Verbal order by Dr. Roxan Hockeyobinson at bedside to increase Norepi to 8 mcg/min.  Verbal order given to titrate Norepi by 2 mcg/min instead of 1 mcg/min

## 2016-08-05 NOTE — Progress Notes (Signed)
Initial Nutrition Assessment  DOCUMENTATION CODES:   Not applicable  INTERVENTION:  -If unable to extubate within 24-48 hr recommend starting enteral nutrition via Adult Tube Feeding Protocol   NUTRITION DIAGNOSIS:   Inadequate oral intake related to acute illness as evidenced by NPO status.    GOAL:   Provide needs based on ASPEN/SCCM guidelines    MONITOR:   Vent status, Labs, Weight trends  REASON FOR ASSESSMENT:   Consult    ASSESSMENT:      Pt admitted after being found unresponsive at home by husband.  Pt positive for cocaine. Pt currently intubated. Neurology following  Past Medical History:  Diagnosis Date  . Asthma    OG tube in place with tip in body of stomach per xray  Medications reviewed: fentanyl, levophed, diprivan Labs reviewed: K 5.5, BUN 17, creatinine 1.82, AST 319, ALT 395, TG 101  Diet Order:     Skin:  Reviewed, no issues  Last BM:  Unknown  Height:   Ht Readings from Last 1 Encounters:  08/05/16 5\' 5"  (1.651 m)    Weight:   Wt Readings from Last 1 Encounters:  08/05/16 231 lb 7.7 oz (105 kg)    Ideal Body Weight:     BMI:  Body mass index is 38.52 kg/m.  Estimated Nutritional Needs:   Kcal:  1155-1470 kcals/d  Protein:  112-140 g/d (Using IBW of 56kg)  Fluid:  >/= 128200ml/d  EDUCATION NEEDS:   No education needs identified at this time  Paylin Hailu B. Freida BusmanAllen, RD, LDN [phone] (pager) Weekend/On-Call pager [phone])

## 2016-08-05 NOTE — ED Notes (Signed)
Patient family and chaplin currently at bedside

## 2016-08-05 NOTE — Progress Notes (Signed)
Husband brought to room and updated.  Husband did not want to give password at this time and requested to speak with MD.  Dr. Belia HemanKasa paged

## 2016-08-05 NOTE — Progress Notes (Signed)
Patient transported to MRI with RN x 2 and Cardiopulmonary present.

## 2016-08-05 NOTE — ED Notes (Signed)
Patient brought in unresponsive with pin point pupils. Unknown down time

## 2016-08-05 NOTE — H&P (Signed)
PULMONARY / CRITICAL CARE MEDICINE   Name: Martha GravelJessica Z Lawson MRN: [id number] DOB: 08/01/1981    ADMISSION DATE:  08/05/2016      HISTORY OF PRESENT ILLNESS:   35 yo white female admitted to ICU for acute mental status changes -patient found unresponsive at home by husband, patient was using drugs +cocaine  Patient was foaming at the mouth, gcs<4 Patient with resp failure Patient was intubated in ER placed on Vent CVl placed, placed on vasopressors  Patient remains comatosed  PAST MEDICAL HISTORY :   has a past medical history of Asthma.  has no past surgical history on file. Prior to Admission medications   Not on File   Allergies  Allergen Reactions  . Aspirin     FAMILY HISTORY:  has no family status information on file.   SOCIAL HISTORY:  reports that she has been smoking.  She uses smokeless tobacco. She reports that she drinks alcohol. She reports that she uses drugs.  REVIEW OF SYSTEMS:  Unable to provide due to critical illness  SUBJECTIVE:   VITAL SIGNS: Temp:  [98.9 F (37.2 C)-100.9 F (38.3 C)] 99.7 F (37.6 C) (09/15 1300) Pulse Rate:  [101-133] 103 (09/15 1300) Resp:  [0-31] 18 (09/15 1300) BP: (55-137)/(20-95) 112/74 (09/15 1300) SpO2:  [0 %-100 %] 99 % (09/15 1300) FiO2 (%):  [35 %-50 %] 35 % (09/15 1300) Weight:  [231 lb 7.7 oz (105 kg)] 231 lb 7.7 oz (105 kg) (09/15 1329) HEMODYNAMICS:   VENTILATOR SETTINGS: Vent Mode: AC FiO2 (%):  [35 %-50 %] 35 % Set Rate:  [18 bmp] 18 bmp Vt Set:  [450 mL] 450 mL PEEP:  [5 cmH20] 5 cmH20 INTAKE / OUTPUT:  Intake/Output Summary (Last 24 hours) at 08/05/16 1332 Last data filed at 08/05/16 1300  Gross per 24 hour  Intake          4093.75 ml  Output                0 ml  Net          4093.75 ml    PHYSICAL EXAMINATION: PHYSICAL EXAMINATION: Physical Examination:   GENERAL:critically ill appearing, +resp distress HEAD: Normocephalic, atraumatic.  EYES: Pupils equal, round, reactive to light.   No scleral icterus.  MOUTH: Moist mucosal membrane. NECK: Supple. No thyromegaly. No nodules. No JVD. c collar in place PULMONARY: Diffuse coarse rhonchi right sided +wheezes +rhonchi CARDIOVASCULAR: S1 and S2. Regular rate and rhythm. No murmurs, rubs, or gallops.  GASTROINTESTINAL: Soft, nontender, +distended. No masses. Positive bowel sounds. No hepatosplenomegaly.  MUSCULOSKELETAL: No swelling, clubbing, or edema.  NEUROLOGIC: GCS<8T SKIN:intact,warm,dry    LABS:  CBC  Recent Labs Lab 08/05/16 0736  WBC 12.9*  HGB 9.5*  HCT 29.8*  PLT 141*   Coag's No results for input(s): APTT, INR in the last 168 hours. BMET  Recent Labs Lab 08/05/16 0736  NA 139  K 5.5*  CL 106  CO2 21*  BUN 17  CREATININE 1.82*  GLUCOSE 78   Electrolytes  Recent Labs Lab 08/05/16 0736  CALCIUM 7.2*   Sepsis Markers  Recent Labs Lab 08/05/16 0736 08/05/16 1039  LATICACIDVEN 3.6* 5.4*   ABG  Recent Labs Lab 08/05/16 0756  PHART 7.30*  PCO2ART 34  PO2ART 117*   Liver Enzymes  Recent Labs Lab 08/05/16 0736  AST 319*  ALT 395*  ALKPHOS 36*  BILITOT 0.7  ALBUMIN 3.4*   Cardiac Enzymes  Recent Labs Lab 08/05/16 0736  TROPONINI  0.83*   Glucose  Recent Labs Lab 08/05/16 0725  GLUCAP 86    Imaging Dg Abdomen 1 View  Result Date: 08/05/2016 CLINICAL DATA:  Bedside orogastric tube placement. EXAM: ABDOMEN - 1 VIEW COMPARISON:  None. FINDINGS: OG tube tip in the body of the stomach. Moderate gaseous distention of the stomach. Bowel-gas pattern otherwise unremarkable without evidence of obstruction. IMPRESSION: 1. OG tube tip in the body of the stomach. 2. Moderate gaseous distention of the stomach. No acute abdominal abnormality otherwise. Electronically Signed   By: Hulan Saas M.D.   On: 08/05/2016 08:12   Ct Head Wo Contrast  Result Date: 08/05/2016 CLINICAL DATA:  Unresponsive.  Found down. EXAM: CT HEAD WITHOUT CONTRAST CT CERVICAL SPINE WITHOUT  CONTRAST TECHNIQUE: Multidetector CT imaging of the head and cervical spine was performed following the standard protocol without intravenous contrast. Multiplanar CT image reconstructions of the cervical spine were also generated. COMPARISON:  Head CT 05/22/2014 FINDINGS: CT HEAD FINDINGS Brain: The ventricles are normal in size and configuration. No extra-axial fluid collections are identified. The gray-white differentiation is normal. No CT findings for acute intracranial process such as hemorrhage or infarction. No mass lesions. The brainstem and cerebellum are grossly normal. Vascular: No hyperdense vessel or unexpected calcification. Skull: Normal. Negative for fracture or focal lesion. Sinuses/Orbits: Minimal mucoperiosteal thickening involving the right half of the sphenoid sinus. Otherwise the paranasal sinuses and mastoid air cells are clear. The globes are intact. Other: No scalp lesion or hematoma. CT CERVICAL SPINE FINDINGS Alignment: Grossly normal.  Exam limited by motion artifact. Skull base and vertebrae: Grossly normal. Soft tissues and spinal canal: No abnormal prevertebral soft tissue swelling. An endotracheal tube and NG tube place. Disc levels: No spinal canal compromise. No large disc protrusions. Upper chest: Scattered areas noted in the neck. No definite pneumothorax. Other: None IMPRESSION: No acute intracranial findings or skull fracture. Limited C-spine by motion artifact but no obvious fracture or malalignment. Electronically Signed   By: Rudie Meyer M.D.   On: 08/05/2016 10:06   Ct Cervical Spine Wo Contrast  Result Date: 08/05/2016 CLINICAL DATA:  Unresponsive.  Found down. EXAM: CT HEAD WITHOUT CONTRAST CT CERVICAL SPINE WITHOUT CONTRAST TECHNIQUE: Multidetector CT imaging of the head and cervical spine was performed following the standard protocol without intravenous contrast. Multiplanar CT image reconstructions of the cervical spine were also generated. COMPARISON:  Head CT  05/22/2014 FINDINGS: CT HEAD FINDINGS Brain: The ventricles are normal in size and configuration. No extra-axial fluid collections are identified. The gray-white differentiation is normal. No CT findings for acute intracranial process such as hemorrhage or infarction. No mass lesions. The brainstem and cerebellum are grossly normal. Vascular: No hyperdense vessel or unexpected calcification. Skull: Normal. Negative for fracture or focal lesion. Sinuses/Orbits: Minimal mucoperiosteal thickening involving the right half of the sphenoid sinus. Otherwise the paranasal sinuses and mastoid air cells are clear. The globes are intact. Other: No scalp lesion or hematoma. CT CERVICAL SPINE FINDINGS Alignment: Grossly normal.  Exam limited by motion artifact. Skull base and vertebrae: Grossly normal. Soft tissues and spinal canal: No abnormal prevertebral soft tissue swelling. An endotracheal tube and NG tube place. Disc levels: No spinal canal compromise. No large disc protrusions. Upper chest: Scattered areas noted in the neck. No definite pneumothorax. Other: None IMPRESSION: No acute intracranial findings or skull fracture. Limited C-spine by motion artifact but no obvious fracture or malalignment. Electronically Signed   By: Rudie Meyer M.D.   On: 08/05/2016 10:06  Ct Abdomen Pelvis W Contrast  Result Date: 08/05/2016 CLINICAL DATA:  Unresponsive. EXAM: CT ABDOMEN AND PELVIS WITH CONTRAST TECHNIQUE: Multidetector CT imaging of the abdomen and pelvis was performed using the standard protocol following bolus administration of intravenous contrast. CONTRAST:  80mL ISOVUE-300 IOPAMIDOL (ISOVUE-300) INJECTION 61% COMPARISON:  None. FINDINGS: Lower chest: No acute abnormality. Hepatobiliary: There is diffuse hepatic steatosis. There is relative hypertrophy of the lateral segment of left lobe and caudate lobe. No focal liver abnormality noted. Ascites is identified around the right lateral margin of the liver. There is  pericholecystic fluid noted. Pancreas: Unremarkable. No pancreatic ductal dilatation or surrounding inflammatory changes. Spleen: Normal in size without focal abnormality. Adrenals/Urinary Tract: The adrenal glands are normal. There are small bilateral renal cysts identified. No mass or hydronephrosis. The urinary bladder appears collapsed around a Foley catheter balloon. Stomach/Bowel: The stomach is normal. Nasogastric tube tip is in the body of the stomach. The small bowel loops have a normal course and caliber. The appendix is visualized and appears normal. Normal appearance of the colon. Vascular/Lymphatic: Normal appearance of the abdominal aorta. No enlarged retroperitoneal or mesenteric adenopathy. No enlarged pelvic or inguinal lymph nodes. Reproductive: Uterus and bilateral adnexa are unremarkable. Other: There is soft tissue edema within the upper abdomen in the region of the porta hepatis. Small amount of ascites identified. Musculoskeletal: There is no aggressive lytic or sclerotic bone lesions identified. IMPRESSION: 1. Hepatic steatosis is identified as well as perihepatic and pericholecystic ascites. 2. No evidence for abdominal or pelvic abscess or evidence of bowel obstruction. Electronically Signed   By: Signa Kell M.D.   On: 08/05/2016 10:21   Dg Chest Portable 1 View  Result Date: 08/05/2016 CLINICAL DATA:  Bedside central venous catheter placement. EXAM: PORTABLE CHEST 1 VIEW 8:36 a.m.: COMPARISON:  Portable chest x-ray earlier this morning at 7:48 a.m. FINDINGS: A right jugular central venous catheter tip projects over the lower SVC. No evidence of pneumothorax or mediastinal hematoma. Endotracheal tube and OG tube unchanged in position. External pacing pads. Lungs remain clear. Decompression of the stomach since the earlier examination. IMPRESSION: 1. Right jugular central venous catheter tip projects over the lower SVC. No acute complicating features. 2. Remaining support apparatus  satisfactory. 3.  No acute cardiopulmonary disease. Electronically Signed   By: Hulan Saas M.D.   On: 08/05/2016 08:48   Dg Chest Portable 1 View  Result Date: 08/05/2016 CLINICAL DATA:  Intubation. EXAM: PORTABLE CHEST 1 VIEW COMPARISON:  None. FINDINGS: Endotracheal tube tip in satisfactory position projecting approximately 3 cm above the carina. OG tube tip courses below the diaphragm into the stomach. Cardiomediastinal silhouette unremarkable for AP portable technique. Lungs clear. Bronchovascular markings normal. Pulmonary vascularity normal. No visible pleural effusions. No pneumothorax. External pacing pads noted. IMPRESSION: 1. Endotracheal tube tip in satisfactory position projecting approximately 3 cm above the carina. 2.  No acute cardiopulmonary disease. Electronically Signed   By: Hulan Saas M.D.   On: 08/05/2016 08:13     ASSESSMENT / PLAN:  35 yo white female admitted to ICU for acute encephalopathy from cocaine toxicity with neurogenic/cardiogenic shock with severe metabolic acidosis fings concerning for seizures and probable anoxic brain injury   PULMONARY -Respiratory Failure -continue Full MV support -continue Bronchodilator Therapy -Wean Fio2 and PEEP as tolerated    CARDIOVASCULAR Shock-neurogenic/cardiogenic Vasopressors to keep MAP>65   GASTROINTESTINAL Place OGT  HEMATOLOGIC follow CBC  INFECTIOUS No abx at this time  ENDOCRINE - ICU hypoglycemic\Hyperglycemia protocol  NEUROLOGIC - intubated -EEG mRI pending Follow up Neuro consult      FAMILY  - Updates: husband/mother updated  I have personally obtained a history, examined the patient, evaluated Pertinent laboratory and RadioGraphic/imaging results, and  formulated the assessment and plan   The Patient requires high complexity decision making for assessment and support, frequent evaluation and titration of therapies, application of advanced monitoring technologies and  extensive interpretation of multiple databases. Critical Care Time devoted to patient care services described in this note is 45 minutes.   Overall, patient is critically ill, prognosis is guarded.  Patient with Multiorgan failure and at high risk for cardiac arrest and death.    Lucie Leather, M.D.  [facility]  Medical Director [facility] Medical Director [facility] Cardio-Pulmonary Department

## 2016-08-05 NOTE — Consult Note (Signed)
[facility] Cardiology  CARDIOLOGY CONSULT NOTE  Patient ID: Martha Lawson MRN: [id number] DOB/AGE: January 05, 1981 35 y.o.  Admit date: 08/05/2016 Referring Physician Kasa Primary Physician  Primary Cardiologist  Reason for Consultation Shock  HPI: 35 year old female referred for evaluation of shock. Patient was found unresponsive at home by husband, was using cocaine. The emergency room patient was in obvious respiratory failure requiring intubation. Patient was hypotensive, on vasopressors. ECG revealed sinus tachycardia without ischemic ST-T wave changes. Admission labs were notable for borderline elevated troponin of 0.83. Head CT was unremarkable. Abdominal CT was unremarkable. Urine tox screen was positive for cocaine, benzodiazepines, opiates and amphetamines. Patient with likely significant brain anoxia.  Review of systems complete and found to be negative unless listed above     Past Medical History:  Diagnosis Date  . Asthma     History reviewed. No pertinent surgical history.  No prescriptions prior to admission.   Social History   Social History  . Marital status: Single    Spouse name: N/A  . Number of children: N/A  . Years of education: N/A   Occupational History  . Not on file.   Social History Main Topics  . Smoking status: Current Every Day Smoker  . Smokeless tobacco: Current User  . Alcohol use Yes  . Drug use:   . Sexual activity: Not on file   Other Topics Concern  . Not on file   Social History Narrative  . No narrative on file    History reviewed. No pertinent family history.    Review of systems complete and found to be negative unless listed above      PHYSICAL EXAM  General: Well developed, well nourished, in no acute distress HEENT:  Normocephalic and atramatic Neck:  No JVD.  Lungs: Clear bilaterally to auscultation and percussion. Heart: HRRR . Normal S1 and S2 without gallops or murmurs.  Abdomen: Bowel sounds are positive, abdomen  soft and non-tender  Msk:  Back normal, normal gait. Normal strength and tone for age. Extremities: No clubbing, cyanosis or edema.   Neuro: Alert and oriented X 3. Psych:  Good affect, responds appropriately  Labs:   Lab Results  Component Value Date   WBC 12.9 (H) 08/05/2016   HGB 9.5 (L) 08/05/2016   HCT 29.8 (L) 08/05/2016   MCV 95.9 08/05/2016   PLT 141 (L) 08/05/2016    Recent Labs Lab 08/05/16 0736  NA 139  K 5.5*  CL 106  CO2 21*  BUN 17  CREATININE 1.82*  CALCIUM 7.2*  PROT 6.6  BILITOT 0.7  ALKPHOS 36*  ALT 395*  AST 319*  GLUCOSE 78   Lab Results  Component Value Date   TROPONINI 0.83 (HH) 08/05/2016   No results found for: CHOL No results found for: HDL No results found for: [facility] Lab Results  Component Value Date   TRIG 101 08/05/2016   No results found for: CHOLHDL No results found for: LDLDIRECT    Radiology: Dg Abdomen 1 View  Result Date: 08/05/2016 CLINICAL DATA:  Bedside orogastric tube placement. EXAM: ABDOMEN - 1 VIEW COMPARISON:  None. FINDINGS: OG tube tip in the body of the stomach. Moderate gaseous distention of the stomach. Bowel-gas pattern otherwise unremarkable without evidence of obstruction. IMPRESSION: 1. OG tube tip in the body of the stomach. 2. Moderate gaseous distention of the stomach. No acute abdominal abnormality otherwise. Electronically Signed   By: Hulan Saas M.D.   On: 08/05/2016 08:12   Ct Head  Wo Contrast  Result Date: 08/05/2016 CLINICAL DATA:  Unresponsive.  Found down. EXAM: CT HEAD WITHOUT CONTRAST CT CERVICAL SPINE WITHOUT CONTRAST TECHNIQUE: Multidetector CT imaging of the head and cervical spine was performed following the standard protocol without intravenous contrast. Multiplanar CT image reconstructions of the cervical spine were also generated. COMPARISON:  Head CT 05/22/2014 FINDINGS: CT HEAD FINDINGS Brain: The ventricles are normal in size and configuration. No extra-axial fluid collections are  identified. The gray-white differentiation is normal. No CT findings for acute intracranial process such as hemorrhage or infarction. No mass lesions. The brainstem and cerebellum are grossly normal. Vascular: No hyperdense vessel or unexpected calcification. Skull: Normal. Negative for fracture or focal lesion. Sinuses/Orbits: Minimal mucoperiosteal thickening involving the right half of the sphenoid sinus. Otherwise the paranasal sinuses and mastoid air cells are clear. The globes are intact. Other: No scalp lesion or hematoma. CT CERVICAL SPINE FINDINGS Alignment: Grossly normal.  Exam limited by motion artifact. Skull base and vertebrae: Grossly normal. Soft tissues and spinal canal: No abnormal prevertebral soft tissue swelling. An endotracheal tube and NG tube place. Disc levels: No spinal canal compromise. No large disc protrusions. Upper chest: Scattered areas noted in the neck. No definite pneumothorax. Other: None IMPRESSION: No acute intracranial findings or skull fracture. Limited C-spine by motion artifact but no obvious fracture or malalignment. Electronically Signed   By: Rudie Meyer M.D.   On: 08/05/2016 10:06   Ct Cervical Spine Wo Contrast  Result Date: 08/05/2016 CLINICAL DATA:  Unresponsive.  Found down. EXAM: CT HEAD WITHOUT CONTRAST CT CERVICAL SPINE WITHOUT CONTRAST TECHNIQUE: Multidetector CT imaging of the head and cervical spine was performed following the standard protocol without intravenous contrast. Multiplanar CT image reconstructions of the cervical spine were also generated. COMPARISON:  Head CT 05/22/2014 FINDINGS: CT HEAD FINDINGS Brain: The ventricles are normal in size and configuration. No extra-axial fluid collections are identified. The gray-white differentiation is normal. No CT findings for acute intracranial process such as hemorrhage or infarction. No mass lesions. The brainstem and cerebellum are grossly normal. Vascular: No hyperdense vessel or unexpected  calcification. Skull: Normal. Negative for fracture or focal lesion. Sinuses/Orbits: Minimal mucoperiosteal thickening involving the right half of the sphenoid sinus. Otherwise the paranasal sinuses and mastoid air cells are clear. The globes are intact. Other: No scalp lesion or hematoma. CT CERVICAL SPINE FINDINGS Alignment: Grossly normal.  Exam limited by motion artifact. Skull base and vertebrae: Grossly normal. Soft tissues and spinal canal: No abnormal prevertebral soft tissue swelling. An endotracheal tube and NG tube place. Disc levels: No spinal canal compromise. No large disc protrusions. Upper chest: Scattered areas noted in the neck. No definite pneumothorax. Other: None IMPRESSION: No acute intracranial findings or skull fracture. Limited C-spine by motion artifact but no obvious fracture or malalignment. Electronically Signed   By: Rudie Meyer M.D.   On: 08/05/2016 10:06   Mr Brain Wo Contrast  Result Date: 08/05/2016 CLINICAL DATA:  Patient with substance abuse found unresponsive earlier today. Respiratory failure. EXAM: MRI HEAD WITHOUT CONTRAST TECHNIQUE: Multiplanar, multiecho pulse sequences of the brain and surrounding structures were obtained without intravenous contrast. COMPARISON:  : CT head earlier today. FINDINGS: Brain: Marked restricted diffusion both globus pallidi consistent with anoxic injury. Other considerations could include cardiac arrest, severe blood loss, or carbon monoxide poisoning. Similar smaller focus restricted diffusion LEFT forceps minor corpus callosum. No hemorrhage, mass lesion, hydrocephalus, or extra-axial fluid. Vascular: Normal flow voids. Skull and upper cervical spine: Normal marrow  signal. Sinuses/Orbits: Negative. Other: None. 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 See discussion above. Smaller focus of acute infarction affects the LEFT corpus callosum.  Electronically Signed   By: Elsie StainJohn T Curnes M.D.   On: 08/05/2016 16:42   Ct Abdomen Pelvis W Contrast  Result Date: 08/05/2016 CLINICAL DATA:  Unresponsive. EXAM: CT ABDOMEN AND PELVIS WITH CONTRAST TECHNIQUE: Multidetector CT imaging of the abdomen and pelvis was performed using the standard protocol following bolus administration of intravenous contrast. CONTRAST:  80mL ISOVUE-300 IOPAMIDOL (ISOVUE-300) INJECTION 61% COMPARISON:  None. FINDINGS: Lower chest: No acute abnormality. Hepatobiliary: There is diffuse hepatic steatosis. There is relative hypertrophy of the lateral segment of left lobe and caudate lobe. No focal liver abnormality noted. Ascites is identified around the right lateral margin of the liver. There is pericholecystic fluid noted. Pancreas: Unremarkable. No pancreatic ductal dilatation or surrounding inflammatory changes. Spleen: Normal in size without focal abnormality. Adrenals/Urinary Tract: The adrenal glands are normal. There are small bilateral renal cysts identified. No mass or hydronephrosis. The urinary bladder appears collapsed around a Foley catheter balloon. Stomach/Bowel: The stomach is normal. Nasogastric tube tip is in the body of the stomach. The small bowel loops have a normal course and caliber. The appendix is visualized and appears normal. Normal appearance of the colon. Vascular/Lymphatic: Normal appearance of the abdominal aorta. No enlarged retroperitoneal or mesenteric adenopathy. No enlarged pelvic or inguinal lymph nodes. Reproductive: Uterus and bilateral adnexa are unremarkable. Other: There is soft tissue edema within the upper abdomen in the region of the porta hepatis. Small amount of ascites identified. Musculoskeletal: There is no aggressive lytic or sclerotic bone lesions identified. IMPRESSION: 1. Hepatic steatosis is identified as well as perihepatic and pericholecystic ascites. 2. No evidence for abdominal or pelvic abscess or evidence of bowel  obstruction. Electronically Signed   By: Signa Kellaylor  Stroud M.D.   On: 08/05/2016 10:21   Dg Chest Portable 1 View  Result Date: 08/05/2016 CLINICAL DATA:  Bedside central venous catheter placement. EXAM: PORTABLE CHEST 1 VIEW 8:36 a.m.: COMPARISON:  Portable chest x-ray earlier this morning at 7:48 a.m. FINDINGS: A right jugular central venous catheter tip projects over the lower SVC. No evidence of pneumothorax or mediastinal hematoma. Endotracheal tube and OG tube unchanged in position. External pacing pads. Lungs remain clear. Decompression of the stomach since the earlier examination. IMPRESSION: 1. Right jugular central venous catheter tip projects over the lower SVC. No acute complicating features. 2. Remaining support apparatus satisfactory. 3.  No acute cardiopulmonary disease. Electronically Signed   By: Hulan Saashomas  Lawrence M.D.   On: 08/05/2016 08:48   Dg Chest Portable 1 View  Result Date: 08/05/2016 CLINICAL DATA:  Intubation. EXAM: PORTABLE CHEST 1 VIEW COMPARISON:  None. FINDINGS: Endotracheal tube tip in satisfactory position projecting approximately 3 cm above the carina. OG tube tip courses below the diaphragm into the stomach. Cardiomediastinal silhouette unremarkable for AP portable technique. Lungs clear. Bronchovascular markings normal. Pulmonary vascularity normal. No visible pleural effusions. No pneumothorax. External pacing pads noted. IMPRESSION: 1. Endotracheal tube tip in satisfactory position projecting approximately 3 cm above the carina. 2.  No acute cardiopulmonary disease. Electronically Signed   By: Hulan Saashomas  Lawrence M.D.   On: 08/05/2016 08:13    EKG: Sinus tachycardia  ASSESSMENT AND PLAN:   1. Cocaine and drug induced respiratory failure with probable severe encephalopathy metabolic acidosis and hypotension requiring ventilator and vasopressors. Initial troponin borderline elevated, likely due to demand  supply ischemia. ECG reveals sinus tachycardia without ischemic ST-T  wave changes.  Recommendations  1. Agree with current therapy 2. Defer full dose anticoagulation 3. No indication for invasive cardiac evaluation 4. Review 2-D echocardiogram   Signed: Deejay Koppelman MD,PhD, [facility] 08/05/2016, 4:46 PM

## 2016-08-05 NOTE — ED Notes (Signed)
Fentanyl stopped due to patients blood pressure dropping.

## 2016-08-05 NOTE — ED Notes (Signed)
Dr. Roxan Hockeyobinson at bedside preparing to intubate patient

## 2016-08-05 NOTE — ED Notes (Signed)
Dr. Roxan Hockeyobinson at bedside placing a central line

## 2016-08-05 NOTE — Progress Notes (Signed)
Patient returned from MRI appropriate monitors reapplied.

## 2016-08-06 ENCOUNTER — Inpatient Hospital Stay: Payer: Medicaid Other

## 2016-08-06 DIAGNOSIS — R404 Transient alteration of awareness: Secondary | ICD-10-CM

## 2016-08-06 LAB — CBC
HCT: 33.3 % — ABNORMAL LOW (ref 35.0–47.0)
Hemoglobin: 11.7 g/dL — ABNORMAL LOW (ref 12.0–16.0)
MCH: 31.9 pg (ref 26.0–34.0)
MCHC: 35 g/dL (ref 32.0–36.0)
MCV: 91.2 fL (ref 80.0–100.0)
PLATELETS: 94 10*3/uL — AB (ref 150–440)
RBC: 3.65 MIL/uL — AB (ref 3.80–5.20)
RDW: 13.1 % (ref 11.5–14.5)
WBC: 9.9 10*3/uL (ref 3.6–11.0)

## 2016-08-06 LAB — BLOOD GAS, ARTERIAL
Acid-base deficit: 0.8 mmol/L (ref 0.0–2.0)
Acid-base deficit: 0.2 mmol/L (ref 0.0–2.0)
Bicarbonate: 24.3 mmol/L (ref 20.0–28.0)
Bicarbonate: 24.8 mmol/L (ref 20.0–28.0)
FIO2: 0.35
FIO2: 0.28
MECHANICAL RATE: 18
O2 Saturation: 98.9 %
O2 Saturation: 98 %
PATIENT TEMPERATURE: 37
PCO2 ART: 41 mmHg (ref 32.0–48.0)
PEEP/CPAP: 5 cmH2O
PEEP/CPAP: 5 cmH2O
PH ART: 7.39 (ref 7.350–7.450)
Patient temperature: 37
Pressure support: 8 cmH2O
VT: 450 mL
pCO2 arterial: 41 mmHg (ref 32.0–48.0)
pH, Arterial: 7.38 (ref 7.350–7.450)
pO2, Arterial: 130 mmHg — ABNORMAL HIGH (ref 83.0–108.0)
pO2, Arterial: 105 mmHg (ref 83.0–108.0)

## 2016-08-06 LAB — COMPREHENSIVE METABOLIC PANEL
ALBUMIN: 2.9 g/dL — AB (ref 3.5–5.0)
ALK PHOS: 32 U/L — AB (ref 38–126)
ALT: 5266 U/L — AB (ref 14–54)
AST: 6862 U/L — AB (ref 15–41)
Anion gap: 6 (ref 5–15)
BILIRUBIN TOTAL: 1.3 mg/dL — AB (ref 0.3–1.2)
BUN: 22 mg/dL — AB (ref 6–20)
CALCIUM: 6.9 mg/dL — AB (ref 8.9–10.3)
CO2: 25 mmol/L (ref 22–32)
Chloride: 109 mmol/L (ref 101–111)
Creatinine, Ser: 2.41 mg/dL — ABNORMAL HIGH (ref 0.44–1.00)
GFR calc Af Amer: 29 mL/min — ABNORMAL LOW (ref >60)
GFR calc non Af Amer: 25 mL/min — ABNORMAL LOW (ref >60)
GLUCOSE: 105 mg/dL — AB (ref 65–99)
Potassium: 3.8 mmol/L (ref 3.5–5.1)
Sodium: 140 mmol/L (ref 135–145)
TOTAL PROTEIN: 5.4 g/dL — AB (ref 6.5–8.1)

## 2016-08-06 LAB — GLUCOSE, CAPILLARY
Glucose-Capillary: 107 mg/dL — ABNORMAL HIGH (ref 65–99)
Glucose-Capillary: 122 mg/dL — ABNORMAL HIGH (ref 65–99)
Glucose-Capillary: 132 mg/dL — ABNORMAL HIGH (ref 65–99)

## 2016-08-06 LAB — LACTIC ACID, PLASMA: Lactic Acid, Venous: 1.2 mmol/L (ref 0.5–1.9)

## 2016-08-06 LAB — PHOSPHORUS: Phosphorus: 2.8 mg/dL (ref 2.5–4.6)

## 2016-08-06 LAB — MAGNESIUM: Magnesium: 2 mg/dL (ref 1.7–2.4)

## 2016-08-06 MED ORDER — DEXMEDETOMIDINE HCL IN NACL 400 MCG/100ML IV SOLN
0.4000 ug/kg/h | INTRAVENOUS | Status: DC
  Administered 2016-08-06: 0.3 ug/kg/h via INTRAVENOUS
  Administered 2016-08-07: 0.7 ug/kg/h via INTRAVENOUS
  Administered 2016-08-07: 0.25 ug/kg/h via INTRAVENOUS
  Administered 2016-08-08: 0.4 ug/kg/h via INTRAVENOUS
  Administered 2016-08-08: 0.8 ug/kg/h via INTRAVENOUS
  Filled 2016-08-06 (×5): qty 100

## 2016-08-06 MED ORDER — POLYETHYLENE GLYCOL 3350 17 G PO PACK
17.0000 g | PACK | Freq: Every day | ORAL | Status: DC
  Administered 2016-08-06 – 2016-08-10 (×4): 17 g via ORAL
  Filled 2016-08-06 (×6): qty 1

## 2016-08-06 MED ORDER — DEXTROSE-NACL 5-0.45 % IV SOLN
INTRAVENOUS | Status: DC
  Administered 2016-08-06 – 2016-08-09 (×7): via INTRAVENOUS

## 2016-08-06 MED ORDER — VITAL HIGH PROTEIN PO LIQD
1000.0000 mL | ORAL | Status: DC
  Administered 2016-08-06: 20:00:00
  Administered 2016-08-06: 1000 mL
  Administered 2016-08-06: 19:00:00
  Administered 2016-08-07 – 2016-08-08 (×2): 1000 mL
  Administered 2016-08-08: 11:00:00

## 2016-08-06 MED ORDER — ORAL CARE MOUTH RINSE
15.0000 mL | OROMUCOSAL | Status: DC
  Administered 2016-08-06 – 2016-08-08 (×26): 15 mL via OROMUCOSAL

## 2016-08-06 NOTE — Progress Notes (Signed)
Nutrition Follow-up  DOCUMENTATION CODES:   Not applicable  INTERVENTION:  -Verbal order to start enteral nutrition from MD Kasa.  Recommend vital high protein at 4555ml/hr goal rate, PEP up protocol.  Will provide 1320 kcals, 116 g of protein and 1108ml free water. Recommend tube maintenance flush at this time 30mlq 4 hr.     NUTRITION DIAGNOSIS:   Inadequate oral intake related to acute illness as evidenced by NPO status.  ongoing  GOAL:   Provide needs based on ASPEN/SCCM guidelines  Starting tube feeding  MONITOR:   Vent status, Labs, Weight trends  REASON FOR ASSESSMENT:   Consult    ASSESSMENT:      Pt continues on vent. Neurology following. Pt spiked fever this am and currently in the process of moving pt to room ICU 16  OG in body of stomach per xray  Medications reviewed: miralax, D5 1/2 NS at 13100ml/hr, vasopressin Labs reviewed: BUN 22, creatinine 2.41, glucose 105, ALT and AST elevated   Unable to complete Nutrition-Focused physical exam at this time.    Diet Order:     Skin:  Reviewed, no issues  Last BM:  Unknown  Height:   Ht Readings from Last 1 Encounters:  08/05/16 5\' 5"  (1.651 m)    Weight:   Wt Readings from Last 1 Encounters:  08/06/16 227 lb 4.7 oz (103.1 kg)    Ideal Body Weight:     BMI:  Body mass index is 37.82 kg/m.  Estimated Nutritional Needs:   Kcal:  1155-1470 kcals/d  Protein:  112-140 g/d (Using IBW of 56kg)  Fluid:  >/= 12100ml/d  EDUCATION NEEDS:   No education needs identified at this time  Bunnie Lederman B. Freida BusmanAllen, RD, LDN [phone] (pager) Weekend/On-Call pager [phone])

## 2016-08-06 NOTE — Progress Notes (Signed)
eLink Physician-Brief Progress Note Patient Name: Marina GravelJessica Z Britz DOB: [birth year] MRN: [id number]   Date of Service  08/06/2016  HPI/Events of Note  Agitation per RN  Although appears calm on camera  eICU Interventions  Dc all sedation orders Use precedex gtt, RASS goal 0     Intervention Category Major Interventions: Delirium, psychosis, severe agitation - evaluation and management  Braxton Vantrease V. 08/06/2016, 4:45 PM

## 2016-08-06 NOTE — Consult Note (Signed)
Reason for Consult: not waking up Referring Physician: Dr. Belia Heman  CC: pt found down   HPI: Martha Lawson is an 35 y.o. female with multiple substance abuse found unresponsive at home by her husband. Utox positive for cocaine, benzodiazepines, opiates and amphetemines. Found to be in respiratory failure and s/p intubation.   CTH no acute abnormality  MRI brain b/l globus pallidus injury.     Past Medical History:  Diagnosis Date  . Asthma     History reviewed. No pertinent surgical history.  History reviewed. No pertinent family history.  Social History:  reports that she has been smoking.  She uses smokeless tobacco. She reports that she drinks alcohol. She reports that she uses drugs.  Allergies  Allergen Reactions  . Aspirin     Medications: I have reviewed the patient's current medications.  ROS: Not able to obtain   Physical Examination: Blood pressure 108/70, pulse 93, temperature 99.2 F (37.3 C), temperature source Oral, resp. rate 18, height 5\' 5"  (1.651 m), weight 103.1 kg (227 lb 4.7 oz), SpO2 100 %.  Pt is still on ventilator  Does not follow commands Localizing b/l upper and lower etremities   Laboratory Studies:   Basic Metabolic Panel:  Recent Labs Lab 08/05/16 0736 08/06/16 0430  NA 139 140  K 5.5* 3.8  CL 106 109  CO2 21* 25  GLUCOSE 78 105*  BUN 17 22*  CREATININE 1.82* 2.41*  CALCIUM 7.2* 6.9*    Liver Function Tests:  Recent Labs Lab 08/05/16 0736 08/06/16 0430  AST 319* 6,862*  ALT 395* 5,266*  ALKPHOS 36* 32*  BILITOT 0.7 1.3*  PROT 6.6 5.4*  ALBUMIN 3.4* 2.9*   No results for input(s): LIPASE, AMYLASE in the last 168 hours. No results for input(s): AMMONIA in the last 168 hours.  CBC:  Recent Labs Lab 08/05/16 0736 08/06/16 0430  WBC 12.9* 9.9  NEUTROABS 11.4*  --   HGB 9.5* 11.7*  HCT 29.8* 33.3*  MCV 95.9 91.2  PLT 141* 94*    Cardiac Enzymes:  Recent Labs Lab 08/05/16 0736  TROPONINI 0.83*     BNP: Invalid input(s): POCBNP  CBG:  Recent Labs Lab 08/05/16 0725 08/05/16 1211  GLUCAP 86 97    Microbiology: Results for orders placed or performed during the hospital encounter of 08/05/16  Blood culture (routine x 2)     Status: None (Preliminary result)   Collection Time: 08/05/16  9:12 AM  Result Value Ref Range Status   Specimen Description BLOOD CENTRAL LINE  Final   Special Requests BOTTLES DRAWN AEROBIC AND ANAEROBIC  4CC  Final   Culture NO GROWTH < 24 HOURS  Final   Report Status PENDING  Incomplete  Blood culture (routine x 2)     Status: None (Preliminary result)   Collection Time: 08/05/16  9:12 AM  Result Value Ref Range Status   Specimen Description BLOOD RIGHT ARM  Final   Special Requests BOTTLES DRAWN AEROBIC AND ANAEROBIC  5CC  Final   Culture NO GROWTH < 24 HOURS  Final   Report Status PENDING  Incomplete  MRSA PCR Screening     Status: Abnormal   Collection Time: 08/05/16 12:25 PM  Result Value Ref Range Status   MRSA by PCR POSITIVE (A) NEGATIVE Final    Comment:        The GeneXpert MRSA Assay (FDA approved for NASAL specimens only), is one component of a comprehensive MRSA colonization surveillance program. It is not  intended to diagnose MRSA infection nor to guide or monitor treatment for MRSA infections. RESULT CALLED TO, READ BACK BY AND VERIFIED WITH: RACHEL VERDI 08/05/16 1348 SGD   Culture, expectorated sputum-assessment     Status: None   Collection Time: 08/05/16  1:32 PM  Result Value Ref Range Status   Specimen Description EXPECTORATED SPUTUM  Final   Special Requests NONE  Final   Sputum evaluation THIS SPECIMEN IS ACCEPTABLE FOR SPUTUM CULTURE  Final   Report Status 08/05/2016 FINAL  Final  Culture, respiratory (NON-Expectorated)     Status: None (Preliminary result)   Collection Time: 08/05/16  1:32 PM  Result Value Ref Range Status   Specimen Description EXPECTORATED SPUTUM  Final   Special Requests NONE Reflexed  from [id number]  Final   Gram Stain   Final    ABUNDANT WBC PRESENT, PREDOMINANTLY PMN ABUNDANT GRAM POSITIVE COCCI IN CLUSTERS MODERATE GRAM POSITIVE COCCI IN PAIRS AND CHAINS FEW GRAM POSITIVE RODS FEW GRAM NEGATIVE COCCOBACILLI    Culture   Final    CULTURE REINCUBATED FOR BETTER GROWTH Performed at [facility]    Report Status PENDING  Incomplete    Coagulation Studies: No results for input(s): LABPROT, INR in the last 72 hours.  Urinalysis:   Recent Labs Lab 08/05/16 0736  COLORURINE YELLOW*  LABSPEC 1.016  PHURINE 6.0  GLUCOSEU 50*  HGBUR 3+*  BILIRUBINUR NEGATIVE  KETONESUR NEGATIVE  PROTEINUR >500*  NITRITE NEGATIVE  LEUKOCYTESUR NEGATIVE    Lipid Panel:     Component Value Date/Time   TRIG 101 08/05/2016 0736    HgbA1C: No results found for: HGBA1C  Urine Drug Screen:      Component Value Date/Time   LABOPIA POSITIVE (A) 08/05/2016 0736   COCAINSCRNUR POSITIVE (A) 08/05/2016 0736   LABBENZ POSITIVE (A) 08/05/2016 0736   AMPHETMU POSITIVE (A) 08/05/2016 0736   THCU NONE DETECTED 08/05/2016 0736   LABBARB NONE DETECTED 08/05/2016 0736    Alcohol Level:   Recent Labs Lab 08/05/16 0736  ETH <5    Other results: EKG: normal EKG, normal sinus rhythm, unchanged from previous tracings.  Imaging: Dg Abdomen 1 View  Result Date: 08/05/2016 CLINICAL DATA:  Bedside orogastric tube placement. EXAM: ABDOMEN - 1 VIEW COMPARISON:  None. FINDINGS: OG tube tip in the body of the stomach. Moderate gaseous distention of the stomach. Bowel-gas pattern otherwise unremarkable without evidence of obstruction. IMPRESSION: 1. OG tube tip in the body of the stomach. 2. Moderate gaseous distention of the stomach. No acute abdominal abnormality otherwise. Electronically Signed   By: Hulan Saas M.D.   On: 08/05/2016 08:12   Ct Head Wo Contrast  Result Date: 08/05/2016 CLINICAL DATA:  Unresponsive.  Found down. EXAM: CT HEAD WITHOUT CONTRAST CT CERVICAL  SPINE WITHOUT CONTRAST TECHNIQUE: Multidetector CT imaging of the head and cervical spine was performed following the standard protocol without intravenous contrast. Multiplanar CT image reconstructions of the cervical spine were also generated. COMPARISON:  Head CT 05/22/2014 FINDINGS: CT HEAD FINDINGS Brain: The ventricles are normal in size and configuration. No extra-axial fluid collections are identified. The gray-white differentiation is normal. No CT findings for acute intracranial process such as hemorrhage or infarction. No mass lesions. The brainstem and cerebellum are grossly normal. Vascular: No hyperdense vessel or unexpected calcification. Skull: Normal. Negative for fracture or focal lesion. Sinuses/Orbits: Minimal mucoperiosteal thickening involving the right half of the sphenoid sinus. Otherwise the paranasal sinuses and mastoid air cells are clear. The globes are  intact. Other: No scalp lesion or hematoma. CT CERVICAL SPINE FINDINGS Alignment: Grossly normal.  Exam limited by motion artifact. Skull base and vertebrae: Grossly normal. Soft tissues and spinal canal: No abnormal prevertebral soft tissue swelling. An endotracheal tube and NG tube place. Disc levels: No spinal canal compromise. No large disc protrusions. Upper chest: Scattered areas noted in the neck. No definite pneumothorax. Other: None IMPRESSION: No acute intracranial findings or skull fracture. Limited C-spine by motion artifact but no obvious fracture or malalignment. Electronically Signed   By: Rudie Meyer M.D.   On: 08/05/2016 10:06   Ct Cervical Spine Wo Contrast  Result Date: 08/05/2016 CLINICAL DATA:  Unresponsive.  Found down. EXAM: CT HEAD WITHOUT CONTRAST CT CERVICAL SPINE WITHOUT CONTRAST TECHNIQUE: Multidetector CT imaging of the head and cervical spine was performed following the standard protocol without intravenous contrast. Multiplanar CT image reconstructions of the cervical spine were also generated.  COMPARISON:  Head CT 05/22/2014 FINDINGS: CT HEAD FINDINGS Brain: The ventricles are normal in size and configuration. No extra-axial fluid collections are identified. The gray-white differentiation is normal. No CT findings for acute intracranial process such as hemorrhage or infarction. No mass lesions. The brainstem and cerebellum are grossly normal. Vascular: No hyperdense vessel or unexpected calcification. Skull: Normal. Negative for fracture or focal lesion. Sinuses/Orbits: Minimal mucoperiosteal thickening involving the right half of the sphenoid sinus. Otherwise the paranasal sinuses and mastoid air cells are clear. The globes are intact. Other: No scalp lesion or hematoma. CT CERVICAL SPINE FINDINGS Alignment: Grossly normal.  Exam limited by motion artifact. Skull base and vertebrae: Grossly normal. Soft tissues and spinal canal: No abnormal prevertebral soft tissue swelling. An endotracheal tube and NG tube place. Disc levels: No spinal canal compromise. No large disc protrusions. Upper chest: Scattered areas noted in the neck. No definite pneumothorax. Other: None IMPRESSION: No acute intracranial findings or skull fracture. Limited C-spine by motion artifact but no obvious fracture or malalignment. Electronically Signed   By: Rudie Meyer M.D.   On: 08/05/2016 10:06   Mr Brain Wo Contrast  Result Date: 08/05/2016 CLINICAL DATA:  Patient with substance abuse found unresponsive earlier today. Respiratory failure. EXAM: MRI HEAD WITHOUT CONTRAST TECHNIQUE: Multiplanar, multiecho pulse sequences of the brain and surrounding structures were obtained without intravenous contrast. COMPARISON:  : CT head earlier today. FINDINGS: Brain: Marked restricted diffusion both globus pallidi consistent with anoxic injury. Other considerations could include cardiac arrest, severe blood loss, or carbon monoxide poisoning. Similar smaller focus restricted diffusion LEFT forceps minor corpus callosum. No hemorrhage,  mass lesion, hydrocephalus, or extra-axial fluid. Vascular: Normal flow voids. Skull and upper cervical spine: Normal marrow signal. Sinuses/Orbits: Negative. Other: None. 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 See discussion above. Smaller focus of acute infarction affects the LEFT corpus callosum. Electronically Signed   By: Elsie Stain M.D.   On: 08/05/2016 16:42   Ct Abdomen Pelvis W Contrast  Result Date: 08/05/2016 CLINICAL DATA:  Unresponsive. EXAM: CT ABDOMEN AND PELVIS WITH CONTRAST TECHNIQUE: Multidetector CT imaging of the abdomen and pelvis was performed using the standard protocol following bolus administration of intravenous contrast. CONTRAST:  80mL ISOVUE-300 IOPAMIDOL (ISOVUE-300) INJECTION 61% COMPARISON:  None. FINDINGS: Lower chest: No acute abnormality. Hepatobiliary: There is diffuse hepatic steatosis. There is relative hypertrophy of the lateral segment of left lobe and caudate lobe. No focal liver abnormality noted. Ascites is identified around the right lateral margin of the liver.  There is pericholecystic fluid noted. Pancreas: Unremarkable. No pancreatic ductal dilatation or surrounding inflammatory changes. Spleen: Normal in size without focal abnormality. Adrenals/Urinary Tract: The adrenal glands are normal. There are small bilateral renal cysts identified. No mass or hydronephrosis. The urinary bladder appears collapsed around a Foley catheter balloon. Stomach/Bowel: The stomach is normal. Nasogastric tube tip is in the body of the stomach. The small bowel loops have a normal course and caliber. The appendix is visualized and appears normal. Normal appearance of the colon. Vascular/Lymphatic: Normal appearance of the abdominal aorta. No enlarged retroperitoneal or mesenteric adenopathy. No enlarged pelvic or inguinal lymph nodes. Reproductive: Uterus and bilateral adnexa are unremarkable.  Other: There is soft tissue edema within the upper abdomen in the region of the porta hepatis. Small amount of ascites identified. Musculoskeletal: There is no aggressive lytic or sclerotic bone lesions identified. IMPRESSION: 1. Hepatic steatosis is identified as well as perihepatic and pericholecystic ascites. 2. No evidence for abdominal or pelvic abscess or evidence of bowel obstruction. Electronically Signed   By: Signa Kell M.D.   On: 08/05/2016 10:21   Dg Chest Port 1 View  Result Date: 08/06/2016 CLINICAL DATA:  Intubated patient EXAM: PORTABLE CHEST 1 VIEW COMPARISON:  08/05/2016 FINDINGS: Endotracheal tube, NG tube and central venous line unchanged. Mild central venous congestion. No pneumothorax or infiltrate. IMPRESSION: Stable support apparatus. Central venous congestion, mild. Electronically Signed   By: Genevive Bi M.D.   On: 08/06/2016 07:09   Dg Chest Portable 1 View  Result Date: 08/05/2016 CLINICAL DATA:  Bedside central venous catheter placement. EXAM: PORTABLE CHEST 1 VIEW 8:36 a.m.: COMPARISON:  Portable chest x-ray earlier this morning at 7:48 a.m. FINDINGS: A right jugular central venous catheter tip projects over the lower SVC. No evidence of pneumothorax or mediastinal hematoma. Endotracheal tube and OG tube unchanged in position. External pacing pads. Lungs remain clear. Decompression of the stomach since the earlier examination. IMPRESSION: 1. Right jugular central venous catheter tip projects over the lower SVC. No acute complicating features. 2. Remaining support apparatus satisfactory. 3.  No acute cardiopulmonary disease. Electronically Signed   By: Hulan Saas M.D.   On: 08/05/2016 08:48   Dg Chest Portable 1 View  Result Date: 08/05/2016 CLINICAL DATA:  Intubation. EXAM: PORTABLE CHEST 1 VIEW COMPARISON:  None. FINDINGS: Endotracheal tube tip in satisfactory position projecting approximately 3 cm above the carina. OG tube tip courses below the diaphragm  into the stomach. Cardiomediastinal silhouette unremarkable for AP portable technique. Lungs clear. Bronchovascular markings normal. Pulmonary vascularity normal. No visible pleural effusions. No pneumothorax. External pacing pads noted. IMPRESSION: 1. Endotracheal tube tip in satisfactory position projecting approximately 3 cm above the carina. 2.  No acute cardiopulmonary disease. Electronically Signed   By: Hulan Saas M.D.   On: 08/05/2016 08:13     Assessment/Plan:  35 y.o. female with multiple substance abuse found unresponsive at home by her husband. Utox positive for cocaine, benzodiazepines, opiates and amphetemines. Found to be in respiratory failure and s/p intubation.   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      Globus pallidus is part of the basal ganglia and functions to assist in voluntary movements Via dopamine projection. With lack of dopamine or injury it becomes difficulty to regulate planned movements.  I had a prolonged face to face meeting with patients family at bedside.  Explained the likelihood prognosis Pt is is localizing on exam but not following commands Hopefully will be  able to extubate her in next few days Agitation/withdrawl will be a big complement.  Will likely need Klonopin/precedex/seroquel or combination.   Families questions have been answered and imaging showed.   Critical care time spent 50 minutes.   Pauletta BrownsZEYLIKMAN, Keari Miu     08/06/2016, 3:26 PM

## 2016-08-06 NOTE — H&P (Signed)
PULMONARY / CRITICAL CARE MEDICINE   Name: Martha Lawson MRN: [id number] DOB: October 22, 1981    ADMISSION DATE:  08/05/2016  HISTORY OF PRESENT ILLNESS:   35 yo white female admitted to ICU for acute mental status changes -patient found unresponsive at home by husband, patient was using drugs +cocaine  Patient was foaming at the mouth, gcs<4 Patient with resp failure Patient was intubated in ER placed on Vent CVl placed, placed on vasopressors  Patient remains comatosed  9/16 remains intubated,remains delerious Family at bedside, weaning off sedation  REVIEW OF SYSTEMS:  Unable to provide due to critical illness    VITAL SIGNS: Temp:  [98.7 F (37.1 C)-100.9 F (38.3 C)] 98.7 F (37.1 C) (09/16 0400) Pulse Rate:  [95-116] 108 (09/16 0700) Resp:  [0-31] 18 (09/16 0700) BP: (64-145)/(37-105) 117/56 (09/16 0700) SpO2:  [0 %-100 %] 98 % (09/16 0840) FiO2 (%):  [28 %-35 %] 28 % (09/16 0840) Weight:  [227 lb 4.7 oz (103.1 kg)-231 lb 7.7 oz (105 kg)] 227 lb 4.7 oz (103.1 kg) (09/16 0500) HEMODYNAMICS:   VENTILATOR SETTINGS: Vent Mode: PSV FiO2 (%):  [28 %-35 %] 28 % Set Rate:  [18 bmp] 18 bmp Vt Set:  [450 mL] 450 mL PEEP:  [5 cmH20] 5 cmH20 Pressure Support:  [8 cmH20] 8 cmH20 Plateau Pressure:  [16 cmH20-19 cmH20] 16 cmH20 INTAKE / OUTPUT:  Intake/Output Summary (Last 24 hours) at 08/06/16 0850 Last data filed at 08/06/16 0600  Gross per 24 hour  Intake          4782.06 ml  Output             1245 ml  Net          3537.06 ml    PHYSICAL EXAMINATION: PHYSICAL EXAMINATION: Physical Examination:   GENERAL:critically ill appearing, +resp distress HEAD: Normocephalic, atraumatic.  EYES: Pupils equal, round, reactive to light.  No scleral icterus.  MOUTH: Moist mucosal membrane. NECK: Supple. No thyromegaly. No nodules. No JVD. c collar in place PULMONARY: Diffuse coarse rhonchi right sided +wheezes +rhonchi CARDIOVASCULAR: S1 and S2. Regular rate and rhythm. No  murmurs, rubs, or gallops.  GASTROINTESTINAL: Soft, nontender, +distended. No masses. Positive bowel sounds. No hepatosplenomegaly.  MUSCULOSKELETAL: No swelling, clubbing, or edema.  NEUROLOGIC: GCS<8T SKIN:intact,warm,dry    LABS:  CBC  Recent Labs Lab 08/05/16 0736 08/06/16 0430  WBC 12.9* 9.9  HGB 9.5* 11.7*  HCT 29.8* 33.3*  PLT 141* 94*   Coag's No results for input(s): APTT, INR in the last 168 hours. BMET  Recent Labs Lab 08/05/16 0736 08/06/16 0430  NA 139 140  K 5.5* 3.8  CL 106 109  CO2 21* 25  BUN 17 22*  CREATININE 1.82* 2.41*  GLUCOSE 78 105*   Electrolytes  Recent Labs Lab 08/05/16 0736 08/06/16 0430  CALCIUM 7.2* 6.9*   Sepsis Markers  Recent Labs Lab 08/05/16 0736 08/05/16 1039 08/06/16 0430  LATICACIDVEN 3.6* 5.4* 1.2   ABG  Recent Labs Lab 08/05/16 0756 08/05/16 1332 08/06/16 0500  PHART 7.30* 7.25* 7.38  PCO2ART 34 39 41  PO2ART 117* 130* 130*   Liver Enzymes  Recent Labs Lab 08/05/16 0736 08/06/16 0430  AST 319* 6,862*  ALT 395* 5,266*  ALKPHOS 36* 32*  BILITOT 0.7 1.3*  ALBUMIN 3.4* 2.9*   Cardiac Enzymes  Recent Labs Lab 08/05/16 0736  TROPONINI 0.83*   Glucose  Recent Labs Lab 08/05/16 0725 08/05/16 1211  GLUCAP 86 97    Imaging Ct  Head Wo Contrast  Result Date: 08/05/2016 CLINICAL DATA:  Unresponsive.  Found down. EXAM: CT HEAD WITHOUT CONTRAST CT CERVICAL SPINE WITHOUT CONTRAST TECHNIQUE: Multidetector CT imaging of the head and cervical spine was performed following the standard protocol without intravenous contrast. Multiplanar CT image reconstructions of the cervical spine were also generated. COMPARISON:  Head CT 05/22/2014 FINDINGS: CT HEAD FINDINGS Brain: The ventricles are normal in size and configuration. No extra-axial fluid collections are identified. The gray-white differentiation is normal. No CT findings for acute intracranial process such as hemorrhage or infarction. No mass  lesions. The brainstem and cerebellum are grossly normal. Vascular: No hyperdense vessel or unexpected calcification. Skull: Normal. Negative for fracture or focal lesion. Sinuses/Orbits: Minimal mucoperiosteal thickening involving the right half of the sphenoid sinus. Otherwise the paranasal sinuses and mastoid air cells are clear. The globes are intact. Other: No scalp lesion or hematoma. CT CERVICAL SPINE FINDINGS Alignment: Grossly normal.  Exam limited by motion artifact. Skull base and vertebrae: Grossly normal. Soft tissues and spinal canal: No abnormal prevertebral soft tissue swelling. An endotracheal tube and NG tube place. Disc levels: No spinal canal compromise. No large disc protrusions. Upper chest: Scattered areas noted in the neck. No definite pneumothorax. Other: None IMPRESSION: No acute intracranial findings or skull fracture. Limited C-spine by motion artifact but no obvious fracture or malalignment. Electronically Signed   By: Rudie MeyerP.  Gallerani M.D.   On: 08/05/2016 10:06   Ct Cervical Spine Wo Contrast  Result Date: 08/05/2016 CLINICAL DATA:  Unresponsive.  Found down. EXAM: CT HEAD WITHOUT CONTRAST CT CERVICAL SPINE WITHOUT CONTRAST TECHNIQUE: Multidetector CT imaging of the head and cervical spine was performed following the standard protocol without intravenous contrast. Multiplanar CT image reconstructions of the cervical spine were also generated. COMPARISON:  Head CT 05/22/2014 FINDINGS: CT HEAD FINDINGS Brain: The ventricles are normal in size and configuration. No extra-axial fluid collections are identified. The gray-white differentiation is normal. No CT findings for acute intracranial process such as hemorrhage or infarction. No mass lesions. The brainstem and cerebellum are grossly normal. Vascular: No hyperdense vessel or unexpected calcification. Skull: Normal. Negative for fracture or focal lesion. Sinuses/Orbits: Minimal mucoperiosteal thickening involving the right half of the  sphenoid sinus. Otherwise the paranasal sinuses and mastoid air cells are clear. The globes are intact. Other: No scalp lesion or hematoma. CT CERVICAL SPINE FINDINGS Alignment: Grossly normal.  Exam limited by motion artifact. Skull base and vertebrae: Grossly normal. Soft tissues and spinal canal: No abnormal prevertebral soft tissue swelling. An endotracheal tube and NG tube place. Disc levels: No spinal canal compromise. No large disc protrusions. Upper chest: Scattered areas noted in the neck. No definite pneumothorax. Other: None IMPRESSION: No acute intracranial findings or skull fracture. Limited C-spine by motion artifact but no obvious fracture or malalignment. Electronically Signed   By: Rudie MeyerP.  Gallerani M.D.   On: 08/05/2016 10:06   Mr Brain Wo Contrast  Result Date: 08/05/2016 CLINICAL DATA:  Patient with substance abuse found unresponsive earlier today. Respiratory failure. EXAM: MRI HEAD WITHOUT CONTRAST TECHNIQUE: Multiplanar, multiecho pulse sequences of the brain and surrounding structures were obtained without intravenous contrast. COMPARISON:  : CT head earlier today. FINDINGS: Brain: Marked restricted diffusion both globus pallidi consistent with anoxic injury. Other considerations could include cardiac arrest, severe blood loss, or carbon monoxide poisoning. Similar smaller focus restricted diffusion LEFT forceps minor corpus callosum. No hemorrhage, mass lesion, hydrocephalus, or extra-axial fluid. Vascular: Normal flow voids. Skull and upper cervical spine: Normal  marrow signal. Sinuses/Orbits: Negative. Other: None. 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 See discussion above. Smaller focus of acute infarction affects the LEFT corpus callosum. Electronically Signed   By: Elsie Stain M.D.   On: 08/05/2016 16:42   Ct Abdomen Pelvis W Contrast  Result Date: 08/05/2016 CLINICAL DATA:  Unresponsive.  EXAM: CT ABDOMEN AND PELVIS WITH CONTRAST TECHNIQUE: Multidetector CT imaging of the abdomen and pelvis was performed using the standard protocol following bolus administration of intravenous contrast. CONTRAST:  80mL ISOVUE-300 IOPAMIDOL (ISOVUE-300) INJECTION 61% COMPARISON:  None. FINDINGS: Lower chest: No acute abnormality. Hepatobiliary: There is diffuse hepatic steatosis. There is relative hypertrophy of the lateral segment of left lobe and caudate lobe. No focal liver abnormality noted. Ascites is identified around the right lateral margin of the liver. There is pericholecystic fluid noted. Pancreas: Unremarkable. No pancreatic ductal dilatation or surrounding inflammatory changes. Spleen: Normal in size without focal abnormality. Adrenals/Urinary Tract: The adrenal glands are normal. There are small bilateral renal cysts identified. No mass or hydronephrosis. The urinary bladder appears collapsed around a Foley catheter balloon. Stomach/Bowel: The stomach is normal. Nasogastric tube tip is in the body of the stomach. The small bowel loops have a normal course and caliber. The appendix is visualized and appears normal. Normal appearance of the colon. Vascular/Lymphatic: Normal appearance of the abdominal aorta. No enlarged retroperitoneal or mesenteric adenopathy. No enlarged pelvic or inguinal lymph nodes. Reproductive: Uterus and bilateral adnexa are unremarkable. Other: There is soft tissue edema within the upper abdomen in the region of the porta hepatis. Small amount of ascites identified. Musculoskeletal: There is no aggressive lytic or sclerotic bone lesions identified. IMPRESSION: 1. Hepatic steatosis is identified as well as perihepatic and pericholecystic ascites. 2. No evidence for abdominal or pelvic abscess or evidence of bowel obstruction. Electronically Signed   By: Signa Kell M.D.   On: 08/05/2016 10:21   Dg Chest Port 1 View  Result Date: 08/06/2016 CLINICAL DATA:  Intubated patient  EXAM: PORTABLE CHEST 1 VIEW COMPARISON:  08/05/2016 FINDINGS: Endotracheal tube, NG tube and central venous line unchanged. Mild central venous congestion. No pneumothorax or infiltrate. IMPRESSION: Stable support apparatus. Central venous congestion, mild. Electronically Signed   By: Genevive Bi M.D.   On: 08/06/2016 07:09     ASSESSMENT / PLAN:  35 yo white female admitted to ICU for acute encephalopathy from cocaine toxicity with neurogenic/cardiogenic shock with severe metabolic acidosis fings concerning for  probable anoxic brain injury, with elevated liver enzymes   PULMONARY -Respiratory Failure -continue Full MV support -continue Bronchodilator Therapy -Wean Fio2 and PEEP as tolerated    CARDIOVASCULAR Shock-neurogenic/cardiogenic Vasopressors to keep MAP>65   GASTROINTESTINAL OGT, started TF's Check Hep profile  HEMATOLOGIC follow CBC  INFECTIOUS No abx at this time  ENDOCRINE - ICU hypoglycemic\Hyperglycemia protocol  NEUROLOGIC Follow up neurology consult recs    I have personally obtained a history, examined the patient, evaluated Pertinent laboratory and RadioGraphic/imaging results, and  formulated the assessment and plan   The Patient requires high complexity decision making for assessment and support, frequent evaluation and titration of therapies, application of advanced monitoring technologies and extensive interpretation of multiple databases. Critical Care Time devoted to patient care services described in this note is 35 minutes.   Overall, patient is critically ill, prognosis is guarded.  Patient with Multiorgan failure and at high risk for cardiac arrest and death.    Lucie Leather, M.D.  [facility]  Medical Director [place] Director [facility] Cardio-Pulmonary Department

## 2016-08-07 ENCOUNTER — Inpatient Hospital Stay: Payer: Medicaid Other

## 2016-08-07 LAB — BLOOD GAS, ARTERIAL
ACID-BASE EXCESS: 0.8 mmol/L (ref 0.0–2.0)
Bicarbonate: 25.3 mmol/L (ref 20.0–28.0)
FIO2: 24
Mechanical Rate: 18
O2 Saturation: 97.7 %
PATIENT TEMPERATURE: 37
PCO2 ART: 39 mmHg (ref 32.0–48.0)
PEEP: 5 cmH2O
VT: 450 mL
pH, Arterial: 7.42 (ref 7.350–7.450)
pO2, Arterial: 98 mmHg (ref 83.0–108.0)

## 2016-08-07 LAB — COMPREHENSIVE METABOLIC PANEL
ALBUMIN: 2.7 g/dL — AB (ref 3.5–5.0)
ALK PHOS: 35 U/L — AB (ref 38–126)
ALT: 4890 U/L — AB (ref 14–54)
AST: 3096 U/L — AB (ref 15–41)
Anion gap: 5 (ref 5–15)
BILIRUBIN TOTAL: 1.1 mg/dL (ref 0.3–1.2)
BUN: 34 mg/dL — AB (ref 6–20)
CALCIUM: 7.4 mg/dL — AB (ref 8.9–10.3)
CO2: 24 mmol/L (ref 22–32)
Chloride: 110 mmol/L (ref 101–111)
Creatinine, Ser: 2.14 mg/dL — ABNORMAL HIGH (ref 0.44–1.00)
GFR calc Af Amer: 34 mL/min — ABNORMAL LOW (ref >60)
GFR calc non Af Amer: 29 mL/min — ABNORMAL LOW (ref >60)
GLUCOSE: 138 mg/dL — AB (ref 65–99)
Potassium: 3.2 mmol/L — ABNORMAL LOW (ref 3.5–5.1)
SODIUM: 139 mmol/L (ref 135–145)
TOTAL PROTEIN: 5.3 g/dL — AB (ref 6.5–8.1)

## 2016-08-07 LAB — CBC
HEMATOCRIT: 31.4 % — AB (ref 35.0–47.0)
HEMOGLOBIN: 10.9 g/dL — AB (ref 12.0–16.0)
MCH: 31.7 pg (ref 26.0–34.0)
MCHC: 34.6 g/dL (ref 32.0–36.0)
MCV: 91.6 fL (ref 80.0–100.0)
Platelets: 76 10*3/uL — ABNORMAL LOW (ref 150–440)
RBC: 3.43 MIL/uL — AB (ref 3.80–5.20)
RDW: 13.3 % (ref 11.5–14.5)
WBC: 9 10*3/uL (ref 3.6–11.0)

## 2016-08-07 LAB — GLUCOSE, CAPILLARY
GLUCOSE-CAPILLARY: 130 mg/dL — AB (ref 65–99)
GLUCOSE-CAPILLARY: 121 mg/dL — AB (ref 65–99)
Glucose-Capillary: 107 mg/dL — ABNORMAL HIGH (ref 65–99)
Glucose-Capillary: 123 mg/dL — ABNORMAL HIGH (ref 65–99)
Glucose-Capillary: 124 mg/dL — ABNORMAL HIGH (ref 65–99)
Glucose-Capillary: 133 mg/dL — ABNORMAL HIGH (ref 65–99)

## 2016-08-07 LAB — PHOSPHORUS
PHOSPHORUS: 2.1 mg/dL — AB (ref 2.5–4.6)
Phosphorus: 2 mg/dL — ABNORMAL LOW (ref 2.5–4.6)

## 2016-08-07 LAB — MAGNESIUM
MAGNESIUM: 2.2 mg/dL (ref 1.7–2.4)
Magnesium: 2.2 mg/dL (ref 1.7–2.4)

## 2016-08-07 LAB — HEPATITIS PANEL, ACUTE
Hep A IgM: NEGATIVE
Hep B C IgM: NEGATIVE
Hepatitis B Surface Ag: NEGATIVE

## 2016-08-07 NOTE — Progress Notes (Signed)
At 0745 neuro exam done with precedex gtt off for 15 min. Pt raised eyebrows and barely opened eyes to command "Martha Lawson open you eyes".  Safety mitts removed and pt asked to "give thumbs up".  Pt able to give thumbs up strongly with left hand x 2. Right hand thumbs up weaker.  She also clearly wiggled toes only to commands bilaterally x2.  But 15 min later when Dr Belia HemanKasa in room pt did not respond to commands.   At 0845 we gave pt a bath.  She tried weakly to assist with turn to right side. Husband returned to bedside and pt nodded yes to "are you cold?"  "Do you hear me Martha Lawson?'

## 2016-08-07 NOTE — Consult Note (Signed)
Reason for Consult: not waking up Referring Physician: Dr. Belia Heman  CC: pt found down   HPI: Martha Lawson is an 35 y.o. female with multiple substance abuse found unresponsive at home by her husband. Utox positive for cocaine, benzodiazepines, opiates and amphetemines. Found to be in respiratory failure and s/p intubation.   CTH no acute abnormality  MRI brain b/l globus pallidus injury.    No change in examination. Withdraws but only intermittently following    Past Medical History:  Diagnosis Date  . Asthma     History reviewed. No pertinent surgical history.  History reviewed. No pertinent family history.  Social History:  reports that she has been smoking.  She uses smokeless tobacco. She reports that she drinks alcohol. She reports that she uses drugs.  Allergies  Allergen Reactions  . Aspirin     Medications: I have reviewed the patient's current medications.  ROS: Not able to obtain   Physical Examination: Blood pressure 118/81, pulse 70, temperature 99 F (37.2 C), temperature source Oral, resp. rate (!) 21, height 5\' 5"  (1.651 m), weight 104.8 kg (231 lb 0.7 oz), SpO2 98 %.  Pt is still on ventilator  Does not follow commands Localizing b/l upper and lower etremities   Laboratory Studies:   Basic Metabolic Panel:  Recent Labs Lab 08/05/16 0736 08/06/16 0430 08/06/16 1419 08/07/16 0520 08/07/16 1658  NA 139 140  --  139  --   K 5.5* 3.8  --  3.2*  --   CL 106 109  --  110  --   CO2 21* 25  --  24  --   GLUCOSE 78 105*  --  138*  --   BUN 17 22*  --  34*  --   CREATININE 1.82* 2.41*  --  2.14*  --   CALCIUM 7.2* 6.9*  --  7.4*  --   MG  --   --  2.0 2.2 2.2  PHOS  --   --  2.8 2.1* 2.0*    Liver Function Tests:  Recent Labs Lab 08/05/16 0736 08/06/16 0430 08/07/16 0520  AST 319* 6,862* 3,096*  ALT 395* 5,266* 4,890*  ALKPHOS 36* 32* 35*  BILITOT 0.7 1.3* 1.1  PROT 6.6 5.4* 5.3*  ALBUMIN 3.4* 2.9* 2.7*   No results for input(s): LIPASE,  AMYLASE in the last 168 hours. No results for input(s): AMMONIA in the last 168 hours.  CBC:  Recent Labs Lab 08/05/16 0736 08/06/16 0430 08/07/16 0520  WBC 12.9* 9.9 9.0  NEUTROABS 11.4*  --   --   HGB 9.5* 11.7* 10.9*  HCT 29.8* 33.3* 31.4*  MCV 95.9 91.2 91.6  PLT 141* 94* 76*    Cardiac Enzymes:  Recent Labs Lab 08/05/16 0736  TROPONINI 0.83*    BNP: Invalid input(s): POCBNP  CBG:  Recent Labs Lab 08/06/16 2350 08/07/16 0356 08/07/16 0741 08/07/16 1136 08/07/16 1538  GLUCAP 132* 130* 121* 107* 123*    Microbiology: Results for orders placed or performed during the hospital encounter of 08/05/16  Blood culture (routine x 2)     Status: None (Preliminary result)   Collection Time: 08/05/16  9:12 AM  Result Value Ref Range Status   Specimen Description BLOOD CENTRAL LINE  Final   Special Requests BOTTLES DRAWN AEROBIC AND ANAEROBIC  4CC  Final   Culture NO GROWTH 2 DAYS  Final   Report Status PENDING  Incomplete  Blood culture (routine x 2)     Status:  None (Preliminary result)   Collection Time: 08/05/16  9:12 AM  Result Value Ref Range Status   Specimen Description BLOOD RIGHT ARM  Final   Special Requests BOTTLES DRAWN AEROBIC AND ANAEROBIC  5CC  Final   Culture NO GROWTH 2 DAYS  Final   Report Status PENDING  Incomplete  MRSA PCR Screening     Status: Abnormal   Collection Time: 08/05/16 12:25 PM  Result Value Ref Range Status   MRSA by PCR POSITIVE (A) NEGATIVE Final    Comment:        The GeneXpert MRSA Assay (FDA approved for NASAL specimens only), is one component of a comprehensive MRSA colonization surveillance program. It is not intended to diagnose MRSA infection nor to guide or monitor treatment for MRSA infections. RESULT CALLED TO, READ BACK BY AND VERIFIED WITH: RACHEL VERDI 08/05/16 1348 SGD   Culture, expectorated sputum-assessment     Status: None   Collection Time: 08/05/16  1:32 PM  Result Value Ref Range Status    Specimen Description EXPECTORATED SPUTUM  Final   Special Requests NONE  Final   Sputum evaluation THIS SPECIMEN IS ACCEPTABLE FOR SPUTUM CULTURE  Final   Report Status 08/05/2016 FINAL  Final  Culture, respiratory (NON-Expectorated)     Status: None (Preliminary result)   Collection Time: 08/05/16  1:32 PM  Result Value Ref Range Status   Specimen Description EXPECTORATED SPUTUM  Final   Special Requests NONE Reflexed from [id number]  Final   Gram Stain   Final    ABUNDANT WBC PRESENT, PREDOMINANTLY PMN ABUNDANT GRAM POSITIVE COCCI IN CLUSTERS MODERATE GRAM POSITIVE COCCI IN PAIRS AND CHAINS FEW GRAM POSITIVE RODS FEW GRAM NEGATIVE COCCOBACILLI    Culture   Final    CULTURE REINCUBATED FOR BETTER GROWTH [facility]    Report Status PENDING  Incomplete    Coagulation Studies: No results for input(s): LABPROT, INR in the last 72 hours.  Urinalysis:   Recent Labs Lab 08/05/16 0736  COLORURINE YELLOW*  LABSPEC 1.016  PHURINE 6.0  GLUCOSEU 50*  HGBUR 3+*  BILIRUBINUR NEGATIVE  KETONESUR NEGATIVE  PROTEINUR >500*  NITRITE NEGATIVE  LEUKOCYTESUR NEGATIVE    Lipid Panel:     Component Value Date/Time   TRIG 101 08/05/2016 0736    HgbA1C: No results found for: HGBA1C  Urine Drug Screen:      Component Value Date/Time   LABOPIA POSITIVE (A) 08/05/2016 0736   COCAINSCRNUR POSITIVE (A) 08/05/2016 0736   LABBENZ POSITIVE (A) 08/05/2016 0736   AMPHETMU POSITIVE (A) 08/05/2016 0736   THCU NONE DETECTED 08/05/2016 0736   LABBARB NONE DETECTED 08/05/2016 0736    Alcohol Level:   Recent Labs Lab 08/05/16 0736  ETH <5    Other results: EKG: normal EKG, normal sinus rhythm, unchanged from previous tracings.  Imaging: Dg Chest Port 1 View  Result Date: 08/07/2016 CLINICAL DATA:  Respiratory failure. EXAM: PORTABLE CHEST 1 VIEW COMPARISON:  08/06/2016. FINDINGS: Cardiomegaly. Unchanged endotracheal tube, and central venous catheter. Low lung  volumes with mild vascular congestion. No infiltrates or failure. IMPRESSION: Stable cardiomegaly, support apparatus and mild vascular congestion. Electronically Signed   By: Elsie StainJohn T Curnes M.D.   On: 08/07/2016 08:51   Dg Chest Port 1 View  Result Date: 08/06/2016 CLINICAL DATA:  Intubated patient EXAM: PORTABLE CHEST 1 VIEW COMPARISON:  08/05/2016 FINDINGS: Endotracheal tube, NG tube and central venous line unchanged. Mild central venous congestion. No pneumothorax or infiltrate. IMPRESSION: Stable support apparatus.  Central venous congestion, mild. Electronically Signed   By: Genevive Bi M.D.   On: 08/06/2016 07:09     Assessment/Plan:  35 y.o. female with multiple substance abuse found unresponsive at home by her husband. Utox positive for cocaine, benzodiazepines, opiates and amphetemines. Found to be in respiratory failure and s/p intubation.   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      Globus pallidus is part of the basal ganglia and functions to assist in voluntary movements Via dopamine projection. With lack of dopamine or injury it becomes difficulty to regulate planned movements.   No change in examination. Withdraws but only intermittently following    I had a prolonged face to face meeting with patients family at bedside.  Explained the likelihood prognosis Pt is is localizing on exam but not following commands Hopefully will be able to extubate her in next few days Agitation/withdrawl will be a big complement.  Will likely need Klonopin/precedex/seroquel or combination.   Families questions have been answered and imaging showed yesterday     Elevated LFTs initially now starting to trend down.    Pauletta Browns   08/07/2016, 7:10 PM

## 2016-08-07 NOTE — Progress Notes (Signed)
PULMONARY / CRITICAL CARE MEDICINE   Name: Martha Lawson MRN: [id number] DOB: [birth year]    ADMISSION DATE:  08/05/2016  HISTORY OF PRESENT ILLNESS:   35 yo white female admitted to ICU for acute mental status changes -patient found unresponsive at home by husband, patient was using drugs +cocaine  Patient was foaming at the mouth, gcs<4 Patient with resp failure Patient was intubated in ER placed on Vent CVl placed, placed on vasopressors  Patient remains comatosed  9/16 remains intubated,remains delerious Family at bedside, weaning off sedation  9/17 remains intubated,remains delerious  REVIEW OF SYSTEMS:  Unable to provide due to critical illness    VITAL SIGNS: Temp:  [98.5 F (36.9 C)-100 F (37.8 C)] 98.8 F (37.1 C) (09/17 0730) Pulse Rate:  [66-112] 66 (09/17 0700) Resp:  [18-25] 20 (09/17 0700) BP: (90-122)/(53-77) 99/68 (09/17 0730) SpO2:  [98 %-100 %] 99 % (09/17 0700) FiO2 (%):  [24 %-28 %] 24 % (09/17 0400) Weight:  [231 lb 0.7 oz (104.8 kg)] 231 lb 0.7 oz (104.8 kg) (09/17 0450) HEMODYNAMICS:   VENTILATOR SETTINGS: Vent Mode: PRVC FiO2 (%):  [24 %-28 %] 24 % Set Rate:  [18 bmp] 18 bmp Vt Set:  [450 mL] 450 mL PEEP:  [5 cmH20] 5 cmH20 Pressure Support:  [8 cmH20] 8 cmH20 Plateau Pressure:  [15 cmH20-18 cmH20] 16 cmH20 INTAKE / OUTPUT:  Intake/Output Summary (Last 24 hours) at 08/07/16 0826 Last data filed at 08/07/16 0800  Gross per 24 hour  Intake          3566.37 ml  Output             2010 ml  Net          1556.37 ml    PHYSICAL EXAMINATION: PHYSICAL EXAMINATION: Physical Examination:   GENERAL:critically ill appearing, +resp distress HEAD: Normocephalic, atraumatic.  EYES: Pupils equal, round, reactive to light.  No scleral icterus.  MOUTH: Moist mucosal membrane. NECK: Supple. No thyromegaly. No nodules. No JVD. c collar in place PULMONARY: Diffuse coarse rhonchi right sided +wheezes +rhonchi CARDIOVASCULAR: S1 and S2. Regular  rate and rhythm. No murmurs, rubs, or gallops.  GASTROINTESTINAL: Soft, nontender, +distended. No masses. Positive bowel sounds. No hepatosplenomegaly.  MUSCULOSKELETAL: No swelling, clubbing, or edema.  NEUROLOGIC: GCS<8T SKIN:intact,warm,dry    LABS:  CBC  Recent Labs Lab 08/05/16 0736 08/06/16 0430 08/07/16 0520  WBC 12.9* 9.9 9.0  HGB 9.5* 11.7* 10.9*  HCT 29.8* 33.3* 31.4*  PLT 141* 94* 76*   Coag's No results for input(s): APTT, INR in the last 168 hours. BMET  Recent Labs Lab 08/05/16 0736 08/06/16 0430 08/07/16 0520  NA 139 140 139  K 5.5* 3.8 3.2*  CL 106 109 110  CO2 21* 25 24  BUN 17 22* 34*  CREATININE 1.82* 2.41* 2.14*  GLUCOSE 78 105* 138*   Electrolytes  Recent Labs Lab 08/05/16 0736 08/06/16 0430 08/06/16 1419 08/07/16 0520  CALCIUM 7.2* 6.9*  --  7.4*  MG  --   --  2.0 2.2  PHOS  --   --  2.8 2.1*   Sepsis Markers  Recent Labs Lab 08/05/16 0736 08/05/16 1039 08/06/16 0430  LATICACIDVEN 3.6* 5.4* 1.2   ABG  Recent Labs Lab 08/06/16 0500 08/06/16 1125 08/07/16 0454  PHART 7.38 7.39 7.42  PCO2ART 41 41 39  PO2ART 130* 105 98   Liver Enzymes  Recent Labs Lab 08/05/16 0736 08/06/16 0430 08/07/16 0520  AST 319* 6,862* 3,096*  ALT 395*  5,266* 4,890*  ALKPHOS 36* 32* 35*  BILITOT 0.7 1.3* 1.1  ALBUMIN 3.4* 2.9* 2.7*   Cardiac Enzymes  Recent Labs Lab 08/05/16 0736  TROPONINI 0.83*   Glucose  Recent Labs Lab 08/05/16 1211 08/06/16 1553 08/06/16 1942 08/06/16 2350 08/07/16 0356 08/07/16 0741  GLUCAP 97 107* 122* 132* 130* 121*    Imaging No results found.   ASSESSMENT / PLAN:  35 yo white female admitted to ICU for acute encephalopathy from cocaine toxicity with neurogenic/cardiogenic shock with severe metabolic acidosis fings concerning for  probable anoxic brain injury, with elevated liver enzymes   PULMONARY -Respiratory Failure-unable to protect airway -continue Full MV support -continue  Bronchodilator Therapy -Wean Fio2 and PEEP as tolerated    CARDIOVASCULAR Shock-neurogenic/cardiogenic Vasopressors to keep MAP>65 as needed   GASTROINTESTINAL OGT, started TF's Hep profile negative Follow LFT's  HEMATOLOGIC follow CBC  INFECTIOUS No abx at this time  ENDOCRINE - ICU hypoglycemic\Hyperglycemia protocol  NEUROLOGIC Follow up neurology consult recs    I have personally obtained a history, examined the patient, evaluated Pertinent laboratory and RadioGraphic/imaging results, and  formulated the assessment and plan   The Patient requires high complexity decision making for assessment and support, frequent evaluation and titration of therapies, application of advanced monitoring technologies and extensive interpretation of multiple databases. Critical Care Time devoted to patient care services described in this note is 35 minutes.   Overall, patient is critically ill, prognosis is guarded.  Patient with Multiorgan failure and at high risk for cardiac arrest and death.    Lucie LeatherKurian David Izsak Meir, M.D.  [facility]  Medical Director [facility] Medical Director [facility]

## 2016-08-07 NOTE — Progress Notes (Deleted)
Pt O2 sat on ROOM AIR AND REST is 88-92 % and RR is 24-28/min.  Pt O2 sat on ROOM AIR and WALKING is 86-91% with RR 32-40/min. Pt O2 sat on 2L/[place] at rest is 93-95% an RR 24-28/min. Pt O2 sat on 2L/[place] on exertion is 90% and RR 28/min

## 2016-08-07 NOTE — Progress Notes (Signed)
Has followed a few commands at intervals today.  Precedex was off for several hours. And was recently increased to 0.34mcg due to agitation and bucking vent breaths.  Infrequently, she nods head to questions.  Numerous family members in to see pt.

## 2016-08-08 DIAGNOSIS — J0410 Acute tracheitis without obstruction: Secondary | ICD-10-CM

## 2016-08-08 DIAGNOSIS — G931 Anoxic brain damage, not elsewhere classified: Secondary | ICD-10-CM

## 2016-08-08 LAB — BASIC METABOLIC PANEL
ANION GAP: 5 (ref 5–15)
BUN: 27 mg/dL — ABNORMAL HIGH (ref 6–20)
CALCIUM: 7.7 mg/dL — AB (ref 8.9–10.3)
CO2: 27 mmol/L (ref 22–32)
Chloride: 110 mmol/L (ref 101–111)
Creatinine, Ser: 1.56 mg/dL — ABNORMAL HIGH (ref 0.44–1.00)
GFR, EST AFRICAN AMERICAN: 50 mL/min — AB (ref >60)
GFR, EST NON AFRICAN AMERICAN: 43 mL/min — AB (ref >60)
Glucose, Bld: 137 mg/dL — ABNORMAL HIGH (ref 65–99)
POTASSIUM: 3 mmol/L — AB (ref 3.5–5.1)
Sodium: 142 mmol/L (ref 135–145)

## 2016-08-08 LAB — CBC
HCT: 33.2 % — ABNORMAL LOW (ref 35.0–47.0)
HEMOGLOBIN: 11.5 g/dL — AB (ref 12.0–16.0)
MCH: 31.8 pg (ref 26.0–34.0)
MCHC: 34.8 g/dL (ref 32.0–36.0)
MCV: 91.5 fL (ref 80.0–100.0)
Platelets: 96 10*3/uL — ABNORMAL LOW (ref 150–440)
RBC: 3.63 MIL/uL — AB (ref 3.80–5.20)
RDW: 13.1 % (ref 11.5–14.5)
WBC: 9.6 10*3/uL (ref 3.6–11.0)

## 2016-08-08 LAB — EXPECTORATED SPUTUM ASSESSMENT W REFEX TO RESP CULTURE

## 2016-08-08 LAB — GLUCOSE, CAPILLARY
GLUCOSE-CAPILLARY: 143 mg/dL — AB (ref 65–99)
GLUCOSE-CAPILLARY: 134 mg/dL — AB (ref 65–99)
GLUCOSE-CAPILLARY: 92 mg/dL (ref 65–99)
GLUCOSE-CAPILLARY: 93 mg/dL (ref 65–99)
Glucose-Capillary: 124 mg/dL — ABNORMAL HIGH (ref 65–99)

## 2016-08-08 LAB — EXPECTORATED SPUTUM ASSESSMENT W GRAM STAIN, RFLX TO RESP C: Special Requests: NORMAL

## 2016-08-08 LAB — PHOSPHORUS: PHOSPHORUS: 2.3 mg/dL — AB (ref 2.5–4.6)

## 2016-08-08 LAB — MAGNESIUM: MAGNESIUM: 2 mg/dL (ref 1.7–2.4)

## 2016-08-08 MED ORDER — SODIUM PHOSPHATES 45 MMOLE/15ML IV SOLN
10.0000 mmol | Freq: Once | INTRAVENOUS | Status: AC
  Administered 2016-08-08: 10 mmol via INTRAVENOUS
  Filled 2016-08-08: qty 3.33

## 2016-08-08 MED ORDER — POTASSIUM CHLORIDE 10 MEQ/100ML IV SOLN
10.0000 meq | INTRAVENOUS | Status: AC
  Administered 2016-08-08 (×3): 10 meq via INTRAVENOUS
  Filled 2016-08-08 (×3): qty 100

## 2016-08-08 MED ORDER — K PHOS MONO-SOD PHOS DI & MONO 155-852-130 MG PO TABS
250.0000 mg | ORAL_TABLET | Freq: Once | ORAL | Status: AC
  Administered 2016-08-08: 250 mg via NASOGASTRIC
  Filled 2016-08-08: qty 1

## 2016-08-08 MED ORDER — SODIUM CHLORIDE 0.9 % IV SOLN
3.0000 g | Freq: Four times a day (QID) | INTRAVENOUS | Status: DC
  Administered 2016-08-08 – 2016-08-10 (×8): 3 g via INTRAVENOUS
  Filled 2016-08-08 (×11): qty 3

## 2016-08-08 MED ORDER — MUPIROCIN 2 % EX OINT
1.0000 "application " | TOPICAL_OINTMENT | Freq: Two times a day (BID) | CUTANEOUS | Status: DC
  Administered 2016-08-08 – 2016-08-12 (×9): 1 via NASAL
  Filled 2016-08-08: qty 22

## 2016-08-08 MED ORDER — POTASSIUM CHLORIDE 20 MEQ/15ML (10%) PO SOLN
30.0000 meq | ORAL | Status: AC
  Administered 2016-08-08: 30 meq
  Filled 2016-08-08 (×2): qty 22.5

## 2016-08-08 MED ORDER — ORAL CARE MOUTH RINSE
15.0000 mL | Freq: Two times a day (BID) | OROMUCOSAL | Status: DC
  Administered 2016-08-09 – 2016-08-11 (×3): 15 mL via OROMUCOSAL

## 2016-08-08 NOTE — Progress Notes (Addendum)
PULMONARY / CRITICAL CARE MEDICINE   Name: Martha Lawson MRN: [id number] DOB: 13-Apr-1981    ADMISSION DATE:  08/05/2016  HISTORY OF PRESENT ILLNESS:   36 yo white female admitted to ICU for acute mental status changes -patient found unresponsive at home by husband, patient was using drugs +cocaine  Patient was foaming at the mouth, gcs<4 Patient with resp failure Patient was intubated in ER placed on Vent CVl placed, placed on vasopressors  Patient remains comatosed  9/16 remains intubated,remains delerious Family at bedside, weaning off sedation  9/17 remains intubated,remains delerious 9/18 self extubated  SUBJECTIVE: Patient self extubated this morning, noted to have thick foul-smelling secretions.  REVIEW OF SYSTEMS:  Unable to provide due to critical illness    VITAL SIGNS: Temp:  [98.4 F (36.9 C)-99.8 F (37.7 C)] 99.2 F (37.3 C) (09/18 0800) Pulse Rate:  [65-81] 81 (09/18 1100) Resp:  [18-31] 31 (09/18 1100) BP: (105-142)/(66-99) 139/95 (09/18 1100) SpO2:  [95 %-100 %] 95 % (09/18 1100) FiO2 (%):  [24 %-28 %] 24 % (09/18 0809) Weight:  [234 lb 12.6 oz (106.5 kg)] 234 lb 12.6 oz (106.5 kg) (09/18 0500) HEMODYNAMICS:   VENTILATOR SETTINGS: Vent Mode: PRVC FiO2 (%):  [24 %-28 %] 24 % Set Rate:  [18 bmp] 18 bmp Vt Set:  [450 mL] 450 mL PEEP:  [5 cmH20] 5 cmH20 Plateau Pressure:  [10 cmH20-18 cmH20] 10 cmH20 INTAKE / OUTPUT:  Intake/Output Summary (Last 24 hours) at 08/08/16 1143 Last data filed at 08/08/16 0943  Gross per 24 hour  Intake          3830.66 ml  Output             2330 ml  Net          1500.66 ml    PHYSICAL EXAMINATION: PHYSICAL EXAMINATION: Physical Examination:   GENERAL:critically ill appearing, +resp distress HEAD: Normocephalic, atraumatic.  EYES: Pupils equal, round, reactive to light.  No scleral icterus.  MOUTH: Moist mucosal membrane. NECK: Supple. No thyromegaly. No nodules. No JVD. c collar in place PULMONARY:  Diffuse coarse rhonchi right sided +wheezes +rhonchi CARDIOVASCULAR: S1 and S2. Regular rate and rhythm. No murmurs, rubs, or gallops.  GASTROINTESTINAL: Soft, nontender, +distended. No masses. Positive bowel sounds. No hepatosplenomegaly.  MUSCULOSKELETAL: No swelling, clubbing, or edema.  NEUROLOGIC: post extubation - follows simple commands SKIN:intact,warm,dry    LABS:  CBC  Recent Labs Lab 08/06/16 0430 08/07/16 0520 08/08/16 0445  WBC 9.9 9.0 9.6  HGB 11.7* 10.9* 11.5*  HCT 33.3* 31.4* 33.2*  PLT 94* 76* 96*   Coag's No results for input(s): APTT, INR in the last 168 hours. BMET  Recent Labs Lab 08/06/16 0430 08/07/16 0520 08/08/16 0445  NA 140 139 142  K 3.8 3.2* 3.0*  CL 109 110 110  CO2 25 24 27   BUN 22* 34* 27*  CREATININE 2.41* 2.14* 1.56*  GLUCOSE 105* 138* 137*   Electrolytes  Recent Labs Lab 08/06/16 0430  08/07/16 0520 08/07/16 1658 08/08/16 0445  CALCIUM 6.9*  --  7.4*  --  7.7*  MG  --   < > 2.2 2.2 2.0  PHOS  --   < > 2.1* 2.0* 2.3*  < > = values in this interval not displayed. Sepsis Markers  Recent Labs Lab 08/05/16 0736 08/05/16 1039 08/06/16 0430  LATICACIDVEN 3.6* 5.4* 1.2   ABG  Recent Labs Lab 08/06/16 0500 08/06/16 1125 08/07/16 0454  PHART 7.38 7.39 7.42  PCO2ART 41 41  39  PO2ART 130* 105 98   Liver Enzymes  Recent Labs Lab 08/05/16 0736 08/06/16 0430 08/07/16 0520  AST 319* 6,862* 3,096*  ALT 395* 5,266* 4,890*  ALKPHOS 36* 32* 35*  BILITOT 0.7 1.3* 1.1  ALBUMIN 3.4* 2.9* 2.7*   Cardiac Enzymes  Recent Labs Lab 08/05/16 0736  TROPONINI 0.83*   Glucose  Recent Labs Lab 08/07/16 1136 08/07/16 1538 08/07/16 2020 08/07/16 2353 08/08/16 0421 08/08/16 0718  GLUCAP 107* 123* 133* 124* 143* 134*    Imaging No results found. 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 See discussion above. Smaller  focus of acute infarction affects the LEFT corpus callosum.  ASSESSMENT / PLAN:  35 yo white female admitted to ICU for acute encephalopathy from cocaine toxicity with neurogenic/cardiogenic shock with severe metabolic acidosis fings concerning for  probable anoxic brain injury, with elevated liver enzymes   PULMONARY -Respiratory Failure- Self extubated 9/18 - Continue to monitor respiratory status, high risk for reintubation -continue Bronchodilator Therapy - Thick foul-smelling purulent secretions, this was noted before self extubation, will empirically start Unasyn possible tracheitis - Obtain respiratory culture    CARDIOVASCULAR Shock-neurogenic/cardiogenic - resolved Vasopressors to keep MAP>65 as needed   GASTROINTESTINAL Shocked Liver - possible due to unresponsive and downtime -Follow LFT's - trending down -Hep profile negative  HEMATOLOGIC follow CBC  INFECTIOUS ? Tracheitis - Unasyn 9/18> - resp cx >  ENDOCRINE - ICU hypoglycemic\Hyperglycemia protocol  NEUROLOGIC Possible anoxic injury - per MRI, GP injury bilaterally -Follow up neurology consult recs, there is some GP injury, clinical outcome depending on the extent of damage and recovery process.   Patient self extubated this morning, overall still with moderate depressed neuro status, however on Precedex, evaluated by neurology starting to follow simple commands such as moving hands and feet but cannot perform detailed commands yet such as showing 2 fingers. Tenuous respiratory status given her self extubation, noted to have moderate purulent follow her smelling sputum, started on Unasyn.  Plan today is to monitor neuro status, continue gentle IV fluids as needed, may use Precedex for agitation if needed.  I have personally obtained a history, examined the patient, evaluated Pertinent laboratory and RadioGraphic/imaging results, and  formulated the assessment and plan   The Patient requires high  complexity decision making for assessment and support, frequent evaluation and titration of therapies, application of advanced monitoring technologies and extensive interpretation of multiple databases. Critical Care Time devoted to patient care services described in this note is 35 minutes.   Overall, patient is critically ill, prognosis is guarded.  Patient with Multiorgan failure and at high risk for cardiac arrest and death.    Lucie LeatherKurian David Kasa, M.D.  [facility]  Medical Director [facility] Medical Director [facility]

## 2016-08-08 NOTE — Progress Notes (Signed)
Brief Nutrition Noted:   Pt s/p self-extubation this AM, TF discontinued with extubation, Currently NPO, on room air.   Await diet progression post self-extubation  Romelle Starcherate Alizey Noren MS, RD, LDN [phone] Pager  [phone] Weekend/On-Call Pager

## 2016-08-08 NOTE — Progress Notes (Signed)
[facility] FOR [facility] LAB REPLACEMENT ONLY  The patient does [facility] the [facility] Protocol based on the criteria listed below:   1. Is GFR >/= 40 ml/min? Yes.    Patient's GFR today is 43 2. Is urine output >/= 0.5 ml/kg/hr for the last 6 hours? Yes.   Patient's UOP is 1.1 ml/kg/hr 3. Is BUN < 60 mg/dL? Yes.    Patient's BUN today is 27 4. Abnormal electrolyte  K 3.0 5. Ordered repletion with: per protocol 6. If a panic level lab has been reported, has the CCM MD in charge been notified? Yes.  .   Physician:  Bradd BurnerMannam  Elif Yonts McEachran 08/08/2016 6:14 AM

## 2016-08-08 NOTE — Progress Notes (Addendum)
Patient self extubated, this nurse not present during extubation, however, MD present and aware. Currently on RA with saturations of 95%. Lung sounds bilaterally diminished with mild congestion. Family present at bedside, and updated by MD. Will continue to monitor patient.  Trudee KusterBrandi R Mansfield

## 2016-08-08 NOTE — Progress Notes (Signed)
Subjective: Patient self-extubated this morning.  Has remained stable despite continued need for sedation.    Objective: Current vital signs: BP (!) 142/88   Pulse 86   Temp (!) 100.7 F (38.2 C) (Oral)   Resp (!) 33   Ht 5\' 5"  (1.651 m)   Wt 106.5 kg (234 lb 12.6 oz)   LMP  (LMP Unknown) Comment: neg preg test  SpO2 97%   BMI 39.07 kg/m  Vital signs in last 24 hours: Temp:  [98.4 F (36.9 C)-100.7 F (38.2 C)] 100.7 F (38.2 C) (09/18 1200) Pulse Rate:  [65-86] 86 (09/18 1200) Resp:  [18-33] 33 (09/18 1200) BP: (111-142)/(73-99) 142/88 (09/18 1200) SpO2:  [95 %-100 %] 97 % (09/18 1200) FiO2 (%):  [24 %] 24 % (09/18 0809) Weight:  [106.5 kg (234 lb 12.6 oz)] 106.5 kg (234 lb 12.6 oz) (09/18 0500)  Intake/Output from previous day: 09/17 0701 - 09/18 0700 In: 4474.1 [I.V.:2650.8; NG/GT:1470; IV Piggyback:353.3] Out: 1885 [Urine:1885] Intake/Output this shift: Total I/O In: 928.4 [I.V.:558.4; NG/GT:220; IV Piggyback:150] Out: 1025 [Urine:1025] Nutritional status:    Neurologic Exam: Mental Status: Lethargic.  Able to follow some simple commands but unable to put up two fingers to command.  Needs constant alerting.   Cranial Nerves: II: Discs flat bilaterally III,IV, VI: Extra-ocular motions intact bilaterally V,VII: smile symmetric VIII: hearing normal bilaterally IX,X: gag reflex present XI: unable to test XII: midline tongue extension Motor: Moves all extremities weakly to command with no focal weakness noted Sensory: Responds to light touch thoughout Deep Tendon Reflexes: 2+ and symmetric throughout   Lab Results: Basic Metabolic Panel:  Recent Labs Lab 08/05/16 0736 08/06/16 0430 08/06/16 1419 08/07/16 0520 08/07/16 1658 08/08/16 0445  NA 139 140  --  139  --  142  K 5.5* 3.8  --  3.2*  --  3.0*  CL 106 109  --  110  --  110  CO2 21* 25  --  24  --  27  GLUCOSE 78 105*  --  138*  --  137*  BUN 17 22*  --  34*  --  27*  CREATININE 1.82* 2.41*   --  2.14*  --  1.56*  CALCIUM 7.2* 6.9*  --  7.4*  --  7.7*  MG  --   --  2.0 2.2 2.2 2.0  PHOS  --   --  2.8 2.1* 2.0* 2.3*    Liver Function Tests:  Recent Labs Lab 08/05/16 0736 08/06/16 0430 08/07/16 0520  AST 319* 6,862* 3,096*  ALT 395* 5,266* 4,890*  ALKPHOS 36* 32* 35*  BILITOT 0.7 1.3* 1.1  PROT 6.6 5.4* 5.3*  ALBUMIN 3.4* 2.9* 2.7*   No results for input(s): LIPASE, AMYLASE in the last 168 hours. No results for input(s): AMMONIA in the last 168 hours.  CBC:  Recent Labs Lab 08/05/16 0736 08/06/16 0430 08/07/16 0520 08/08/16 0445  WBC 12.9* 9.9 9.0 9.6  NEUTROABS 11.4*  --   --   --   HGB 9.5* 11.7* 10.9* 11.5*  HCT 29.8* 33.3* 31.4* 33.2*  MCV 95.9 91.2 91.6 91.5  PLT 141* 94* 76* 96*    Cardiac Enzymes:  Recent Labs Lab 08/05/16 0736  TROPONINI 0.83*    Lipid Panel:  Recent Labs Lab 08/05/16 0736  TRIG 101    CBG:  Recent Labs Lab 08/07/16 2020 08/07/16 2353 08/08/16 0421 08/08/16 0718 08/08/16 1217  GLUCAP 133* 124* 143* 134* 124*    Microbiology: Results for orders  placed or performed during the hospital encounter of 08/05/16  Blood culture (routine x 2)     Status: None (Preliminary result)   Collection Time: 08/05/16  9:12 AM  Result Value Ref Range Status   Specimen Description BLOOD CENTRAL LINE  Final   Special Requests BOTTLES DRAWN AEROBIC AND ANAEROBIC  4CC  Final   Culture NO GROWTH 3 DAYS  Final   Report Status PENDING  Incomplete  Blood culture (routine x 2)     Status: None (Preliminary result)   Collection Time: 08/05/16  9:12 AM  Result Value Ref Range Status   Specimen Description BLOOD RIGHT ARM  Final   Special Requests BOTTLES DRAWN AEROBIC AND ANAEROBIC  5CC  Final   Culture NO GROWTH 3 DAYS  Final   Report Status PENDING  Incomplete  MRSA PCR Screening     Status: Abnormal   Collection Time: 08/05/16 12:25 PM  Result Value Ref Range Status   MRSA by PCR POSITIVE (A) NEGATIVE Final    Comment:         The GeneXpert MRSA Assay (FDA approved for NASAL specimens only), is one component of a comprehensive MRSA colonization surveillance program. It is not intended to diagnose MRSA infection nor to guide or monitor treatment for MRSA infections. RESULT CALLED TO, READ BACK BY AND VERIFIED WITH: RACHEL VERDI 08/05/16 1348 SGD   Culture, expectorated sputum-assessment     Status: None   Collection Time: 08/05/16  1:32 PM  Result Value Ref Range Status   Specimen Description EXPECTORATED SPUTUM  Final   Special Requests NONE  Final   Sputum evaluation THIS SPECIMEN IS ACCEPTABLE FOR SPUTUM CULTURE  Final   Report Status 08/05/2016 FINAL  Final  Culture, respiratory (NON-Expectorated)     Status: None (Preliminary result)   Collection Time: 08/05/16  1:32 PM  Result Value Ref Range Status   Specimen Description EXPECTORATED SPUTUM  Final   Special Requests NONE Reflexed from [id number]  Final   Gram Stain   Final    ABUNDANT WBC PRESENT, PREDOMINANTLY PMN ABUNDANT GRAM POSITIVE COCCI IN CLUSTERS MODERATE GRAM POSITIVE COCCI IN PAIRS AND CHAINS FEW GRAM POSITIVE RODS FEW GRAM NEGATIVE COCCOBACILLI Performed at [facility]    Culture FEW STAPHYLOCOCCUS AUREUS  Final   Report Status PENDING  Incomplete  Culture, expectorated sputum-assessment     Status: None   Collection Time: 08/08/16 11:00 AM  Result Value Ref Range Status   Specimen Description EXPECTORATED SPUTUM  Final   Special Requests Normal  Final   Sputum evaluation THIS SPECIMEN IS ACCEPTABLE FOR SPUTUM CULTURE  Final   Report Status 08/08/2016 FINAL  Final    Coagulation Studies: No results for input(s): LABPROT, INR in the last 72 hours.  Imaging: Dg Chest Port 1 View  Result Date: 08/07/2016 CLINICAL DATA:  Respiratory failure. EXAM: PORTABLE CHEST 1 VIEW COMPARISON:  08/06/2016. FINDINGS: Cardiomegaly. Unchanged endotracheal tube, and central venous catheter. Low lung volumes with mild vascular congestion.  No infiltrates or failure. IMPRESSION: Stable cardiomegaly, support apparatus and mild vascular congestion. Electronically Signed   By: Elsie Stain M.D.   On: 08/07/2016 08:51    Medications:  I have reviewed the patient's current medications. Scheduled: . ampicillin-sulbactam (UNASYN) IV  3 g Intravenous Q6H  . enoxaparin (LOVENOX) injection  40 mg Subcutaneous Q24H  . famotidine (PEPCID) IV  20 mg Intravenous Q12H  . mouth rinse  15 mL Mouth Rinse BID  . mupirocin ointment  1  application Nasal BID  . polyethylene glycol  17 g Oral Daily  . potassium chloride  30 mEq Per Tube Q4H  . sodium chloride flush  3 mL Intravenous Q12H    Assessment/Plan: 35 year old female with b/l globus pallidus injury.  Now extubated and following simple commands.  Spoke with family at length concerning prognosis.  Currently remains on Precedex.  Recommendations: 1.  Therapy services to evaluate with further recommendations to be made at that time.     LOS: 3 days   Thana FarrLeslie Dodge Ator, MD Neurology [phone] 08/08/2016  2:18 PM

## 2016-08-08 NOTE — Progress Notes (Signed)
Attempted swallow evaluation, patient able to swallow water without coughing, however she did clear throat. Patient restricted to only ice chips at this time, ST consult placed by Dr. Dema SeverinMungal. Trudee KusterBrandi R Mansfield

## 2016-08-08 NOTE — Progress Notes (Signed)
Pharmacy Antibiotic Note  Martha GravelJessica Z Lawson is a 35 y.o. female admitted on 08/05/2016 with possible tracheitis.  Pharmacy has been consulted for Unasyn dosing.  Plan: Unasyn 3 g iv q 6 hours.   Height: 5\' 5"  (165.1 cm) Weight: 234 lb 12.6 oz (106.5 kg) IBW/kg (Calculated) : 57  Temp (24hrs), Avg:99.5 F (37.5 C), Min:98.4 F (36.9 C), Max:100.7 F (38.2 C)   Recent Labs Lab 08/05/16 0736 08/05/16 1039 08/06/16 0430 08/07/16 0520 08/08/16 0445  WBC 12.9*  --  9.9 9.0 9.6  CREATININE 1.82*  --  2.41* 2.14* 1.56*  LATICACIDVEN 3.6* 5.4* 1.2  --   --     Estimated Creatinine Clearance: 62.2 mL/min (by C-G formula based on SCr of 1.56 mg/dL (H)).    Allergies  Allergen Reactions  . Aspirin     Antimicrobials this admission: Unasyn 9/18 >>   Dose adjustments this admission:   Microbiology results: 9/15 BCx: NGTD x 2 9/18 SCx: pending 9/15 Sputum: pending  9/15 MRSA PCR: positive  Thank you for allowing pharmacy to be a part of this patient's care.  [name], [name] 08/08/2016 4:08 PM

## 2016-08-08 NOTE — Progress Notes (Signed)
PARENTERAL NUTRITION CONSULT NOTE - INITIAL  Pharmacy Consult for Electrolyte Monitoring Indication: hypokalemia  Allergies  Allergen Reactions  . Aspirin     Patient Measurements: Height: 5\' 5"  (165.1 cm) Weight: 234 lb 12.6 oz (106.5 kg) IBW/kg (Calculated) : 57  Vital Signs: Temp: 100.7 F (38.2 C) (09/18 1200) Temp Source: Oral (09/18 1200) BP: 142/88 (09/18 1200) Pulse Rate: 86 (09/18 1200) Intake/Output from previous day: 09/17 0701 - 09/18 0700 In: 4474.1 [I.V.:2650.8; NG/GT:1470; IV Piggyback:353.3] Out: 1885 [Urine:1885] Intake/Output from this shift: Total I/O In: 1229.3 [I.V.:858.4; NG/GT:220.9; IV Piggyback:150] Out: 1025 [Urine:1025]  Labs:  Recent Labs  08/06/16 0430 08/07/16 0520 08/08/16 0445  WBC 9.9 9.0 9.6  HGB 11.7* 10.9* 11.5*  HCT 33.3* 31.4* 33.2*  PLT 94* 76* 96*     Recent Labs  08/06/16 0430  08/07/16 0520 08/07/16 1658 08/08/16 0445  NA 140  --  139  --  142  K 3.8  --  3.2*  --  3.0*  CL 109  --  110  --  110  CO2 25  --  24  --  27  GLUCOSE 105*  --  138*  --  137*  BUN 22*  --  34*  --  27*  CREATININE 2.41*  --  2.14*  --  1.56*  CALCIUM 6.9*  --  7.4*  --  7.7*  MG  --   < > 2.2 2.2 2.0  PHOS  --   < > 2.1* 2.0* 2.3*  PROT 5.4*  --  5.3*  --   --   ALBUMIN 2.9*  --  2.7*  --   --   AST 6,862*  --  3,096*  --   --   ALT 5,266*  --  4,890*  --   --   ALKPHOS 32*  --  35*  --   --   BILITOT 1.3*  --  1.1  --   --   < > = values in this interval not displayed. Estimated Creatinine Clearance: 62.2 mL/min (by C-G formula based on SCr of 1.56 mg/dL (H)).    Recent Labs  08/08/16 0421 08/08/16 0718 08/08/16 1217  GLUCAP 143* 134* 124*    Medical History: Past Medical History:  Diagnosis Date  . Asthma     Medications:  No prescriptions prior to admission.   Scheduled:  . ampicillin-sulbactam (UNASYN) IV  3 g Intravenous Q6H  . enoxaparin (LOVENOX) injection  40 mg Subcutaneous Q24H  . famotidine (PEPCID)  IV  20 mg Intravenous Q12H  . mouth rinse  15 mL Mouth Rinse BID  . mupirocin ointment  1 application Nasal BID  . polyethylene glycol  17 g Oral Daily  . potassium chloride  10 mEq Intravenous Q1 Hr x 3  . sodium chloride flush  3 mL Intravenous Q12H    Assessment: Pharmacy consulted to assist in managing electrolytes in this 35 y/o F with b/l globus pallidus injury.   Plan:  Potassium replaced with 30 meq po and 30 meq IV. Will f/u AM labs.   Valentina GuChristy, Kati Riggenbach D 08/08/2016,4:04 PM

## 2016-08-09 DIAGNOSIS — R451 Restlessness and agitation: Secondary | ICD-10-CM

## 2016-08-09 LAB — CULTURE, RESPIRATORY

## 2016-08-09 LAB — GLUCOSE, CAPILLARY
GLUCOSE-CAPILLARY: 121 mg/dL — AB (ref 65–99)
GLUCOSE-CAPILLARY: 89 mg/dL (ref 65–99)
GLUCOSE-CAPILLARY: 94 mg/dL (ref 65–99)
Glucose-Capillary: 111 mg/dL — ABNORMAL HIGH (ref 65–99)
Glucose-Capillary: 106 mg/dL — ABNORMAL HIGH (ref 65–99)
Glucose-Capillary: 109 mg/dL — ABNORMAL HIGH (ref 65–99)

## 2016-08-09 LAB — HEPATIC FUNCTION PANEL
ALT: 2183 U/L — AB (ref 14–54)
AST: 340 U/L — AB (ref 15–41)
Albumin: 2.8 g/dL — ABNORMAL LOW (ref 3.5–5.0)
Alkaline Phosphatase: 38 U/L (ref 38–126)
BILIRUBIN DIRECT: 0.7 mg/dL — AB (ref 0.1–0.5)
Indirect Bilirubin: 1.3 mg/dL — ABNORMAL HIGH (ref 0.3–0.9)
TOTAL PROTEIN: 5.9 g/dL — AB (ref 6.5–8.1)
Total Bilirubin: 2 mg/dL — ABNORMAL HIGH (ref 0.3–1.2)

## 2016-08-09 LAB — CULTURE, RESPIRATORY W GRAM STAIN

## 2016-08-09 LAB — BASIC METABOLIC PANEL
ANION GAP: 8 (ref 5–15)
BUN: 21 mg/dL — AB (ref 6–20)
CHLORIDE: 109 mmol/L (ref 101–111)
CO2: 26 mmol/L (ref 22–32)
Calcium: 7.7 mg/dL — ABNORMAL LOW (ref 8.9–10.3)
Creatinine, Ser: 1.18 mg/dL — ABNORMAL HIGH (ref 0.44–1.00)
GFR, EST NON AFRICAN AMERICAN: 60 mL/min — AB (ref >60)
Glucose, Bld: 100 mg/dL — ABNORMAL HIGH (ref 65–99)
POTASSIUM: 3.1 mmol/L — AB (ref 3.5–5.1)
SODIUM: 143 mmol/L (ref 135–145)

## 2016-08-09 LAB — POTASSIUM: Potassium: 3.3 mmol/L — ABNORMAL LOW (ref 3.5–5.1)

## 2016-08-09 LAB — MAGNESIUM: Magnesium: 1.5 mg/dL — ABNORMAL LOW (ref 1.7–2.4)

## 2016-08-09 LAB — PHOSPHORUS: PHOSPHORUS: 3.2 mg/dL (ref 2.5–4.6)

## 2016-08-09 MED ORDER — DOCUSATE SODIUM 50 MG/5ML PO LIQD
100.0000 mg | Freq: Two times a day (BID) | ORAL | Status: DC
  Administered 2016-08-09 – 2016-08-10 (×3): 100 mg via ORAL
  Filled 2016-08-09 (×3): qty 10

## 2016-08-09 MED ORDER — SENNOSIDES 8.8 MG/5ML PO SYRP
5.0000 mL | ORAL_SOLUTION | Freq: Two times a day (BID) | ORAL | Status: DC
  Administered 2016-08-09 – 2016-08-10 (×3): 5 mL via ORAL
  Filled 2016-08-09 (×3): qty 5

## 2016-08-09 MED ORDER — VANCOMYCIN HCL 10 G IV SOLR
1500.0000 mg | Freq: Once | INTRAVENOUS | Status: DC
  Filled 2016-08-09: qty 1500

## 2016-08-09 MED ORDER — POTASSIUM CHLORIDE 10 MEQ/100ML IV SOLN
10.0000 meq | INTRAVENOUS | Status: AC
  Administered 2016-08-09 – 2016-08-10 (×2): 10 meq via INTRAVENOUS
  Filled 2016-08-09 (×4): qty 100

## 2016-08-09 MED ORDER — VANCOMYCIN HCL 10 G IV SOLR
1750.0000 mg | Freq: Once | INTRAVENOUS | Status: AC
  Administered 2016-08-09: 1750 mg via INTRAVENOUS
  Filled 2016-08-09: qty 1750

## 2016-08-09 MED ORDER — POTASSIUM CHLORIDE 10 MEQ/100ML IV SOLN
10.0000 meq | INTRAVENOUS | Status: AC
Start: 2016-08-09 — End: 2016-08-09
  Administered 2016-08-09 (×4): 10 meq via INTRAVENOUS
  Filled 2016-08-09 (×4): qty 100

## 2016-08-09 MED ORDER — MAGNESIUM SULFATE 2 GM/50ML IV SOLN
2.0000 g | Freq: Once | INTRAVENOUS | Status: AC
  Administered 2016-08-09: 2 g via INTRAVENOUS
  Filled 2016-08-09: qty 50

## 2016-08-09 MED ORDER — SODIUM CHLORIDE 0.9 % IV SOLN
1500.0000 mg | Freq: Two times a day (BID) | INTRAVENOUS | Status: DC
  Administered 2016-08-10 – 2016-08-11 (×3): 1500 mg via INTRAVENOUS
  Filled 2016-08-09 (×5): qty 1500

## 2016-08-09 NOTE — Progress Notes (Signed)
Pt came with drug overdose, intubated, self extubated- have some mental changes and MRI findings- Neurology on case. Pt is stable from ICU point, SLP and PT eval ordered. Dr.Mungal transferred to hospitalist service to be seen from tomorrow morning.
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
## 2016-08-09 NOTE — Evaluation (Signed)
Clinical/Bedside Swallow Evaluation Patient Details  Name: Martha Lawson MRN: [id number] Date of Birth: 08/08/1981  Today's Date: 08/09/2016 Time: SLP Start Time (ACUTE ONLY): 1058 SLP Stop Time (ACUTE ONLY): 1158 SLP Time Calculation (min) (ACUTE ONLY): 60 min  Past Medical History:  Past Medical History:  Diagnosis Date  . Asthma    Past Surgical History: History reviewed. No pertinent surgical history. HPI:  Pt is a 35 yo white female admitted to ICU for acute encephalopathy from cocaine toxicity with neurogenic/cardiogenic shock with severe metabolic acidosis fings concerning for seizures and probable anoxic brain injury.   Assessment / Plan / Recommendation Clinical Impression  Pt appeared to tolerate trials of ice chips, thin liquids via cup, and puree trials w/ no immediate, overt s/s of aspiration; no coughing occurred during/post trials. However, pt did exhibit a delayed, mild throat clear x2 w/ trials of thin liquids via Cup. No other changes in O2 sats or HR/RR or in respiratory effort were noted w/ po trials. Oral phase of swallowing appeared min slower overall during oral motor movements during po tasks/intake but grossly WFL w/ bolus management and clearing of trials. Pt required feeding assist today d/t generalized weakness post extubation. Pt also required min verbal cues to attend during po tasks as she was drowsy. Pt appears at mild increased risk for aspiration currently d/t medical and Cognitive status' secondary to admission dx. Recommend a modified diet for today w/ trials to upgrade diet consistency tomorrow after pt has had the time to return to an oral diet. Recommend strict aspiration precautions; meds in Puree or in liquid form; feeding assistance and monitoring during any po intake. MD/NSG updated on poc and agreed; family/pt informed and agreed.    Aspiration Risk  Mild aspiration risk (reduced following aspiration precautions; diet recs.)    Diet Recommendation   Dysphagia 1(puree) w/ thin liquids via Cup; strict aspiration precautions; feeding assistance w/ meals, monitoring.  Medication Administration: Whole meds with puree (or in a liquid form mixed in puree as needed)    Other  Recommendations Recommended Consults:  (Dietician f/u) Oral Care Recommendations: Oral care BID;Staff/trained caregiver to provide oral care   Follow up Recommendations  (TBD)      Frequency and Duration min 3x week  2 weeks       Prognosis Prognosis for Safe Diet Advancement: Good Barriers to Reach Goals: Cognitive deficits (?)      Swallow Study   General Date of Onset: 08/05/16 HPI: Pt is a 35 yo white female admitted to ICU for acute encephalopathy from cocaine toxicity with neurogenic/cardiogenic shock with severe metabolic acidosis fings concerning for seizures and probable anoxic brain injury. Type of Study: Bedside Swallow Evaluation Previous Swallow Assessment: none Diet Prior to this Study: Regular;Thin liquids (per mother) Temperature Spikes Noted: No (wbc 9.6 today) Respiratory Status: Room air History of Recent Intubation: Yes Length of Intubations (days): 4 days Date extubated: 08/08/16 Behavior/Cognition: Alert;Cooperative;Pleasant mood;Lethargic/Drowsy;Distractible;Requires cueing (min) Oral Cavity Assessment: Within Functional Limits Oral Care Completed by SLP: Recent completion by staff Oral Cavity - Dentition: Adequate natural dentition Vision: Functional for self-feeding Self-Feeding Abilities: Needs assist;Needs set up;Total assist (overall weakness) Patient Positioning: Upright in bed (head forward) Baseline Vocal Quality: Low vocal intensity (intelligible) Volitional Cough: Strong Volitional Swallow: Able to elicit    Oral/Motor/Sensory Function Overall Oral Motor/Sensory Function: Within functional limits   Ice Chips Ice chips: Within functional limits Presentation: Spoon (fed; 3 trials)   Thin Liquid Thin Liquid:  Impaired Presentation: Cup;Self  Fed Oral Phase Impairments:  (none) Oral Phase Functional Implications:  (none) Pharyngeal  Phase Impairments: Throat Clearing - Delayed (x2) Other Comments: slow movements overall during task    Nectar Thick Nectar Thick Liquid: Not tested   Honey Thick Honey Thick Liquid: Not tested   Puree Puree: Within functional limits Presentation: Spoon (fed; 10 trials) Other Comments: slow movements overall   Solid   GO   Solid: Not tested Other Comments: too increased a consistency for today's assessment based on pt's medical status         Jerilynn Som, MS, CCC-SLP  Damiel Barthold 08/09/2016,11:59 AM

## 2016-08-09 NOTE — Progress Notes (Addendum)
Dr. Arsenio LoaderSommer notified of positive MRSA in sputum. Trudee KusterBrandi R Mansfield
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
## 2016-08-09 NOTE — Progress Notes (Signed)
Pharmacy Antibiotic Note  Martha Lawson is a 35 y.o. female admitted on 08/05/2016 with acute respiratory failure with positive MRSA sputum culture.  Pharmacy has been consulted for Vancomycin dosing.  Plan:  Patient received Vancomycin 1750 mg IV x 1 @ ~18:30 on 9/19  Vancomycin 1500 mg IV every 12 hours.  Goal trough 15-20 mcg/mL.  Height: 5\' 5"  (165.1 cm) Weight: 234 lb 12.6 oz (106.5 kg) IBW/kg (Calculated) : 57  Temp (24hrs), Avg:98.4 F (36.9 C), Min:97.8 F (36.6 C), Max:99 F (37.2 C)   Recent Labs Lab 08/05/16 0736 08/05/16 1039 08/06/16 0430 08/07/16 0520 08/08/16 0445 08/09/16 0411  WBC 12.9*  --  9.9 9.0 9.6  --   CREATININE 1.82*  --  2.41* 2.14* 1.56* 1.18*  LATICACIDVEN 3.6* 5.4* 1.2  --   --   --     Estimated Creatinine Clearance: 82.2 mL/min (by C-G formula based on SCr of 1.18 mg/dL (H)).    Allergies  Allergen Reactions  . Aspirin     Antimicrobials this admission: Vancomycin 9/19 >>  Unasyn  9/19 >>   Dose adjustments this admission:   Microbiology results:  BCx:   UCx:   Sputum: MRSA    MRSA PCR:   Thank you for allowing pharmacy to be a part of this patient's care.  Lorette Peterkin D 08/09/2016 8:01 PM

## 2016-08-09 NOTE — Progress Notes (Signed)
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  K still slightly below goal range. Will give KCl 10 mEQ IV x 2. Will recheck Electrolyte panel with am labs.     Allergies  Allergen Reactions  . Aspirin     Patient Measurements: Height: 5\' 5"  (165.1 cm) Weight: 234 lb 12.6 oz (106.5 kg) IBW/kg (Calculated) : 57   Vital Signs: Temp: 98.3 F (36.8 C) (09/19 1900) Temp Source: Oral (09/19 1900) BP: 135/91 (09/19 1900) Pulse Rate: 79 (09/19 1900) Intake/Output from previous day: 09/18 0701 - 09/19 0700 In: 4312.7 [I.V.:2341.7; NG/GT:220.9; IV Piggyback:600] Out: 2325 [Urine:2325] Intake/Output from this shift: No intake/output data recorded.  Labs:  Recent Labs  08/07/16 0520 08/08/16 0445  WBC 9.0 9.6  HGB 10.9* 11.5*  HCT 31.4* 33.2*  PLT 76* 96*     Recent Labs  08/07/16 0520 08/07/16 1658 08/08/16 0445 08/09/16 0411 08/09/16 1821  NA 139  --  142 143  --   K 3.2*  --  3.0* 3.1* 3.3*  CL 110  --  110 109  --   CO2 24  --  27 26  --   GLUCOSE 138*  --  137* 100*  --   BUN 34*  --  27* 21*  --   CREATININE 2.14*  --  1.56* 1.18*  --   CALCIUM 7.4*  --  7.7* 7.7*  --   MG 2.2 2.2 2.0 1.5*  --   PHOS 2.1* 2.0* 2.3* 3.2  --   PROT 5.3*  --   --  5.9*  --   ALBUMIN 2.7*  --   --  2.8*  --   AST 3,096*  --   --  340*  --   ALT 4,890*  --   --  2,183*  --   ALKPHOS 35*  --   --  38  --   BILITOT 1.1  --   --  2.0*  --   BILIDIR  --   --   --  0.7*  --   IBILI  --   --   --  1.3*  --    Estimated Creatinine Clearance: 82.2 mL/min (by C-G formula based on SCr of 1.18 mg/dL (H)).    Recent Labs  08/09/16 0802 08/09/16 1152 08/09/16 1607  GLUCAP 111* 106* 121*    Medications:  Scheduled:  . ampicillin-sulbactam (UNASYN) IV  3 g Intravenous Q6H  . sennosides  5 mL Oral BID   And  . docusate  100 mg Oral BID  . enoxaparin (LOVENOX) injection  40 mg  Subcutaneous Q24H  . famotidine (PEPCID) IV  20 mg Intravenous Q12H  . mouth rinse  15 mL Mouth Rinse BID  . mupirocin ointment  1 application Nasal BID  . polyethylene glycol  17 g Oral Daily  . potassium chloride  10 mEq Intravenous Q1 Hr x 2  . sodium chloride flush  3 mL Intravenous Q12H  . [START ON 08/10/2016] vancomycin  1,500 mg Intravenous Q12H  . vancomycin  1,750 mg Intravenous Once   Infusions:  . dexmedetomidine Stopped (08/08/16 1100)  . dextrose 5 % and 0.45% NaCl 50 mL/hr at 08/09/16 1041  . norepinephrine (LEVOPHED) Adult infusion Stopped (08/05/16 2316)     Horris LatinoHolly Gilliam, PharmD Pharmacy Resident 08/09/2016 8:08 PM

## 2016-08-09 NOTE — Progress Notes (Signed)
Pharmacy Antibiotic Note  Martha Lawson is a 35 y.o. female admitted on 08/05/2016 with possible tracheitis. Pharmacy has been consulted for Unasyn dosing.  Plan: Pt is currently on Unasyn 3 g IV q 6 hours. Respiratory culture revealed MRSA. Contacted [facility] in attempt to get a recommendation on change in therapy. Notified RN.   Height: 5\' 5"  (165.1 cm) Weight: 234 lb 12.6 oz (106.5 kg) IBW/kg (Calculated) : 57  Temp (24hrs), Avg:98.6 F (37 C), Min:97.9 F (36.6 C), Max:99 F (37.2 C)   Recent Labs Lab 08/05/16 0736 08/05/16 1039 08/06/16 0430 08/07/16 0520 08/08/16 0445 08/09/16 0411  WBC 12.9*  --  9.9 9.0 9.6  --   CREATININE 1.82*  --  2.41* 2.14* 1.56* 1.18*  LATICACIDVEN 3.6* 5.4* 1.2  --   --   --     Estimated Creatinine Clearance: 82.2 mL/min (by C-G formula based on SCr of 1.18 mg/dL (H)).    Allergies  Allergen Reactions  . Aspirin     Antimicrobials this admission: Unasyn 9/18 >>    Microbiology results: 9/15 BCx: no growth x 4 days 9/15 Sputum: MRSA 9/18 Sputum: few GPC and few GNR 9/15 MRSA PCR: positive  Thank you for allowing pharmacy to be a part of this patient's care.  Horris LatinoHolly Gilliam, PharmD Pharmacy Resident 08/09/2016 4:50 PM

## 2016-08-09 NOTE — Progress Notes (Signed)
CONSULT NOTE  Pharmacy consulted for electrolyte management in this [age] yoF admitted for acute mental status changes secondary to polydrug abuse.  1. Electrolyte management: 9/19 am K: 3.1, Mg 1.5; NP already ordered K 40mEq IV and Mg 2gm Ordered recheck K this evening, BMP and mag tomorrow with AM labs   Allergies  Allergen Reactions  . Aspirin     Patient Measurements: Height: 5\' 5"  (165.1 cm) Weight: 234 lb 12.6 oz (106.5 kg) IBW/kg (Calculated) : 57   Vital Signs: Temp: 97.9 F (36.6 C) (09/19 1136) Temp Source: Oral (09/19 1136) BP: 122/88 (09/19 1500) Pulse Rate: 72 (09/19 1400) Intake/Output from previous day: 09/18 0701 - 09/19 0700 In: 4312.7 [I.V.:2341.7; NG/GT:220.9; IV Piggyback:600] Out: 2325 [Urine:2325] Intake/Output from this shift: Total I/O In: 1083.3 [I.V.:483.3; IV Piggyback:600] Out: 715 [Urine:715]  Labs:  Recent Labs  08/07/16 0520 08/08/16 0445  WBC 9.0 9.6  HGB 10.9* 11.5*  HCT 31.4* 33.2*  PLT 76* 96*     Recent Labs  08/07/16 0520 08/07/16 1658 08/08/16 0445 08/09/16 0411  NA 139  --  142 143  K 3.2*  --  3.0* 3.1*  CL 110  --  110 109  CO2 24  --  27 26  GLUCOSE 138*  --  137* 100*  BUN 34*  --  27* 21*  CREATININE 2.14*  --  1.56* 1.18*  CALCIUM 7.4*  --  7.7* 7.7*  MG 2.2 2.2 2.0 1.5*  PHOS 2.1* 2.0* 2.3* 3.2  PROT 5.3*  --   --  5.9*  ALBUMIN 2.7*  --   --  2.8*  AST 3,096*  --   --  340*  ALT 4,890*  --   --  2,183*  ALKPHOS 35*  --   --  38  BILITOT 1.1  --   --  2.0*  BILIDIR  --   --   --  0.7*  IBILI  --   --   --  1.3*   Estimated Creatinine Clearance: 82.2 mL/min (by C-G formula based on SCr of 1.18 mg/dL (H)).    Recent Labs  08/09/16 0802 08/09/16 1152 08/09/16 1607  GLUCAP 111* 106* 121*    Medications:  Scheduled:  . ampicillin-sulbactam (UNASYN) IV  3 g Intravenous Q6H  . sennosides  5 mL Oral BID   And  . docusate  100 mg Oral BID  . enoxaparin (LOVENOX) injection  40 mg Subcutaneous  Q24H  . famotidine (PEPCID) IV  20 mg Intravenous Q12H  . mouth rinse  15 mL Mouth Rinse BID  . mupirocin ointment  1 application Nasal BID  . polyethylene glycol  17 g Oral Daily  . sodium chloride flush  3 mL Intravenous Q12H   Infusions:  . dexmedetomidine Stopped (08/08/16 1100)  . dextrose 5 % and 0.45% NaCl 50 mL/hr at 08/09/16 1041  . norepinephrine (LEVOPHED) Adult infusion Stopped (08/05/16 2316)     Martha Lawson, PharmD Pharmacy Resident 08/09/2016 4:16 PM

## 2016-08-09 NOTE — Progress Notes (Signed)
[facility] Physician-Brief Progress Note Patient Name: Martha GravelJessica Z Lawson DOB: 07-Dec-1980 MRN: [id number]   Date of Service  08/09/2016  HPI/Events of Note  Patient has sputum culture positive for MRSA. Already on contact isolation.   eICU Interventions  Will order: 1. Vancomycin per Pharmacy Consult.      Intervention Category Major Interventions: Infection - evaluation and management  Sommer,Steven Eugene 08/09/2016, 5:01 PM

## 2016-08-09 NOTE — Progress Notes (Signed)
PT Cancellation Note  Patient Details Name: Martha Lawson MRN: [id number] DOB: 1981/01/18   Cancelled Treatment:    Reason Eval/Treat Not Completed: Fatigue/lethargy limiting ability to participate (Per OT, patient with significant lethargy; unable to awaken for participation with session this PM.  Will re-attempt next date as medically appropriate.)   Jayliah Benett H. Manson PasseyBrown, PT, DPT, NCS 08/09/16, 2:33 PM [phone]

## 2016-08-09 NOTE — Progress Notes (Signed)
OT Cancellation Note  Patient Details Name: Martha GravelJessica Z Lawson MRN: [id number] DOB: [birth year]   Cancelled Treatment:    Reason Eval/Treat Not Completed: Fatigue/lethargy limiting ability to participate.  Order received and chart reviewed.  Family friend Martha Lawson in room and pt was not alerting to verbal cues, tactile stimulation with cold cloth to face or to Martha Lawson's voice.  Will attempt again tomorrow.  NSG updated.   Martha BordersSusan Lawson, OTR/L ascom [phone] 08/09/16, 2:44 PM

## 2016-08-10 DIAGNOSIS — G931 Anoxic brain damage, not elsewhere classified: Secondary | ICD-10-CM

## 2016-08-10 DIAGNOSIS — K759 Inflammatory liver disease, unspecified: Secondary | ICD-10-CM

## 2016-08-10 DIAGNOSIS — R7989 Other specified abnormal findings of blood chemistry: Secondary | ICD-10-CM

## 2016-08-10 DIAGNOSIS — E872 Acidosis, unspecified: Secondary | ICD-10-CM

## 2016-08-10 DIAGNOSIS — K7200 Acute and subacute hepatic failure without coma: Secondary | ICD-10-CM

## 2016-08-10 DIAGNOSIS — R945 Abnormal results of liver function studies: Secondary | ICD-10-CM

## 2016-08-10 LAB — GLUCOSE, CAPILLARY
GLUCOSE-CAPILLARY: 97 mg/dL (ref 65–99)
GLUCOSE-CAPILLARY: 96 mg/dL (ref 65–99)
GLUCOSE-CAPILLARY: 122 mg/dL — AB (ref 65–99)
Glucose-Capillary: 101 mg/dL — ABNORMAL HIGH (ref 65–99)
Glucose-Capillary: 130 mg/dL — ABNORMAL HIGH (ref 65–99)
Glucose-Capillary: 93 mg/dL (ref 65–99)

## 2016-08-10 LAB — BLOOD CULTURE ID PANEL (REFLEXED)
Acinetobacter baumannii: NOT DETECTED
CANDIDA KRUSEI: NOT DETECTED
CANDIDA PARAPSILOSIS: NOT DETECTED
CANDIDA TROPICALIS: NOT DETECTED
CARBAPENEM RESISTANCE: NOT DETECTED
Candida albicans: NOT DETECTED
Candida glabrata: NOT DETECTED
ENTEROBACTERIACEAE SPECIES: NOT DETECTED
Enterobacter cloacae complex: NOT DETECTED
Enterococcus species: NOT DETECTED
Escherichia coli: NOT DETECTED
Haemophilus influenzae: NOT DETECTED
KLEBSIELLA OXYTOCA: NOT DETECTED
KLEBSIELLA PNEUMONIAE: NOT DETECTED
Listeria monocytogenes: NOT DETECTED
Methicillin resistance: NOT DETECTED
Neisseria meningitidis: NOT DETECTED
PROTEUS SPECIES: NOT DETECTED
PSEUDOMONAS AERUGINOSA: NOT DETECTED
STAPHYLOCOCCUS SPECIES: NOT DETECTED
STREPTOCOCCUS AGALACTIAE: NOT DETECTED
STREPTOCOCCUS PNEUMONIAE: NOT DETECTED
Serratia marcescens: NOT DETECTED
Staphylococcus aureus (BCID): NOT DETECTED
Streptococcus pyogenes: NOT DETECTED
Streptococcus species: NOT DETECTED
Vancomycin resistance: NOT DETECTED

## 2016-08-10 LAB — BASIC METABOLIC PANEL
Anion gap: 6 (ref 5–15)
BUN: 14 mg/dL (ref 6–20)
CALCIUM: 7.7 mg/dL — AB (ref 8.9–10.3)
CO2: 28 mmol/L (ref 22–32)
CREATININE: 0.9 mg/dL (ref 0.44–1.00)
Chloride: 105 mmol/L (ref 101–111)
Glucose, Bld: 98 mg/dL (ref 65–99)
Potassium: 3.4 mmol/L — ABNORMAL LOW (ref 3.5–5.1)
Sodium: 139 mmol/L (ref 135–145)

## 2016-08-10 LAB — CULTURE, BLOOD (ROUTINE X 2): CULTURE: NO GROWTH

## 2016-08-10 LAB — MAGNESIUM: MAGNESIUM: 1.7 mg/dL (ref 1.7–2.4)

## 2016-08-10 MED ORDER — POTASSIUM CHLORIDE 10 MEQ/100ML IV SOLN
10.0000 meq | INTRAVENOUS | Status: AC
  Administered 2016-08-10 (×4): 10 meq via INTRAVENOUS
  Filled 2016-08-10 (×4): qty 100

## 2016-08-10 MED ORDER — LORAZEPAM 2 MG/ML IJ SOLN: 1.0000 mg | INTRAMUSCULAR | Status: DC | PRN

## 2016-08-10 MED ORDER — POTASSIUM CHLORIDE CRYS ER 20 MEQ PO TBCR
20.0000 meq | EXTENDED_RELEASE_TABLET | ORAL | Status: DC
  Filled 2016-08-10: qty 1

## 2016-08-10 MED ORDER — LORAZEPAM BOLUS VIA INFUSION: 1.0000 mg | INTRAVENOUS | Status: DC | PRN

## 2016-08-10 MED ORDER — MAGNESIUM SULFATE 2 GM/50ML IV SOLN
2.0000 g | Freq: Once | INTRAVENOUS | Status: AC
  Administered 2016-08-10: 2 g via INTRAVENOUS
  Filled 2016-08-10: qty 50

## 2016-08-10 NOTE — Progress Notes (Signed)
Inpatient Rehabilitation  Per RNCM, Angela's request patient was screened by Fae PippinMelissa Renie Stelmach for appropriateness for Inpatient Acute Rehab.  Patient appears to have an IP Rehab appropriate diagnosis and at this time we await PT/OT evaluations and recommendations.  Plan to follow along for recommendations prior to reaching out to patient's family.  Please call with questions.    Charlane FerrettiMelissa Nasreen Goedecke, M.A., CCC/SLP Admission Coordinator  [facility]  Cell [phone]

## 2016-08-10 NOTE — Progress Notes (Signed)
PULMONARY / CRITICAL CARE MEDICINE   Name: Martha GravelJessica Z Lawson MRN: [id number] DOB: 11-24-1980    ADMISSION DATE:  08/05/2016  Patient Profile:35 yo white female admitted to ICU for acute mental status changes -patient found unresponsive at home by husband, patient was using drugs +cocaine  Subjective: Patient had a restful night ,was afebrile, will little confusion.  VITAL SIGNS: Temp:  [97.8 F (36.6 C)-98.4 F (36.9 C)] 98.2 F (36.8 C) (09/20 0000) Pulse Rate:  [26-86] 34 (09/20 0400) Resp:  [18-32] 22 (09/20 0400) BP: (100-140)/(80-114) 133/96 (09/20 0400) SpO2:  [86 %-100 %] 86 % (09/20 0400) Weight:  [106.7 kg (235 lb 3.7 oz)] 106.7 kg (235 lb 3.7 oz) (09/20 0435) HEMODYNAMICS:   VENTILATOR SETTINGS:   INTAKE / OUTPUT:  Intake/Output Summary (Last 24 hours) at 08/10/16 0509 Last data filed at 08/10/16 0400  Gross per 24 hour  Intake          3583.33 ml  Output             1805 ml  Net          1778.33 ml    PHYSICAL EXAMINATION: PHYSICAL EXAMINATION: Physical Examination:   GENERAL:young white female, confused, no acute distress at this time. HEAD: Normocephalic, atraumatic.  EYES: Pupils equal, round, reactive to light.  No scleral icterus.  MOUTH: Moist mucosal membrane. NECK: Supple. No thyromegaly. No nodules. No JVD. PULMONARY: clear bilaterally, no wheezes, crackles, rhonchi noted. CARDIOVASCULAR: S1 and S2. Regular rate and rhythm. No murmurs, rubs, or gallops.  GASTROINTESTINAL: Soft, nontender, +distended. No masses. Positive bowel sounds. No guarding MUSCULOSKELETAL: No swelling, clubbing, or edema.  NEUROLOGIC:follows simple commands SKIN:intact,warm,dry    LABS:  CBC  Recent Labs Lab 08/06/16 0430 08/07/16 0520 08/08/16 0445  WBC 9.9 9.0 9.6  HGB 11.7* 10.9* 11.5*  HCT 33.3* 31.4* 33.2*  PLT 94* 76* 96*   Coag's No results for input(s): APTT, INR in the last 168 hours. BMET  Recent Labs Lab 08/07/16 0520 08/08/16 0445  08/09/16 0411 08/09/16 1821  NA 139 142 143  --   K 3.2* 3.0* 3.1* 3.3*  CL 110 110 109  --   CO2 24 27 26   --   BUN 34* 27* 21*  --   CREATININE 2.14* 1.56* 1.18*  --   GLUCOSE 138* 137* 100*  --    Electrolytes  Recent Labs Lab 08/07/16 0520 08/07/16 1658 08/08/16 0445 08/09/16 0411  CALCIUM 7.4*  --  7.7* 7.7*  MG 2.2 2.2 2.0 1.5*  PHOS 2.1* 2.0* 2.3* 3.2   Sepsis Markers  Recent Labs Lab 08/05/16 0736 08/05/16 1039 08/06/16 0430  LATICACIDVEN 3.6* 5.4* 1.2   ABG  Recent Labs Lab 08/06/16 0500 08/06/16 1125 08/07/16 0454  PHART 7.38 7.39 7.42  PCO2ART 41 41 39  PO2ART 130* 105 98   Liver Enzymes  Recent Labs Lab 08/06/16 0430 08/07/16 0520 08/09/16 0411  AST 6,862* 3,096* 340*  ALT 5,266* 4,890* 2,183*  ALKPHOS 32* 35* 38  BILITOT 1.3* 1.1 2.0*  ALBUMIN 2.9* 2.7* 2.8*   Cardiac Enzymes  Recent Labs Lab 08/05/16 0736  TROPONINI 0.83*   Glucose  Recent Labs Lab 08/09/16 0412 08/09/16 0802 08/09/16 1152 08/09/16 1607 08/09/16 2117 08/10/16 0437  GLUCAP 94 111* 106* 121* 109* 97    Imaging No results found. 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 See discussion above. Smaller focus of acute infarction affects  the LEFT corpus callosum.  ASSESSMENT / PLAN:  35 yo white female admitted to ICU for acute encephalopathy from cocaine toxicity with neurogenic/cardiogenic shock with severe metabolic acidosis fings concerning for  probable anoxic brain injury, with elevated liver enzymes   PULMONARY -Respiratory Failure- Self extubated 9/18 - on room air , sating well - continue Bronchodilator Therapy - Thick foul-smelling purulent secretions, this was noted before self extubation, will empirically start Unasyn possible tracheitis - follow cultures  CARDIOVASCULAR Shock-neurogenic/cardiogenic - resolved   GASTROINTESTINAL Shocked Liver  - possible due to unresponsive and downtime - dysphagia diet with thin liquids - liver enzymes elevated but trending down rapidly. -Follow LFT's - trending down rapidly -Hep profile negative  HEMATOLOGIC follow CBC intermittently  INFECTIOUS ? Tracheitis (treat for 5 days total) - Unasyn 9/18>9/23 - resp cx >  ENDOCRINE - ICU hypoglycemic\Hyperglycemia protocol  NEUROLOGIC Possible anoxic injury - per MRI, GP injury bilaterally -Follow up neurology recommendation,  there is some GP injury, clinical outcome depending on the extent of damage and recovery process.  -Improving neurological status slowly, with some confusion.  Dr. Dema Severin spoke with the Hospitalist service(Dr. Elisabeth Pigeon) , transferred to the Hospitalist service on 9/20.    Bincy Varughese,AG-ACNP Pulmonary & Critical Care  STAFF NOTE: I, Dr. Stephanie Acre have personally reviewed patient's available data, including medical history, events of note, physical examination and test results as part of my evaluation. I have discussed with NP Varughese  and other care providers such as pharmacist, RN and RRT.    35yo s/p respiratory arrest secondary to drug OD, cocaine, with mild brain anoxic injury (Globus Pallidus), now extubated, but with altered mental status and anoxic encephalopathy  Encephalopathy - due to anoxic brain injury AMS Agitation  P: - cont with OT/PT eval, when able to - follow ST recs - follow neuro recs - patient will have a new baseline mentation, pending her neurologic recovery, this is yet to be determine, but neurology is following along.   [name]  Rest per NP/medical resident whose note is outlined above and that I agree with  Thank you for consulting [facility], we will signoff at this time.  Please feel free to contact us with any questions at [phone] (please enter 7-digits).  Critical Care Time devoted to patient care services described in this note is  35 Minutes.    This time reflects time of care of this signee Dr Stephanie Acre.  This critical care time does not reflect procedure time, or teaching time or supervisory time of PA/NP/Med-student/Med Resident etc but could involve care discussion time.  Stephanie Acre, MD [place] [facility] Pager [phone] (please enter 7-digits) On Call Pager - [phone] (please enter 7-digits)  Note: This note was prepared with Dragon dictation along with smaller phrase technology. Any transcriptional errors that result from this process are unintentional.

## 2016-08-10 NOTE — Progress Notes (Signed)
CONSULT NOTE  Pharmacy consulted for electrolyte management and vancomycin dosing in this 89 yoF admitted for acute mental status changes secondary to polydrug abuse.  1. Repalce K with 4 runs IV and f/u AM labs. Magnesium replaced with 2 g iv x 1. F/U AM labs.   2. Continue vancomycin 1500 mg iv q 12 hours and f/u trough.     Allergies  Allergen Reactions  . Aspirin     Patient Measurements: Height: 5\' 5"  (165.1 cm) Weight: 235 lb 3.7 oz (106.7 kg) IBW/kg (Calculated) : 57   Vital Signs: Temp: 98.1 F (36.7 C) (09/20 0800) Temp Source: Axillary (09/20 0800) BP: 136/97 (09/20 0900) Pulse Rate: 49 (09/20 0900) Intake/Output from previous day: 09/19 0701 - 09/20 0700 In: 3383.3 [I.V.:1433.3; IV Piggyback:1950] Out: 2835 [Urine:2835] Intake/Output from this shift: Total I/O In: 9 [I.V.:9] Out: -   Labs:  Recent Labs  08/08/16 0445  WBC 9.6  HGB 11.5*  HCT 33.2*  PLT 96*     Recent Labs  08/07/16 1658  08/08/16 0445 08/09/16 0411 08/09/16 1821 08/10/16 0505  NA  --   --  142 143  --  139  K  --   < > 3.0* 3.1* 3.3* 3.4*  CL  --   --  110 109  --  105  CO2  --   --  27 26  --  28  GLUCOSE  --   --  137* 100*  --  98  BUN  --   --  27* 21*  --  14  CREATININE  --   --  1.56* 1.18*  --  0.90  CALCIUM  --   --  7.7* 7.7*  --  7.7*  MG 2.2  --  2.0 1.5*  --  1.7  PHOS 2.0*  --  2.3* 3.2  --   --   PROT  --   --   --  5.9*  --   --   ALBUMIN  --   --   --  2.8*  --   --   AST  --   --   --  340*  --   --   ALT  --   --   --  2,183*  --   --   ALKPHOS  --   --   --  38  --   --   BILITOT  --   --   --  2.0*  --   --   BILIDIR  --   --   --  0.7*  --   --   IBILI  --   --   --  1.3*  --   --   < > = values in this interval not displayed. Estimated Creatinine Clearance: 107.9 mL/min (by C-G formula based on SCr of 0.9 mg/dL).    Recent Labs  08/10/16 0437 08/10/16 0726 08/10/16 1148  GLUCAP 97 96 93    Medications:  Scheduled:  . sennosides  5  mL Oral BID   And  . docusate  100 mg Oral BID  . enoxaparin (LOVENOX) injection  40 mg Subcutaneous Q24H  . famotidine (PEPCID) IV  20 mg Intravenous Q12H  . mouth rinse  15 mL Mouth Rinse BID  . mupirocin ointment  1 application Nasal BID  . polyethylene glycol  17 g Oral Daily  . sodium chloride flush  3 mL Intravenous Q12H  . vancomycin  1,500 mg Intravenous Q12H  Infusions:  . dextrose 5 % and 0.45% NaCl 50 mL/hr at 08/10/16 0700  . norepinephrine (LEVOPHED) Adult infusion Stopped (08/05/16 2316)     Luisa Hart, PharmD Clinical Pharmacist  08/10/2016 3:45 PM

## 2016-08-10 NOTE — Care Management (Signed)
Patient is self-pay. She apparently has a brain injury but at this time we do not know the extent. She has not been able to work with PT/OT yet due to lethargy. I have asked [facility](Melissa [phone]) to follow this case as she is self-pay.  Melissa agreed to follow pending PT/OT determination of discharge need. RNCM will continue to follow.

## 2016-08-10 NOTE — Progress Notes (Signed)
SLP Cancellation Note  Patient Details Name: Martha GravelJessica Z Lawson MRN: [id number] DOB: [birth year]   Cancelled treatment:       Reason Eval/Treat Not Completed: Fatigue/lethargy limiting ability to participate;Medical issues which prohibited therapy (NSG reported similar presentation all morning.)  Due to pt's lethargic presentation, no upgrade of diet consistency is recommended today d/t increased risk for aspiration. ST services will f/u w/ pt's status tomorrow. NSG agreed.    Jerilynn SomKatherine Geno Sydnor, MS, CCC-SLP  Ioana Louks 08/10/2016, 12:18 PM

## 2016-08-10 NOTE — Progress Notes (Signed)
OT Cancellation Note  Patient Details Name: Martha GravelJessica Z Lawson MRN: [id number] DOB: Jan 29, 1981   Cancelled Treatment:    Reason Eval/Treat Not Completed: Fatigue/lethargy limiting ability to participate.  Talked to patient's mother and husband who were in the room with patient and she continues to be lethargic and not able to participate in OT evaluation.  Will attempt again later today or tomorrow.   Susanne BordersSusan Wofford, OTR/L ascom [phone] 08/10/16, 10:59 AM

## 2016-08-10 NOTE — Progress Notes (Signed)
[employer] [facility] [facility] [place] [facility]   PATIENT NAME: Martha HammedJessica Lawson    MR#:  [id number]  DATE OF BIRTH:  1981/08/23  SUBJECTIVE:  CHIEF COMPLAINT:   Chief Complaint  Patient presents with  . Altered Mental Status   Brought by EMS- unresponsive, drug overdose, intubated and self extubated. Also having MRSA in sputum cx. Since extubation, oxygenation is stable, but mental status Is not back to baseline, and suspected to have anoxic brain injuries. Pt is drowsy, and not having much communication.  REVIEW OF SYSTEMS:   Because of drowsiness, pt is not able to give further details. ROS  DRUG ALLERGIES:   Allergies  Allergen Reactions  . Aspirin     VITALS:  Blood pressure 132/87, pulse 73, temperature 98.3 F (36.8 C), temperature source Oral, resp. rate 20, height 5\' 5"  (1.651 m), weight 106.7 kg (235 lb 3.7 oz), SpO2 100 %.  PHYSICAL EXAMINATION:  GENERAL:  35 y.o.-year-old patient lying in the bed with no acute distress.  EYES: Pupils equal, round, reactive to light . No scleral icterus. Extraocular muscles intact.  HEENT: Head atraumatic, normocephalic. Oropharynx and nasopharynx clear.  NECK:  Supple, no jugular venous distention. No thyroid enlargement, no tenderness.  LUNGS: Normal breath sounds bilaterally, no wheezing, rales,rhonchi or crepitation. No use of accessory muscles of respiration.  CARDIOVASCULAR: S1, S2 normal. No murmurs, rubs, or gallops.  ABDOMEN: Soft, nontender, nondistended. Bowel sounds present. No organomegaly or mass.  EXTREMITIES: No pedal edema, cyanosis, or clubbing.  NEUROLOGIC: pt is drowsy, response to name calling, but keeps eyes closed and moves all 4 limbs very little ( power 1-2/5 or limited in following the command due to mental status) PSYCHIATRIC: The patient is drowsy.  SKIN: No obvious rash, lesion, or ulcer.   Physical Exam LABORATORY PANEL:   CBC  Recent Labs Lab 08/08/16 0445  WBC 9.6  HGB 11.5*  HCT 33.2*   PLT 96*   ------------------------------------------------------------------------------------------------------------------  Chemistries   Recent Labs Lab 08/09/16 0411  08/10/16 0505  NA 143  --  139  K 3.1*  < > 3.4*  CL 109  --  105  CO2 26  --  28  GLUCOSE 100*  --  98  BUN 21*  --  14  CREATININE 1.18*  --  0.90  CALCIUM 7.7*  --  7.7*  MG 1.5*  --  1.7  AST 340*  --   --   ALT 2,183*  --   --   ALKPHOS 38  --   --   BILITOT 2.0*  --   --   < > = values in this interval not displayed. ------------------------------------------------------------------------------------------------------------------  Cardiac Enzymes  Recent Labs Lab 08/05/16 0736  TROPONINI 0.83*   ------------------------------------------------------------------------------------------------------------------  RADIOLOGY:  No results found.  ASSESSMENT AND PLAN:   Active Problems:   Acute respiratory failure (HCC)   Unresponsive   Metabolic acidosis   Anoxic brain damage (HCC)   Elevated LFTs   Shock liver  * altered mental status   Likely anoxic brain injury   Neurology on case.   Monitor for now, may have new baseline.  * drug overdose    I will call psych once more alert.  * elevated LFT and shock liver   Resolving now.  * MRSA tracheitis    vanc IV.      All the records are reviewed and case discussed with Care Management/Social Workerr. Management plans discussed with the patient, family and they are  in agreement.  CODE STATUS: full  TOTAL TIME TAKING CARE OF THIS PATIENT: 35 minutes.  Discussed with pt's husband and mother in room.   POSSIBLE D/C IN 2-3 DAYS, DEPENDING ON CLINICAL CONDITION.   Altamese Dilling M.D on 08/10/2016   Between 7am to 6pm - Pager - [phone]  After 6pm go to [email or web address] - Social research officer, government  [facility]  Office  [phone]  CC: Primary care physician; No PCP Per Patient  Note: This dictation was  prepared with Dragon dictation along with smaller phrase technology. Any transcriptional errors that result from this process are unintentional.

## 2016-08-10 NOTE — Evaluation (Signed)
Occupational Therapy Evaluation Patient Details Name: Martha Lawson MRN: [id number] DOB: 09/01/1981 Today's Date: 08/10/2016    History of Present Illness Pt. found unresponsive at home by her husband and admittted for acute respiratory failue +cocaine, intubated 08/05/16 and self extubated 08/08/16.  Pt with acute encephalopathy from cocaine toxicity with neurogenic/cardiogenic shock with severe metabolic acidosis fings concerning for  probable anoxic brain injury, with elevated liver enzymes.  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   Clinical Impression   Pt is 35 year old female who was found unresponsive by her husband at home and was + for cocaine use.  She was intubated 08-05-16 and self extubated on 08-08-16.  She sustained acute encephalopathy from cocaine toxicity with neurogenic/cardiogenic shock with severe metabolic acidosis.  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  She was sitting up in recliner but reclined and having difficulty with sitting upright due to muscle weakness and fatigue.  Her mother was sitting bedside and reported she was able to use spoon without foam utensil holder today which is good progress for functional hand movement and hand to mouth pattern. Pt oriented to person, place and thought it was Sept 19th vs 20th, correct year but could not name the President without cues.  She stated she has 2 children, ages 48 and 63, boy and girl and recalled their names.  She presents with intact sensation of BUE and hands to light touch and hot/cold.  No pronator drift noted.  Finger to nose was difficult to assess due to lethargy as well as full vision test which will be assessed another time.  She requires min assist for feeding and grooming skills with extra time for fatigue and sequencing, mod assist for UB dressing and max to total assist for LB dressing.  Pt would benefit from skilled OT services to address ADL training, fine motor skills training,  adaptive equipment training, strengthening, and family ed and training.  Pt would benefit from CIR  for continued rehab after discharge from hospital to be able to return to PLOF and care for 2 children.    Follow Up Recommendations  CIR    Equipment Recommendations       Recommendations for Other Services       Precautions / Restrictions Precautions Precautions: Fall Restrictions Weight Bearing Restrictions: No      Mobility Bed Mobility Overal bed mobility: + 2 for safety/equipment Bed Mobility: Supine to Sit     Supine to sit: +2 for safety/equipment     General bed mobility comments: Pt. able to initiate movement of LE's towards EOB, required assistance to move LE's off of bed and to position at EOB. verbal cues for hand placement to assist with B UE.   Transfers Overall transfer level: Needs assistance Equipment used: Rolling walker (2 wheeled) Transfers: Sit to/from Stand Sit to Stand: +2 physical assistance         General transfer comment: Pt. able to stand to RW with one UE pushing from EOB.     Balance Overall balance assessment: Needs assistance Sitting-balance support: Feet supported;Bilateral upper extremity supported Sitting balance-Leahy Scale: Fair Sitting balance - Comments: Pt. initially unable to Sit EOB without mod A. for trunk support. Once placed with feet supported, B UE supported, center of gravity within base of support pt. was able to maintain sitting balance for short periods of time. pt. drifts Right posteriorly but able to recover sitting balance anteriorly with verbal cues.   Standing balance  support: Bilateral upper extremity supported Standing balance-Leahy Scale: Fair                              ADL Overall ADL's : Needs assistance/impaired Eating/Feeding: Set up;Minimal assistance;Bed level   Grooming: Wash/dry hands;Wash/dry face;Oral care;Applying deodorant;Brushing hair;Minimal assistance;Set up;Cueing for  sequencing Grooming Details (indicate cue type and reason): delayed responses but able to follow steps to complete automatic tasks; limited due to lethargy and first time sitting up in chair         Upper Body Dressing : Set up;Minimal assistance Upper Body Dressing Details (indicate cue type and reason): extra time needed Lower Body Dressing: Maximal assistance Lower Body Dressing Details (indicate cue type and reason): pt fatigued sitting up in recliner and not able to sit forward on her own to reach feet and not able to keep eyes open when trying to lean foward                General ADL Comments: today was first time patient was alert enough to transfer from bed to recliner with PT and then work with OT.     Vision Additional Comments: pt too lethargic from sitting up in recliner to follow steps to assess vision but rec full assessment when more alert   Perception     Praxis      Pertinent Vitals/Pain Pain Assessment: No/denies pain     Hand Dominance Right   Extremity/Trunk Assessment Upper Extremity Assessment Upper Extremity Assessment: Generalized weakness   Lower Extremity Assessment Lower Extremity Assessment: Defer to PT evaluation       Communication Communication Communication: No difficulties   Cognition Arousal/Alertness: Lethargic Behavior During Therapy: WFL for tasks assessed/performed;Flat affect Overall Cognitive Status: Within Functional Limits for tasks assessed                     General Comments       Exercises   Other Exercises Other Exercises: supine exercises for LE AROM and strengthening x10 B; Ankle pumps, SLR, SAQ, heel slides. sitting EOB exercises x5 B marching and LAQ. Pt. able to follow verbal instruction to perform all ex's   Shoulder Instructions      Home Living Family/patient expects to be discharged to:: Private residence Living Arrangements: Spouse/significant other Available Help at Discharge: Family Type  of Home: House Home Access: Stairs to enter Secretary/administratorntrance Stairs-Number of Steps: 5 Entrance Stairs-Rails: Left;Right Home Layout: Two level (pt. bedroom/bathroom on second floor)     Bathroom Shower/Tub: Walk-in shower Shower/tub characteristics: Engineer, building servicesCurtain Bathroom Toilet: Standard     Home Equipment: None          Prior Functioning/Environment Level of Independence: Independent        Comments: Pt. performed all ADL's independently prior to admission, married and has 2 children ages 578 and 810 (3rd and 4th grade).  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        OT Problem List: Decreased strength;Impaired balance (sitting and/or standing);Decreased range of motion;Decreased activity tolerance;Decreased coordination;Decreased safety awareness   OT Treatment/Interventions: Self-care/ADL training;Patient/family education;Therapeutic activities;Balance training;Therapeutic exercise;Cognitive remediation/compensation    OT Goals(Current goals can be found in the care plan section) Acute Rehab OT Goals Patient Stated Goal: "be able to start doing more for myself again" OT Goal Formulation: With patient/family Time For Goal Achievement: 08/24/16 Potential to Achieve Goals: Good ADL Goals Pt Will  Perform Eating: Independently;with set-up (with or without adaptive utensils) Pt Will Perform Grooming: with set-up;with supervision;sitting Pt Will Perform Upper Body Dressing: Independently;with set-up Pt Will Perform Lower Body Dressing: with min assist;sit to/from stand (sitting in chair) Pt Will Transfer to Toilet: with min assist;regular height toilet;ambulating Pt/caregiver will Perform Home Exercise Program: Both right and left upper extremity;With theraband;With theraputty;With written HEP provided  OT Frequency: Min 3X/week   Barriers to D/C:            Co-evaluation              End of Session Nurse Communication: Other  (comment) (pt itching her palm of R hand, head and R eye frequently)  Activity Tolerance: Patient limited by fatigue;Patient limited by lethargy Patient left: in chair;with call bell/phone within reach;with chair alarm set;with family/visitor present   Time: 1455-1540 OT Time Calculation (min): 45 min Charges:  OT General Charges $OT Visit: 1 Procedure OT Evaluation $OT Eval Moderate Complexity: 1 Procedure OT Treatments $Self Care/Home Management : 23-37 mins G-Codes:     Susanne Borders, OTR/L ascom [phone] 08/10/16, 3:58 PM

## 2016-08-10 NOTE — Progress Notes (Signed)
[facility] FOR [facility] LAB REPLACEMENT ONLY  The patient does [facility] based on the criteria listed below:   1. Is GFR >/= 40 ml/min? Yes.    Patient's GFR today is >60 2. Is urine output >/= 0.5 ml/kg/hr for the last 6 hours? Yes.   Patient's UOP is 0.81 ml/kg/hr 3. Is BUN < 60 mg/dL? Yes.    Patient's BUN today is 14 4. Abnormal electrolyte K 3.4, Mg 1.7 5. Ordered repletion with: per protocol 6. If a panic level lab has been reported, has the CCM MD in charge been notified? Yes.  .   Physician:  Isac CaddyKasa  Bailea Beed McEachran 08/10/2016 6:05 AM

## 2016-08-10 NOTE — Progress Notes (Signed)
PULMONARY / CRITICAL CARE MEDICINE   Name: Martha GravelJessica Z Schartz MRN: [id number] DOB: 12-Dec-1980    ADMISSION DATE:  08/05/2016  Patient Profile:35 yo white female admitted to ICU for acute mental status changes -patient found unresponsive at home by husband, patient was using drugs +cocaine  Subjective: Patient had a restful night ,was afebrile, will little confusion, a little more awake this morning. Noted to be more interactive in the afternoons.   VITAL SIGNS: Temp:  [97.8 F (36.6 C)-98.3 F (36.8 C)] 98.1 F (36.7 C) (09/20 0800) Pulse Rate:  [26-110] 49 (09/20 0900) Resp:  [18-32] 18 (09/20 0900) BP: (100-140)/(64-114) 136/97 (09/20 0900) SpO2:  [86 %-100 %] 99 % (09/20 0900) Weight:  [235 lb 3.7 oz (106.7 kg)] 235 lb 3.7 oz (106.7 kg) (09/20 0435) HEMODYNAMICS:   VENTILATOR SETTINGS:   INTAKE / OUTPUT:  Intake/Output Summary (Last 24 hours) at 08/10/16 1059 Last data filed at 08/10/16 [id number]  Gross per 24 hour  Intake          2583.33 ml  Output             2420 ml  Net           163.33 ml    PHYSICAL EXAMINATION: PHYSICAL EXAMINATION: Physical Examination:   GENERAL:young white female, confused, no acute distress at this time. HEAD: Normocephalic, atraumatic.  EYES: Pupils equal, round, reactive to light.  No scleral icterus.  MOUTH: Moist mucosal membrane. NECK: Supple. No thyromegaly. No nodules. No JVD. PULMONARY: clear bilaterally, no wheezes, crackles, rhonchi noted. CARDIOVASCULAR: S1 and S2. Regular rate and rhythm. No murmurs, rubs, or gallops.  GASTROINTESTINAL: Soft, nontender, +distended. No masses. Positive bowel sounds. No guarding MUSCULOSKELETAL: No swelling, clubbing, or edema.  NEUROLOGIC:follows simple commands SKIN:intact,warm,dry    LABS:  CBC  Recent Labs Lab 08/06/16 0430 08/07/16 0520 08/08/16 0445  WBC 9.9 9.0 9.6  HGB 11.7* 10.9* 11.5*  HCT 33.3* 31.4* 33.2*  PLT 94* 76* 96*   Coag's No results for input(s): APTT, INR in  the last 168 hours. BMET  Recent Labs Lab 08/08/16 0445 08/09/16 0411 08/09/16 1821 08/10/16 0505  NA 142 143  --  139  K 3.0* 3.1* 3.3* 3.4*  CL 110 109  --  105  CO2 27 26  --  28  BUN 27* 21*  --  14  CREATININE 1.56* 1.18*  --  0.90  GLUCOSE 137* 100*  --  98   Electrolytes  Recent Labs Lab 08/07/16 1658 08/08/16 0445 08/09/16 0411 08/10/16 0505  CALCIUM  --  7.7* 7.7* 7.7*  MG 2.2 2.0 1.5* 1.7  PHOS 2.0* 2.3* 3.2  --    Sepsis Markers  Recent Labs Lab 08/05/16 0736 08/05/16 1039 08/06/16 0430  LATICACIDVEN 3.6* 5.4* 1.2   ABG  Recent Labs Lab 08/06/16 0500 08/06/16 1125 08/07/16 0454  PHART 7.38 7.39 7.42  PCO2ART 41 41 39  PO2ART 130* 105 98   Liver Enzymes  Recent Labs Lab 08/06/16 0430 08/07/16 0520 08/09/16 0411  AST 6,862* 3,096* 340*  ALT 5,266* 4,890* 2,183*  ALKPHOS 32* 35* 38  BILITOT 1.3* 1.1 2.0*  ALBUMIN 2.9* 2.7* 2.8*   Cardiac Enzymes  Recent Labs Lab 08/05/16 0736  TROPONINI 0.83*   Glucose  Recent Labs Lab 08/09/16 0802 08/09/16 1152 08/09/16 1607 08/09/16 2117 08/10/16 0437 08/10/16 0726  GLUCAP 111* 106* 121* 109* 97 96    Imaging No results found. 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 See discussion above. Smaller focus of acute infarction affects the LEFT corpus callosum.  ASSESSMENT / PLAN:  35 yo white female admitted to ICU for acute encephalopathy from cocaine toxicity with neurogenic/cardiogenic shock with severe metabolic acidosis fings concerning for  probable anoxic brain injury, with elevated liver enzymes   PULMONARY -Respiratory Failure- Self extubated 9/18 - on room air , sating well - continue Bronchodilator Therapy - Thick foul-smelling purulent secretions, this was noted before self extubation, will empirically start Unasyn possible tracheitis - follow  cultures  CARDIOVASCULAR Shock-neurogenic/cardiogenic - resolved   GASTROINTESTINAL Shocked Liver - possible due to unresponsive and downtime- improving - dysphagia diet with thin liquids - liver enzymes elevated but trending down rapidly. -Follow LFT's - trending down rapidly -Hep profile negative  HEMATOLOGIC follow CBC intermittently  INFECTIOUS MRSA Tracheitis (treat for 7 days total) - Unasyn 9/18>9/20, stopped due to MRSA in sputum,  - Vanc 9/20>9/27 - resp cx >MRSA  ENDOCRINE - ICU hypoglycemic\Hyperglycemia protocol  NEUROLOGIC Possible anoxic injury - per MRI, GP injury bilaterally -Follow up neurology recommendation,  there is some GP injury, clinical outcome depending on the extent of damage and recovery process.  -Improving neurological status slowly, with some confusion. - cont with OT/PT eval, when able to - follow ST recs - follow neuro recs - patient will have a new baseline mentation, pending her neurologic recovery, this is yet to be determine, but neurology is following along.    Thank you for consulting [facility], we will signoff at this time.  Please feel free to contact us with any questions at [phone] (please enter 7-digits).   Critical Care Time devoted to patient care services described in this note is  35 Minutes.   This time reflects time of care of this signee Dr Stephanie Acre.  This critical care time does not reflect procedure time, or teaching time or supervisory time of PA/NP/Med-student/Med Resident etc but could involve care discussion time.  Stephanie Acre, MD [facility] Pager [phone] (please enter 7-digits) On Call Pager - [phone] (please enter 7-digits)  Note: This note was prepared with Dragon dictation along with smaller phrase technology. Any transcriptional errors that result from this process are unintentional.

## 2016-08-10 NOTE — Evaluation (Signed)
Physical Therapy Evaluation Patient Details Name: Martha Lawson MRN: [id number] DOB: 1981/01/31 Today's Date: 08/10/2016   History of Present Illness  Pt. found unresponsive at home by her husband and admittted for acute respiratory failue +cocaine, intubated 08/05/16 and self extubated 08/08/16.  Pt with acute encephalopathy from cocaine toxicity with neurogenic/cardiogenic shock with severe metabolic acidosis fings concerning for  probable anoxic brain injury, with elevated liver enzymes.  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  Clinical Impression  Pt. Lethargic but oriented supine in bed upon arrival. Pt demonstrates B UE strength 3+/5 symmetrical, B LE strength 3+/5 symmetrical. Sensation intact B UE and B LE symmetrical. She was able to follow commands to perform LE exercises in supine and sitting EOB. Pt. Was able to transfer from supine to sitting EOB, she was able to initiate B LE movement to the EOB but required assist of 2 for trunk support and positioning of B LE off of bed and verbal cues for B UE placement for support. Pt. Able to stand from EOB to RW with assist of 2 for safety and equipment. She was able to take approx. 6 steps with use of RW for B UE support to transfer to the chair demonstrating small slow steps with verbal cues for step placement and RW negotiation, no evidence of buckling or LOB. Pt. Demonstrates significant endurance deficits as she was independent with all functional mobility activities prior to admission and today became fatigued after transferring from supine to EOB and then from EOB to the chair with use of RW Pt. Tolerated treatment well, able to follow all commands demonstrated understanding of instructions and all vitals remained stable throughout activity. Would benefit from skilled PT to address above deficits and promote optimal return to PLOF Recommend transfer to inpatient rehab upon d/c to address further skilled PT needs. Also, recommend  use of RW for all mobility at this time.      CIR    Equipment Recommendations  Rolling walker with 5" wheels    Recommendations for Other Services Rehab consult     Precautions / Restrictions Precautions Precautions: Fall Restrictions Weight Bearing Restrictions: No      Mobility  Bed Mobility Overal bed mobility: + 2 for safety/equipment;+2 for physical assistance Bed Mobility: Supine to Sit     Supine to sit: +2 for safety/equipment;+2 for physical assistance     General bed mobility comments: Pt. able to initiate movement of LE's towards EOB, required assistance to move LE's off of bed and to position at EOB. verbal cues for hand placement to assist with B UE.   Transfers Overall transfer level: Needs assistance Equipment used: Rolling walker (2 wheeled) Transfers: Sit to/from Stand Sit to Stand: +2 physical assistance;+2 safety/equipment         General transfer comment: Pt. able to stand to RW with one UE pushing from EOB.   Ambulation/Gait Ambulation/Gait assistance: +2 safety/equipment;+2 physical assistance Ambulation Distance (Feet): 2 Feet Assistive device: Rolling walker (2 wheeled)       General Gait Details: Pt. took approx. 6 steps from the bed to the chair with use of RW for B UE support; pt. demonstrates small slow steps, verbal cues for step placement and RW negotiation, no LOB or buckling.   Stairs            Wheelchair Mobility    Modified Rankin (Stroke Patients Only)       Balance Overall balance assessment: Needs assistance Sitting-balance support: Feet supported;Bilateral upper  extremity supported Sitting balance-Leahy Scale: Fair Sitting balance - Comments: Pt. initially unable to Sit EOB without mod A. for trunk support. Once placed with feet supported, B UE supported, center of gravity within base of support pt. was able to maintain sitting balance for short periods of time. pt. drifts Right posteriorly but able to recover  sitting balance anteriorly with verbal cues.   Standing balance support: Bilateral upper extremity supported Standing balance-Leahy Scale: Fair                               Pertinent Vitals/Pain Pain Assessment: No/denies pain    Home Living Family/patient expects to be discharged to:: Private residence Living Arrangements: Spouse/significant other Available Help at Discharge: Family Type of Home: House Home Access: Stairs to enter Entrance Stairs-Rails: LawyerLeft;Right Entrance Stairs-Number of Steps: 5 Home Layout: Two level (pt. bedroom/bathroom on second floor) Home Equipment: None      Prior Function Level of Independence: Independent         Comments: Pt. performed all ADL's independently prior to admission, married and has 2 children ages 658 and 5610 (3rd and 4th grade).  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     Hand Dominance   Dominant Hand: Right    Extremity/Trunk Assessment   Upper Extremity Assessment: Generalized weakness           Lower Extremity Assessment: Defer to PT evaluation         Communication   Communication: No difficulties  Cognition Arousal/Alertness: Lethargic Behavior During Therapy: WFL for tasks assessed/performed;Flat affect Overall Cognitive Status: Within Functional Limits for tasks assessed                      General Comments      Exercises Other Exercises Other Exercises: supine exercises for LE AROM and strengthening x10 B; Ankle pumps, SLR, SAQ, heel slides. sitting EOB exercises x5 B marching and LAQ. Pt. able to follow verbal instruction to perform all ex's   Assessment/Plan    PT Assessment Patient needs continued PT services  PT Problem List Decreased strength;Decreased activity tolerance;Decreased balance;Decreased mobility;Decreased knowledge of use of DME          PT Treatment Interventions DME instruction;Gait training;Stair  training;Functional mobility training;Therapeutic activities;Therapeutic exercise;Balance training;Neuromuscular re-education;Patient/family education    PT Goals (Current goals can be found in the Care Plan section)  Acute Rehab PT Goals Patient Stated Goal: "be able to start doing more for myself again" PT Goal Formulation: With patient Time For Goal Achievement: 08/24/16 Potential to Achieve Goals: Good    Frequency 7X/week   Barriers to discharge        Co-evaluation               End of Session Equipment Utilized During Treatment: Gait belt Activity Tolerance: Patient tolerated treatment well;Patient limited by fatigue Patient left: in chair;with call bell/phone within reach;with family/visitor present (pt's mother present in room ) Nurse Communication: Mobility status         Time: 1355-1430 PT Time Calculation (min) (ACUTE ONLY): 35 min   Charges:         PT G CodesHuntley Dec:        Lukas Pelcher 08/10/2016, 4:37 PM

## 2016-08-10 NOTE — Progress Notes (Addendum)
Subjective: Patient now being taken off Precedex.  Taking food orally but requires assistance.  Has not been able to work with PT or OT.  Continues to have visual hallucinations per mother.    Objective: Current vital signs: BP (!) 136/97   Pulse (!) 49   Temp 98.1 F (36.7 C) (Axillary)   Resp 18   Ht 5\' 5"  (1.651 m)   Wt 106.7 kg (235 lb 3.7 oz)   LMP  (LMP Unknown) Comment: neg preg test  SpO2 99%   BMI 39.14 kg/m  Vital signs in last 24 hours: Temp:  [97.8 F (36.6 C)-98.3 F (36.8 C)] 98.1 F (36.7 C) (09/20 0800) Pulse Rate:  [26-110] 49 (09/20 0900) Resp:  [18-32] 18 (09/20 0900) BP: (100-140)/(64-114) 136/97 (09/20 0900) SpO2:  [86 %-100 %] 99 % (09/20 0900) Weight:  [106.7 kg (235 lb 3.7 oz)] 106.7 kg (235 lb 3.7 oz) (09/20 0435)  Intake/Output from previous day: 09/19 0701 - 09/20 0700 In: 3383.3 [I.V.:1433.3; IV Piggyback:1950] Out: 2835 [Urine:2835] Intake/Output this shift: Total I/O In: 9 [I.V.:9] Out: -  Nutritional status: DIET - DYS 1 Room service appropriate? Yes with Assist; Fluid consistency: Thin  Neurologic Exam: Mental Status: Lethargic but easily alerted when name called. Able to tell me where she is and able to follow some commands the include the ability to differentiate left and right and out up two fingers.  Speech fluent.   Cranial Nerves: II: Discs flat bilaterally III,IV, VI: Extra-ocular motions intact bilaterally V,VII: smile symmetric VIII: hearing normal bilaterally IX,X: gag reflex present XI: unable to test XII: midline tongue extension Motor: Moves all extremities weakly but against gravity to command with no focal weakness noted Sensory: Responds to light touch thoughout Deep Tendon Reflexes: 2+ and symmetric throughout   Lab Results: Basic Metabolic Panel:  Recent Labs Lab 08/06/16 0430  08/06/16 1419 08/07/16 0520 08/07/16 1658 08/08/16 0445 08/09/16 0411 08/09/16 1821 08/10/16 0505  NA 140  --   --  139  --   142 143  --  139  K 3.8  --   --  3.2*  --  3.0* 3.1* 3.3* 3.4*  CL 109  --   --  110  --  110 109  --  105  CO2 25  --   --  24  --  27 26  --  28  GLUCOSE 105*  --   --  138*  --  137* 100*  --  98  BUN 22*  --   --  34*  --  27* 21*  --  14  CREATININE 2.41*  --   --  2.14*  --  1.56* 1.18*  --  0.90  CALCIUM 6.9*  --   --  7.4*  --  7.7* 7.7*  --  7.7*  MG  --   < > 2.0 2.2 2.2 2.0 1.5*  --  1.7  PHOS  --   --  2.8 2.1* 2.0* 2.3* 3.2  --   --   < > = values in this interval not displayed.  Liver Function Tests:  Recent Labs Lab 08/05/16 0736 08/06/16 0430 08/07/16 0520 08/09/16 0411  AST 319* 6,862* 3,096* 340*  ALT 395* 5,266* 4,890* 2,183*  ALKPHOS 36* 32* 35* 38  BILITOT 0.7 1.3* 1.1 2.0*  PROT 6.6 5.4* 5.3* 5.9*  ALBUMIN 3.4* 2.9* 2.7* 2.8*   No results for input(s): LIPASE, AMYLASE in the last 168 hours. No results for input(s):  AMMONIA in the last 168 hours.  CBC:  Recent Labs Lab 08/05/16 0736 08/06/16 0430 08/07/16 0520 08/08/16 0445  WBC 12.9* 9.9 9.0 9.6  NEUTROABS 11.4*  --   --   --   HGB 9.5* 11.7* 10.9* 11.5*  HCT 29.8* 33.3* 31.4* 33.2*  MCV 95.9 91.2 91.6 91.5  PLT 141* 94* 76* 96*    Cardiac Enzymes:  Recent Labs Lab 08/05/16 0736  TROPONINI 0.83*    Lipid Panel:  Recent Labs Lab 08/05/16 0736  TRIG 101    CBG:  Recent Labs Lab 08/09/16 1607 08/09/16 2117 08/10/16 0437 08/10/16 0726 08/10/16 1148  GLUCAP 121* 109* 97 96 93    Microbiology: Results for orders placed or performed during the hospital encounter of 08/05/16  Blood culture (routine x 2)     Status: None   Collection Time: 08/05/16  9:12 AM  Result Value Ref Range Status   Specimen Description BLOOD CENTRAL LINE  Final   Special Requests BOTTLES DRAWN AEROBIC AND ANAEROBIC  4CC  Final   Culture NO GROWTH 5 DAYS  Final   Report Status 08/10/2016 FINAL  Final  Blood culture (routine x 2)     Status: None (Preliminary result)   Collection Time: 08/05/16   9:12 AM  Result Value Ref Range Status   Specimen Description BLOOD RIGHT ARM  Final   Special Requests BOTTLES DRAWN AEROBIC AND ANAEROBIC  5CC  Final   Culture  Setup Time   Final    Organism ID to follow GRAM POSITIVE RODS CRITICAL RESULT CALLED TO, READ BACK BY AND VERIFIED WITH: HANK ZOMPA 08/10/16 1030 SGD ANAEROBIC BOTTLE ONLY    Culture GRAM POSITIVE RODS  Final   Report Status PENDING  Incomplete  Blood Culture ID Panel (Reflexed)     Status: None   Collection Time: 08/05/16  9:12 AM  Result Value Ref Range Status   Enterococcus species NOT DETECTED NOT DETECTED Final   Vancomycin resistance NOT DETECTED NOT DETECTED Final   Listeria monocytogenes NOT DETECTED NOT DETECTED Final   Staphylococcus species NOT DETECTED NOT DETECTED Final   Staphylococcus aureus NOT DETECTED NOT DETECTED Final   Methicillin resistance NOT DETECTED NOT DETECTED Final   Streptococcus species NOT DETECTED NOT DETECTED Final   Streptococcus agalactiae NOT DETECTED NOT DETECTED Final   Streptococcus pneumoniae NOT DETECTED NOT DETECTED Final   Streptococcus pyogenes NOT DETECTED NOT DETECTED Final   Acinetobacter baumannii NOT DETECTED NOT DETECTED Final   Enterobacteriaceae species NOT DETECTED NOT DETECTED Final   Enterobacter cloacae complex NOT DETECTED NOT DETECTED Final   Escherichia coli NOT DETECTED NOT DETECTED Final   Klebsiella oxytoca NOT DETECTED NOT DETECTED Final   Klebsiella pneumoniae NOT DETECTED NOT DETECTED Final   Proteus species NOT DETECTED NOT DETECTED Final   Serratia marcescens NOT DETECTED NOT DETECTED Final   Carbapenem resistance NOT DETECTED NOT DETECTED Final   Haemophilus influenzae NOT DETECTED NOT DETECTED Final   Neisseria meningitidis NOT DETECTED NOT DETECTED Final   Pseudomonas aeruginosa NOT DETECTED NOT DETECTED Final   Candida albicans NOT DETECTED NOT DETECTED Final   Candida glabrata NOT DETECTED NOT DETECTED Final   Candida krusei NOT DETECTED NOT  DETECTED Final   Candida parapsilosis NOT DETECTED NOT DETECTED Final   Candida tropicalis NOT DETECTED NOT DETECTED Final  MRSA PCR Screening     Status: Abnormal   Collection Time: 08/05/16 12:25 PM  Result Value Ref Range Status   MRSA by PCR POSITIVE (  A) NEGATIVE Final    Comment:        The GeneXpert MRSA Assay (FDA approved for NASAL specimens only), is one component of a comprehensive MRSA colonization surveillance program. It is not intended to diagnose MRSA infection nor to guide or monitor treatment for MRSA infections. RESULT CALLED TO, READ BACK BY AND VERIFIED WITH: RACHEL VERDI 08/05/16 1348 SGD   Culture, expectorated sputum-assessment     Status: None   Collection Time: 08/05/16  1:32 PM  Result Value Ref Range Status   Specimen Description EXPECTORATED SPUTUM  Final   Special Requests NONE  Final   Sputum evaluation THIS SPECIMEN IS ACCEPTABLE FOR SPUTUM CULTURE  Final   Report Status 08/05/2016 FINAL  Final  Culture, respiratory (NON-Expectorated)     Status: None   Collection Time: 08/05/16  1:32 PM  Result Value Ref Range Status   Specimen Description EXPECTORATED SPUTUM  Final   Special Requests NONE Reflexed from [id number]  Final   Gram Stain   Final    ABUNDANT WBC PRESENT, PREDOMINANTLY PMN ABUNDANT GRAM POSITIVE COCCI IN CLUSTERS MODERATE GRAM POSITIVE COCCI IN PAIRS AND CHAINS FEW GRAM POSITIVE RODS FEW GRAM NEGATIVE COCCOBACILLI Performed at [facility]    Culture FEW METHICILLIN RESISTANT STAPHYLOCOCCUS AUREUS  Final   Report Status 08/09/2016 FINAL  Final   Organism ID, Bacteria METHICILLIN RESISTANT STAPHYLOCOCCUS AUREUS  Final      Susceptibility   Methicillin resistant staphylococcus aureus - MIC*    CIPROFLOXACIN <=0.5 SENSITIVE Sensitive     ERYTHROMYCIN >=8 RESISTANT Resistant     GENTAMICIN <=0.5 SENSITIVE Sensitive     OXACILLIN >=4 RESISTANT Resistant     TETRACYCLINE <=1 SENSITIVE Sensitive     VANCOMYCIN 1 SENSITIVE  Sensitive     TRIMETH/SULFA <=10 SENSITIVE Sensitive     CLINDAMYCIN <=0.25 SENSITIVE Sensitive     RIFAMPIN <=0.5 SENSITIVE Sensitive     Inducible Clindamycin NEGATIVE Sensitive     * FEW METHICILLIN RESISTANT STAPHYLOCOCCUS AUREUS  Culture, expectorated sputum-assessment     Status: None   Collection Time: 08/08/16 11:00 AM  Result Value Ref Range Status   Specimen Description EXPECTORATED SPUTUM  Final   Special Requests Normal  Final   Sputum evaluation THIS SPECIMEN IS ACCEPTABLE FOR SPUTUM CULTURE  Final   Report Status 08/08/2016 FINAL  Final  Culture, respiratory (NON-Expectorated)     Status: None (Preliminary result)   Collection Time: 08/08/16 11:00 AM  Result Value Ref Range Status   Specimen Description EXPECTORATED SPUTUM  Final   Special Requests Normal Reflexed from [id number]  Final   Gram Stain   Final    NO WBC SEEN FEW GRAM POSITIVE COCCI IN PAIRS AND CHAINS FEW GRAM NEGATIVE RODS RARE GRAM POSITIVE RODS Performed at [facility]    Culture MODERATE STAPHYLOCOCCUS AUREUS  Final   Report Status PENDING  Incomplete    Coagulation Studies: No results for input(s): LABPROT, INR in the last 72 hours.  Imaging: No results found.  Medications:  I have reviewed the patient's current medications. Scheduled: . sennosides  5 mL Oral BID   And  . docusate  100 mg Oral BID  . enoxaparin (LOVENOX) injection  40 mg Subcutaneous Q24H  . famotidine (PEPCID) IV  20 mg Intravenous Q12H  . mouth rinse  15 mL Mouth Rinse BID  . mupirocin ointment  1 application Nasal BID  . polyethylene glycol  17 g Oral Daily  . potassium chloride  10  mEq Intravenous Q1 Hr x 4  . sodium chloride flush  3 mL Intravenous Q12H  . vancomycin  1,500 mg Intravenous Q12H    Assessment/Plan: Sedation being discontinued.  Patient remains lethargic for now.  Unclear if she will need further sedation but should be able to take po if necessary.  Will not start anything scheduled at this  time.    Recommendations: 1.  Ativan 1-2mg  prn agitation 2.  If Ativan required will start Seroquel, scheduled.   3.  Will continue to follow with you.     LOS: 5 days   Thana Farr, MD Neurology [phone] 08/10/2016  12:02 PM

## 2016-08-10 NOTE — Plan of Care (Signed)
Problem: SLP Dysphagia Goals Goal: Misc Dysphagia Goal Pt will safely tolerate po diet of least restrictive consistency w/ no overt s/s of aspiration noted by Staff/pt/family x3 sessions.    

## 2016-08-11 LAB — CBC
HEMATOCRIT: 29.6 % — AB (ref 35.0–47.0)
Hemoglobin: 10.4 g/dL — ABNORMAL LOW (ref 12.0–16.0)
MCH: 32.1 pg (ref 26.0–34.0)
MCHC: 35 g/dL (ref 32.0–36.0)
MCV: 91.5 fL (ref 80.0–100.0)
PLATELETS: 128 10*3/uL — AB (ref 150–440)
RBC: 3.23 MIL/uL — ABNORMAL LOW (ref 3.80–5.20)
RDW: 13.3 % (ref 11.5–14.5)
WBC: 8.7 10*3/uL (ref 3.6–11.0)

## 2016-08-11 LAB — COMPREHENSIVE METABOLIC PANEL
ALBUMIN: 2.7 g/dL — AB (ref 3.5–5.0)
ALT: 909 U/L — ABNORMAL HIGH (ref 14–54)
ANION GAP: 4 — AB (ref 5–15)
AST: 73 U/L — AB (ref 15–41)
Alkaline Phosphatase: 37 U/L — ABNORMAL LOW (ref 38–126)
BILIRUBIN TOTAL: 1.2 mg/dL (ref 0.3–1.2)
BUN: 11 mg/dL (ref 6–20)
CHLORIDE: 106 mmol/L (ref 101–111)
CO2: 29 mmol/L (ref 22–32)
Calcium: 8 mg/dL — ABNORMAL LOW (ref 8.9–10.3)
Creatinine, Ser: 0.89 mg/dL (ref 0.44–1.00)
GFR calc Af Amer: >60 mL/min (ref >60)
Glucose, Bld: 119 mg/dL — ABNORMAL HIGH (ref 65–99)
POTASSIUM: 3.5 mmol/L (ref 3.5–5.1)
Sodium: 139 mmol/L (ref 135–145)
TOTAL PROTEIN: 5.7 g/dL — AB (ref 6.5–8.1)

## 2016-08-11 LAB — GLUCOSE, CAPILLARY
GLUCOSE-CAPILLARY: 74 mg/dL (ref 65–99)
GLUCOSE-CAPILLARY: 104 mg/dL — AB (ref 65–99)
GLUCOSE-CAPILLARY: 122 mg/dL — AB (ref 65–99)
Glucose-Capillary: 103 mg/dL — ABNORMAL HIGH (ref 65–99)
Glucose-Capillary: 70 mg/dL (ref 65–99)

## 2016-08-11 LAB — CULTURE, RESPIRATORY W GRAM STAIN: Special Requests: NORMAL

## 2016-08-11 LAB — HEPATIC FUNCTION PANEL
ALK PHOS: 39 U/L (ref 38–126)
ALT: 988 U/L — AB (ref 14–54)
AST: 74 U/L — ABNORMAL HIGH (ref 15–41)
Albumin: 2.8 g/dL — ABNORMAL LOW (ref 3.5–5.0)
BILIRUBIN INDIRECT: 0.9 mg/dL (ref 0.3–0.9)
BILIRUBIN TOTAL: 1.2 mg/dL (ref 0.3–1.2)
Bilirubin, Direct: 0.3 mg/dL (ref 0.1–0.5)
Total Protein: 5.8 g/dL — ABNORMAL LOW (ref 6.5–8.1)

## 2016-08-11 LAB — CULTURE, RESPIRATORY: GRAM STAIN: NONE SEEN

## 2016-08-11 LAB — AMMONIA: AMMONIA: 20 umol/L (ref 9–35)

## 2016-08-11 LAB — MAGNESIUM: Magnesium: 1.6 mg/dL — ABNORMAL LOW (ref 1.7–2.4)

## 2016-08-11 MED ORDER — FAMOTIDINE 20 MG PO TABS
20.0000 mg | ORAL_TABLET | Freq: Two times a day (BID) | ORAL | Status: DC
  Administered 2016-08-11: 20 mg via ORAL

## 2016-08-11 MED ORDER — MAGNESIUM SULFATE 4 GM/100ML IV SOLN
4.0000 g | Freq: Once | INTRAVENOUS | Status: AC
  Administered 2016-08-11: 4 g via INTRAVENOUS
  Filled 2016-08-11: qty 100

## 2016-08-11 MED ORDER — CLINDAMYCIN HCL 150 MG PO CAPS
300.0000 mg | ORAL_CAPSULE | Freq: Four times a day (QID) | ORAL | Status: DC
  Administered 2016-08-11 – 2016-08-12 (×4): 300 mg via ORAL
  Filled 2016-08-11: qty 1
  Filled 2016-08-11 (×3): qty 2

## 2016-08-11 MED ORDER — POTASSIUM CHLORIDE 20 MEQ PO PACK
40.0000 meq | PACK | Freq: Once | ORAL | Status: AC
  Administered 2016-08-11: 40 meq via ORAL
  Filled 2016-08-11: qty 2

## 2016-08-11 NOTE — Progress Notes (Signed)
Inpatient Rehabilitation  Spoke with patient's family about [facility] and they are in agreement with team's recommendations.  Pending medical clearance tomorrow morning plan to admit patient to IP Rehab have notified RNCM.  Please call with questions.   Charlane FerrettiMelissa Zahari Fazzino, M.A., CCC/SLP Admission Coordinator  [facility]  Cell [phone]

## 2016-08-11 NOTE — Progress Notes (Signed)
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   Plan:  1. Will replace magnesium 4g x1 and potassium 40mEq PO x 1. Will recheck electrolytes with am labs.   2. Vancomycin changed to clindamycin for completion of 7 day course.     Allergies  Allergen Reactions  . Aspirin     Patient Measurements: Height: 5\' 5"  (165.1 cm) Weight: 233 lb 11 oz (106 kg) IBW/kg (Calculated) : 57   Vital Signs: Temp: 98.6 F (37 C) (09/21 1516) Temp Source: Oral (09/21 1516) BP: 129/85 (09/21 1516) Pulse Rate: 72 (09/21 1516) Intake/Output from previous day: 09/20 0701 - 09/21 0700 In: 1349 [P.O.:240; I.V.:559; IV Piggyback:550] Out: 4050 [Urine:4050] Intake/Output from this shift: Total I/O In: -  Out: 1050 [Urine:1050]  Labs:  Recent Labs  08/11/16 0500  WBC 8.7  HGB 10.4*  HCT 29.6*  PLT 128*     Recent Labs  08/09/16 0411 08/09/16 1821 08/10/16 0505 08/11/16 0500 08/11/16 0513  NA 143  --  139  --  139  K 3.1* 3.3* 3.4*  --  3.5  CL 109  --  105  --  106  CO2 26  --  28  --  29  GLUCOSE 100*  --  98  --  119*  BUN 21*  --  14  --  11  CREATININE 1.18*  --  0.90  --  0.89  CALCIUM 7.7*  --  7.7*  --  8.0*  MG 1.5*  --  1.7 1.6*  --   PHOS 3.2  --   --   --   --   PROT 5.9*  --   --  5.8* 5.7*  ALBUMIN 2.8*  --   --  2.8* 2.7*  AST 340*  --   --  74* 73*  ALT 2,183*  --   --  988* 909*  ALKPHOS 38  --   --  39 37*  BILITOT 2.0*  --   --  1.2 1.2  BILIDIR 0.7*  --   --  0.3  --   IBILI 1.3*  --   --  0.9  --    Estimated Creatinine Clearance: 108.7 mL/min (by C-G formula based on SCr of 0.89 mg/dL).    Recent Labs  08/11/16 0345 08/11/16 0713 08/11/16 1130  GLUCAP 74 104* 122*    Medications:  Scheduled:  . clindamycin  300 mg Oral Q6H  . enoxaparin (LOVENOX) injection  40 mg Subcutaneous Q24H  . mupirocin ointment  1 application  Nasal BID   Pharmacy will continue to monitor and adjust per consult.     MLS 08/11/2016 4:30 PM

## 2016-08-11 NOTE — Progress Notes (Addendum)
Subjective: Patient more alert today.  Has started to work with therapy.  Requiring 2+ assist.   Objective: Current vital signs: BP 125/79   Pulse 61   Temp 98.3 F (36.8 C) (Oral)   Resp 16   Ht 5\' 5"  (1.651 m)   Wt 106 kg (233 lb 11 oz)   LMP  (LMP Unknown) Comment: neg preg test  SpO2 94%   BMI 38.89 kg/m  Vital signs in last 24 hours: Temp:  [98.1 F (36.7 C)-98.3 F (36.8 C)] 98.3 F (36.8 C) (09/21 0500) Pulse Rate:  [45-83] 61 (09/21 0700) Resp:  [11-25] 16 (09/21 0700) BP: (109-147)/(79-116) 125/79 (09/21 0700) SpO2:  [73 %-100 %] 94 % (09/21 0700) Weight:  [106 kg (233 lb 11 oz)] 106 kg (233 lb 11 oz) (09/21 0451)  Intake/Output from previous day: 09/20 0701 - 09/21 0700 In: 1349 [P.O.:240; I.V.:559; IV Piggyback:550] Out: 4050 [Urine:4050] Intake/Output this shift: Total I/O In: -  Out: 500 [Urine:500] Nutritional status: DIET - DYS 1 Room service appropriate? Yes with Assist; Fluid consistency: Thin  Neurologic Exam: Mental Status: More alert.Able to tell me where she is and able to follow some commands the include the ability to differentiate left and right and out up two fingers. Speech fluent.  Cranial Nerves: II: Discs flat bilaterally III,IV, VI: Extra-ocular motions intact bilaterally V,VII: smile symmetric VIII: hearing normal bilaterally IX,X: gag reflex present XI: unable to test XII: midline tongue extension Motor: Moves all extremities weakly but against gravity to command with no focal weakness noted Sensory: Responds to light touch thoughout Deep Tendon Reflexes: 2+ and symmetric throughout Cerebellar: Finger to nose intact.  Decreased RAM  Lab Results: Basic Metabolic Panel:  Recent Labs Lab 08/06/16 1419 08/07/16 0520 08/07/16 1658 08/08/16 0445 08/09/16 0411 08/09/16 1821 08/10/16 0505 08/11/16 0500 08/11/16 0513  NA  --  139  --  142 143  --  139  --  139  K  --  3.2*  --  3.0* 3.1* 3.3* 3.4*  --  3.5  CL  --  110   --  110 109  --  105  --  106  CO2  --  24  --  27 26  --  28  --  29  GLUCOSE  --  138*  --  137* 100*  --  98  --  119*  BUN  --  34*  --  27* 21*  --  14  --  11  CREATININE  --  2.14*  --  1.56* 1.18*  --  0.90  --  0.89  CALCIUM  --  7.4*  --  7.7* 7.7*  --  7.7*  --  8.0*  MG 2.0 2.2 2.2 2.0 1.5*  --  1.7 1.6*  --   PHOS 2.8 2.1* 2.0* 2.3* 3.2  --   --   --   --     Liver Function Tests:  Recent Labs Lab 08/06/16 0430 08/07/16 0520 08/09/16 0411 08/11/16 0500 08/11/16 0513  AST 6,862* 3,096* 340* 74* 73*  ALT 5,266* 4,890* 2,183* 988* 909*  ALKPHOS 32* 35* 38 39 37*  BILITOT 1.3* 1.1 2.0* 1.2 1.2  PROT 5.4* 5.3* 5.9* 5.8* 5.7*  ALBUMIN 2.9* 2.7* 2.8* 2.8* 2.7*   No results for input(s): LIPASE, AMYLASE in the last 168 hours. No results for input(s): AMMONIA in the last 168 hours.  CBC:  Recent Labs Lab 08/05/16 0736 08/06/16 0430 08/07/16 0520 08/08/16 0445 08/11/16  0500  WBC 12.9* 9.9 9.0 9.6 8.7  NEUTROABS 11.4*  --   --   --   --   HGB 9.5* 11.7* 10.9* 11.5* 10.4*  HCT 29.8* 33.3* 31.4* 33.2* 29.6*  MCV 95.9 91.2 91.6 91.5 91.5  PLT 141* 94* 76* 96* 128*    Cardiac Enzymes:  Recent Labs Lab 08/05/16 0736  TROPONINI 0.83*    Lipid Panel:  Recent Labs Lab 08/05/16 0736  TRIG 101    CBG:  Recent Labs Lab 08/10/16 1637 08/10/16 1933 08/10/16 2358 08/11/16 0345 08/11/16 0713  GLUCAP 122* 130* 101* 74 104*    Microbiology: Results for orders placed or performed during the hospital encounter of 08/05/16  Blood culture (routine x 2)     Status: None   Collection Time: 08/05/16  9:12 AM  Result Value Ref Range Status   Specimen Description BLOOD CENTRAL LINE  Final   Special Requests BOTTLES DRAWN AEROBIC AND ANAEROBIC  4CC  Final   Culture NO GROWTH 5 DAYS  Final   Report Status 08/10/2016 FINAL  Final  Blood culture (routine x 2)     Status: None (Preliminary result)   Collection Time: 08/05/16  9:12 AM  Result Value Ref Range  Status   Specimen Description BLOOD RIGHT ARM  Final   Special Requests BOTTLES DRAWN AEROBIC AND ANAEROBIC  5CC  Final   Culture  Setup Time   Final    GRAM POSITIVE RODS CRITICAL RESULT CALLED TO, READ BACK BY AND VERIFIED WITH: HANK ZOMPA 08/10/16 1030 SGD ANAEROBIC BOTTLE ONLY Performed at [facility]    Culture GRAM POSITIVE RODS  Final   Report Status PENDING  Incomplete  Blood Culture ID Panel (Reflexed)     Status: None   Collection Time: 08/05/16  9:12 AM  Result Value Ref Range Status   Enterococcus species NOT DETECTED NOT DETECTED Final   Vancomycin resistance NOT DETECTED NOT DETECTED Final   Listeria monocytogenes NOT DETECTED NOT DETECTED Final   Staphylococcus species NOT DETECTED NOT DETECTED Final   Staphylococcus aureus NOT DETECTED NOT DETECTED Final   Methicillin resistance NOT DETECTED NOT DETECTED Final   Streptococcus species NOT DETECTED NOT DETECTED Final   Streptococcus agalactiae NOT DETECTED NOT DETECTED Final   Streptococcus pneumoniae NOT DETECTED NOT DETECTED Final   Streptococcus pyogenes NOT DETECTED NOT DETECTED Final   Acinetobacter baumannii NOT DETECTED NOT DETECTED Final   Enterobacteriaceae species NOT DETECTED NOT DETECTED Final   Enterobacter cloacae complex NOT DETECTED NOT DETECTED Final   Escherichia coli NOT DETECTED NOT DETECTED Final   Klebsiella oxytoca NOT DETECTED NOT DETECTED Final   Klebsiella pneumoniae NOT DETECTED NOT DETECTED Final   Proteus species NOT DETECTED NOT DETECTED Final   Serratia marcescens NOT DETECTED NOT DETECTED Final   Carbapenem resistance NOT DETECTED NOT DETECTED Final   Haemophilus influenzae NOT DETECTED NOT DETECTED Final   Neisseria meningitidis NOT DETECTED NOT DETECTED Final   Pseudomonas aeruginosa NOT DETECTED NOT DETECTED Final   Candida albicans NOT DETECTED NOT DETECTED Final   Candida glabrata NOT DETECTED NOT DETECTED Final   Candida krusei NOT DETECTED NOT DETECTED Final   Candida  parapsilosis NOT DETECTED NOT DETECTED Final   Candida tropicalis NOT DETECTED NOT DETECTED Final  MRSA PCR Screening     Status: Abnormal   Collection Time: 08/05/16 12:25 PM  Result Value Ref Range Status   MRSA by PCR POSITIVE (A) NEGATIVE Final    Comment:  The GeneXpert MRSA Assay (FDA approved for NASAL specimens only), is one component of a comprehensive MRSA colonization surveillance program. It is not intended to diagnose MRSA infection nor to guide or monitor treatment for MRSA infections. RESULT CALLED TO, READ BACK BY AND VERIFIED WITH: RACHEL VERDI 08/05/16 1348 SGD   Culture, expectorated sputum-assessment     Status: None   Collection Time: 08/05/16  1:32 PM  Result Value Ref Range Status   Specimen Description EXPECTORATED SPUTUM  Final   Special Requests NONE  Final   Sputum evaluation THIS SPECIMEN IS ACCEPTABLE FOR SPUTUM CULTURE  Final   Report Status 08/05/2016 FINAL  Final  Culture, respiratory (NON-Expectorated)     Status: None   Collection Time: 08/05/16  1:32 PM  Result Value Ref Range Status   Specimen Description EXPECTORATED SPUTUM  Final   Special Requests NONE Reflexed from [id number]  Final   Gram Stain   Final    ABUNDANT WBC PRESENT, PREDOMINANTLY PMN ABUNDANT GRAM POSITIVE COCCI IN CLUSTERS MODERATE GRAM POSITIVE COCCI IN PAIRS AND CHAINS FEW GRAM POSITIVE RODS FEW GRAM NEGATIVE COCCOBACILLI Performed at [facility]    Culture FEW METHICILLIN RESISTANT STAPHYLOCOCCUS AUREUS  Final   Report Status 08/09/2016 FINAL  Final   Organism ID, Bacteria METHICILLIN RESISTANT STAPHYLOCOCCUS AUREUS  Final      Susceptibility   Methicillin resistant staphylococcus aureus - MIC*    CIPROFLOXACIN <=0.5 SENSITIVE Sensitive     ERYTHROMYCIN >=8 RESISTANT Resistant     GENTAMICIN <=0.5 SENSITIVE Sensitive     OXACILLIN >=4 RESISTANT Resistant     TETRACYCLINE <=1 SENSITIVE Sensitive     VANCOMYCIN 1 SENSITIVE Sensitive     TRIMETH/SULFA <=10  SENSITIVE Sensitive     CLINDAMYCIN <=0.25 SENSITIVE Sensitive     RIFAMPIN <=0.5 SENSITIVE Sensitive     Inducible Clindamycin NEGATIVE Sensitive     * FEW METHICILLIN RESISTANT STAPHYLOCOCCUS AUREUS  Culture, expectorated sputum-assessment     Status: None   Collection Time: 08/08/16 11:00 AM  Result Value Ref Range Status   Specimen Description EXPECTORATED SPUTUM  Final   Special Requests Normal  Final   Sputum evaluation THIS SPECIMEN IS ACCEPTABLE FOR SPUTUM CULTURE  Final   Report Status 08/08/2016 FINAL  Final  Culture, respiratory (NON-Expectorated)     Status: None   Collection Time: 08/08/16 11:00 AM  Result Value Ref Range Status   Specimen Description EXPECTORATED SPUTUM  Final   Special Requests Normal Reflexed from [id number]  Final   Gram Stain   Final    NO WBC SEEN FEW GRAM POSITIVE COCCI IN PAIRS AND CHAINS FEW GRAM NEGATIVE RODS RARE GRAM POSITIVE RODS Performed at [facility]    Culture   Final    MODERATE METHICILLIN RESISTANT STAPHYLOCOCCUS AUREUS   Report Status 08/11/2016 FINAL  Final   Organism ID, Bacteria METHICILLIN RESISTANT STAPHYLOCOCCUS AUREUS  Final      Susceptibility   Methicillin resistant staphylococcus aureus - MIC*    CIPROFLOXACIN <=0.5 SENSITIVE Sensitive     ERYTHROMYCIN >=8 RESISTANT Resistant     GENTAMICIN <=0.5 SENSITIVE Sensitive     OXACILLIN >=4 RESISTANT Resistant     TETRACYCLINE <=1 SENSITIVE Sensitive     VANCOMYCIN 1 SENSITIVE Sensitive     TRIMETH/SULFA <=10 SENSITIVE Sensitive     CLINDAMYCIN <=0.25 SENSITIVE Sensitive     RIFAMPIN <=0.5 SENSITIVE Sensitive     Inducible Clindamycin NEGATIVE Sensitive     * MODERATE METHICILLIN RESISTANT STAPHYLOCOCCUS  AUREUS    Coagulation Studies: No results for input(s): LABPROT, INR in the last 72 hours.  Imaging: No results found.  Medications:  I have reviewed the patient's current medications. Scheduled: . sennosides  5 mL Oral BID   And  . docusate  100 mg Oral  BID  . enoxaparin (LOVENOX) injection  40 mg Subcutaneous Q24H  . famotidine (PEPCID) IV  20 mg Intravenous Q12H  . magnesium sulfate 1 - 4 g bolus IVPB  4 g Intravenous Once  . mouth rinse  15 mL Mouth Rinse BID  . mupirocin ointment  1 application Nasal BID  . polyethylene glycol  17 g Oral Daily  . sodium chloride flush  3 mL Intravenous Q12H  . vancomycin  1,500 mg Intravenous Q12H    Assessment/Plan: Patient improving.  Has not required any further sedation or anxiolytics.  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    Recommendations: 1.  Continue therapy.     LOS: 6 days   Thana Farr, MD Neurology [phone] 08/11/2016  10:51 AM

## 2016-08-11 NOTE — PMR Pre-admission (Signed)
[facility] PMR Admission Coordinator Pre-Admission Assessment  Patient: Martha Lawson is an 35 y.o., female MRN: [id number] DOB: [birth year] Height: 5\' 5"  (165.1 cm) Weight: 103.8 kg (228 lb 12.8 oz)  Insurance Information HMO:     PPO:      PCP:      IPA:      80/20:      OTHER:  PRIMARY: uninsured       Policy#:       Subscriber:  CM Name:       Phone#:      Fax#:  Pre-Cert#:       Employer:  Benefits:  Phone #:      Name:  Eff. Date:      Deduct:       Out of Pocket Max:       Life Max:  CIR:       SNF:  Outpatient:      Co-Pay:  Home Health:       Co-Pay:  DME:      Co-Pay:  Providers:   Medicaid Application Date:       Case Manager:  Disability Application Date:       Case Worker:   Emergency Contact Information Contact Information    Name Relation Home Work Dulce  [phone]     Lopez,Cindy Mother   [phone]   Lopez,Ray Father   [phone]      Current Medical History  Patient Admitting Diagnosis: Anoxic Brain Injury   History of Present Illness: Martha Standley Smithis a 35 y.o.femalewith history of polysubstance abuse who was using drugs and was found unresponsive at home on 08/05/16. UDS positive for cocaine, benzos, opiates and amphetamines. Patient foaming at mouth with GCS 3. She was intubated in ED and placed on pressors for neurogenic shock from cocaine toxicity with severe metabolic acidosis.  CT head negative for acute changes and neurology recommended full work up to rule out ABI.  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  Cardiology felt that boderline elevation in troponin likely due to demand ischemia.  Patient self extubated on 9/18 and noted to have foul smelling secretions positive for MRSA--antibiotics adjusted to Vancomycin--> clindamycin.  Diet advanced to regular as attention and alertness improving. She  has had issues with agitation, fluctuating bouts of lethargy as well as hallucinations. Therapies recommending IP Rehab for post acute therapy and patient admitted.    [facility] has been reviewed by the rehabilitation admission coordinator and physician.  NIH Stroke scale: N/A Glascow Coma Scale: 15  Past Medical History  Past Medical History:  Diagnosis Date  . Asthma     Family History   family history is not on file.  Prior Rehab/Hospitalizations Has the patient had major surgery during 100 days prior to admission? No    Current Medications See MAR from [facility] records    Patients Current Diet:  Regular textures and thin liquids with aspiration precautions and full supervision   Precautions / Restrictions Precautions Precautions: Fall Restrictions Weight Bearing Restrictions: No   Has the patient had 2 or more falls or a fall with injury in the past year?No  Prior Activity Level Limited Community (1-2x/wk): Prior to admission patient was independent, not working, and lived with her spouse and 2 children.  Per her mother's report she and her husband  were having a hard time, and not always getting along.  Per her mother she was depressed and would use drugs with her spouse.     Prior Functional Level Self Care: Did the patient need help bathing, dressing, using the toilet or eating? Independent  Indoor Mobility: Did the patient need assistance with walking from room to room (with or without device)? Independent  Stairs: Did the patient need assistance with internal or external stairs (with or without device)? Independent  Functional Cognition: Did the patient need help planning regular tasks such as shopping or remembering to take medications? Independent  Home Assistive Devices / Equipment Home Assistive Devices/Equipment: None Home Equipment: None  Prior Device Use: Indicate devices/aids used by the patient  prior to current illness, exacerbation or injury? None of the above   Prior Functional Level Current Functional Level  Bed Mobility  Independent   Supervision +2  Transfers  Independent   Min A +2   Mobility - Walk/Wheelchair  Independent   Ambulation Min A 100 feet with rolling walker    Upper Body Dressing  Independent   Min A   Lower Body Dressing  Independent   Max A   Grooming  Independent   Set-up, then Min A   Eating/Drinking  Independent   Set-up, then Min A   Toilet Transfer  Independent   Min A +2 for safety    Bladder Continence   Yes   has been voiding since foley was removed 08/11/16   Bowel Management  Continent    08/11/16 continent    Stair Climbing  Independent    Not attempted    Communication  Independent    WFL for basic, flat    Memory  Independent    Impaired    Cooking/Meal Prep  Independent       Housework  Independent     Money Management  unknown     Driving  Independent       Special needs/care consideration BiPAP/CPAP: No CPM: No Continuous Drip IV: No Dialysis: No         Life Vest: No Oxygen:  No Special Bed: No Trach Size: No Wound Vac (area): No      Skin: WDL                          Bowel mgmt: 08/11/16 Bladder mgmt: Foley removed 9/21 with reports of continence    Diabetic mgmt: No  Previous Home Environment Living Arrangements: Spouse/significant other, Children  Lives With: Spouse, Son, Daughter Available Help at Discharge: Family Type of Home: House Home Layout: Two level Alternate Level Stairs-Number of Steps: 12-15 Home Access: Stairs to enter Entrance Stairs-Rails: Left, Right Entrance Stairs-Number of Steps: 5 Bathroom Shower/Tub: Health visitor: Standard Bathroom Accessibility: Yes How Accessible: Accessible via walker Home Care Services: No Additional Comments: May go home with parents   Discharge Living Setting Plans for Discharge Living Setting: Other  (Comment) (May go home with mom) Type of Home at Discharge: House Discharge Home Layout: One level Discharge Home Access: Ramped entrance Discharge Bathroom Shower/Tub: Tub/shower unit, Curtain Discharge Bathroom Toilet: Standard Discharge Bathroom Accessibility: Yes How Accessible: Accessible via walker Does the patient have any problems obtaining your medications?: Yes (Describe) (patient is uninsured)  Social/Family/Support Systems Patient Roles: Parent, Spouse Contact Information: Carleen, Rhue: [phone] Anticipated Caregiver: Herbie [place]: [phone] Anticipated Caregiver's Contact Information: Cristi Loron: [phone] Ability/Limitations of Caregiver: None Caregiver Availability:  24/7 Discharge Plan Discussed with Primary Caregiver: Yes Is Caregiver In Agreement with Plan?: Yes Does Caregiver/Family have Issues with Lodging/Transportation while Pt is in Rehab?: No  Goals/Additional Needs Patient/Family Goal for Rehab: PT/OT/SLP Supervision  Expected length of stay: 12-14 days  Cultural Considerations: None Dietary Needs: Regular textures and thin liquids  Equipment Needs: TBD Special Service Needs: Needs to be [facility] in [facility] Fin[facility] Counselor  Additional Information: Family has requested application for Mediciaid and disability applications Pt/Family Agrees to Admission and willing to participate: Yes Program Orientation Provided & Reviewed with Pt/Caregiver Including Roles  & Responsibilities: Yes Additional Information Needs: See above  Information Needs to be Provided By: CSW and Artistinancial Counselor   Patient Condition: I have reviewed the patient's medical record and spoken with the patient and family over the phone.  They are eager for rehab to facilitate her safe transition home.  Patient has ongoing medical, PT, OT, and SLP needs due to deficits in self-care, balance, memory, and safety awareness.  Given that patient was  an active and independent prior to admission and caring for her children she makes an excellent candidate for an IP Rehab admission.     Preadmission Screen Completed By:  Fae PippinMelissa Bowie, 08/12/2016 8:44 AM ______________________________________________________________________   Discussed status with Dr. Riley KillSwartz on 08/12/16 at 0910 and received telephone approval for admission today.  Admission Coordinator:  Fae PippinMelissa Bowie, time 0910/Date 08/12/16   Assessment/Plan: Diagnosis: anoxic BI 1. Does the need for close, 24 hr/day  Medical supervision in concert with the patient's rehab needs make it unreasonable for this patient to be served in a less intensive setting? Yes 2. Co-Morbidities requiring supervision/potential complications: behavior/cognitive issues 3. Due to bladder management, bowel management, safety, skin/wound care, disease management, medication administration, pain management and patient education, does the patient require 24 hr/day rehab nursing? Yes 4. Does the patient require coordinated care of a physician, rehab nurse, PT (1-2 hrs/day, 5 days/week), OT (1-2 hrs/day, 5 days/week) and SLP (1-2 hrs/day, 5 days/week) to address physical and functional deficits in the context of the above medical diagnosis(es)? Yes Addressing deficits in the following areas: balance, endurance, locomotion, strength, transferring, bowel/bladder control, bathing, dressing, feeding, grooming, toileting, cognition and psychosocial support 5. Can the patient actively participate in an intensive therapy program of at least 3 hrs of therapy 5 days a week? Yes 6. The potential for patient to make measurable gains while on inpatient rehab is excellent 7. Anticipated functional outcomes upon discharge from inpatients are: supervision PT, supervision OT, supervision SLP 8. Estimated rehab length of stay to reach the above functional goals is: 12-16 days 9. Does the patient have adequate social supports to  accommodate these discharge functional goals? Yes 10. Anticipated D/C setting: Home 11. Anticipated post D/C treatments: HH therapy and Outpatient therapy 12. Overall Rehab/Functional Prognosis: excellent    RECOMMENDATIONS: This patient's condition is appropriate for continued rehabilitative care in the following setting: CIR Patient has agreed to participate in recommended program. Yes Note that insurance prior authorization may be required for reimbursement for recommended care.  Comment: admit to inpatient rehab today  Ranelle OysterZachary T. Stephanos Fan, MD, Omega Surgery CenterFAAPMR [facility] 08/12/2016   Fae PippinMelissa Bowie 08/12/2016

## 2016-08-11 NOTE — Progress Notes (Signed)
Physical Therapy Treatment Patient Details Name: Martha GravelJessica Z Applin MRN: [id number] DOB: [birth year] Today's Date: 08/11/2016    History of Present Illness Pt. found unresponsive at home by her husband and admittted for acute respiratory failue +cocaine, intubated 08/05/16 and self extubated 08/08/16.  Pt with acute encephalopathy from cocaine toxicity with neurogenic/cardiogenic shock with severe metabolic acidosis fings concerning for  probable anoxic brain injury, with elevated liver enzymes.  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    PT Comments    Pt in bed, awake, agrees to session.  Participated in supine AROM for BLE"S x 10 with small movements.  She was able to transition to edge of bed with supervision and +2 assist for tubes and equipment.  Stoood and was able to ambulate 100' with rolling walker and +2 assist for equipment and min guard.  Gait with decreased step height and length but no LOB's or buckling noted.  She was fatigued with session.     Follow Up Recommendations  CIR     Equipment Recommendations  Rolling walker with 5" wheels    Recommendations for Other Services       Precautions / Restrictions Precautions Precautions: Fall Restrictions Weight Bearing Restrictions: No    Mobility  Bed Mobility Overal bed mobility: Needs Assistance Bed Mobility: Supine to Sit     Supine to sit: Supervision;+2 for safety/equipment     General bed mobility comments: Did well with mobility today, assist to manage cords/tubes  Transfers Overall transfer level: Needs assistance Equipment used: Rolling walker (2 wheeled) Transfers: Sit to/from Stand Sit to Stand: Min guard;+2 safety/equipment            Ambulation/Gait Ambulation/Gait assistance: Min guard;+2 safety/equipment Ambulation Distance (Feet): 100 Feet Assistive device: Rolling walker (2 wheeled) Gait Pattern/deviations: Step-through pattern   Gait velocity interpretation: Below normal  speed for age/gender General Gait Details: steady gait   Stairs            Wheelchair Mobility    Modified Rankin (Stroke Patients Only)       Balance Overall balance assessment: Needs assistance Sitting-balance support: Feet unsupported;Single extremity supported Sitting balance-Leahy Scale: Fair     Standing balance support: Bilateral upper extremity supported Standing balance-Leahy Scale: Fair                      Cognition Arousal/Alertness: Awake/alert Behavior During Therapy: Flat affect;WFL for tasks assessed/performed Overall Cognitive Status: Within Functional Limits for tasks assessed                      Exercises Low Level/ICU Exercises Ankle Circles/Pumps: AROM;Both;10 reps;Supine Hip ABduction/ADduction: AROM;Both;10 reps;Supine Heel Slides: AROM;Both;10 reps;Supine    General Comments        Pertinent Vitals/Pain Pain Assessment: No/denies pain    Home Living                      Prior Function            PT Goals (current goals can now be found in the care plan section) Progress towards PT goals: Progressing toward goals    Frequency    7X/week      PT Plan Current plan remains appropriate    Co-evaluation             End of Session Equipment Utilized During Treatment: Gait belt Activity Tolerance: Patient tolerated treatment well;Patient limited by fatigue Patient left: in chair;with call bell/phone  within reach     Time: 1100-1125 PT Time Calculation (min) (ACUTE ONLY): 25 min  Charges:  $Gait Training: 8-22 mins $Therapeutic Exercise: 8-22 mins                    G Codes:      Danielle Dess Sep 02, 2016, 11:47 AM

## 2016-08-11 NOTE — Progress Notes (Signed)
[facility] [place] at [facility]   PATIENT NAME: Martha Lawson    MR#:  [id number]  DATE OF BIRTH:  1981/02/15  SUBJECTIVE:  CHIEF COMPLAINT:   Chief Complaint  Patient presents with  . Altered Mental Status   Brought by EMS- unresponsive, drug overdose, intubated and self extubated. Also having MRSA in sputum cx. Since extubation, oxygenation is stable, but mental status Is not back to baseline, and suspected to have anoxic brain injuries.  Drowzy. Slowly improving and better per family Nothing acute overnight  REVIEW OF SYSTEMS:   Because of drowsiness, pt is not able to give further details. ROS  DRUG ALLERGIES:   Allergies  Allergen Reactions  . Aspirin     VITALS:  Blood pressure 125/79, pulse 61, temperature 98.3 F (36.8 C), temperature source Oral, resp. rate 16, height 5\' 5"  (1.651 m), weight 106 kg (233 lb 11 oz), SpO2 94 %.  PHYSICAL EXAMINATION:  GENERAL:  35 y.o.-year-old patient lying in the bed with no acute distress.  EYES: Pupils equal, round, reactive to light . No scleral icterus. Extraocular muscles intact.  HEENT: Head atraumatic, normocephalic. Oropharynx and nasopharynx clear.  NECK:  Supple, no jugular venous distention. No thyroid enlargement, no tenderness.  LUNGS: Normal breath sounds bilaterally, no wheezing, rales,rhonchi or crepitation. No use of accessory muscles of respiration.  CARDIOVASCULAR: S1, S2 normal. No murmurs, rubs, or gallops.  ABDOMEN: Soft, nontender, nondistended. Bowel sounds present. No organomegaly or mass.  EXTREMITIES: No pedal edema, cyanosis, or clubbing.  NEUROLOGIC: pt is drowsy, response to name calling, but keeps eyes closed and moves all 4 extremities symmetrically PSYCHIATRIC: The patient is drowsy.  SKIN: No obvious rash, lesion, or ulcer.   Physical Exam LABORATORY PANEL:   CBC  Recent Labs Lab 08/11/16 0500  WBC 8.7  HGB 10.4*  HCT 29.6*  PLT 128*    ------------------------------------------------------------------------------------------------------------------  Chemistries   Recent Labs Lab 08/11/16 0500 08/11/16 0513  NA  --  139  K  --  3.5  CL  --  106  CO2  --  29  GLUCOSE  --  119*  BUN  --  11  CREATININE  --  0.89  CALCIUM  --  8.0*  MG 1.6*  --   AST 74* 73*  ALT 988* 909*  ALKPHOS 39 37*  BILITOT 1.2 1.2   ------------------------------------------------------------------------------------------------------------------  Cardiac Enzymes  Recent Labs Lab 08/05/16 0736  TROPONINI 0.83*   ------------------------------------------------------------------------------------------------------------------  RADIOLOGY:  No results found.  ASSESSMENT AND PLAN:   Active Problems:   Acute respiratory failure (HCC)   Unresponsive   Metabolic acidosis   Anoxic brain damage (HCC)   Elevated LFTs   Shock liver  * Altered mental status   Most ikely anoxic brain injury   Neurology on board.  * Drug overdose    Monitored Can d/c tele today  * Elevated LFT and shock liver   Improving  * MRSA tracheitis On clindamycin  Will benefit from inpatient rehab    All the records are reviewed and case discussed with Care Management/Social Workerr. Management plans discussed with the patient, family and they are in agreement.  CODE STATUS: full  TOTAL TIME TAKING CARE OF THIS PATIENT: 35 minutes. Discussed with pt's family in room.  Milagros Loll R M.D on 08/11/2016   Between 7am to 6pm - Pager - [phone]  After 6pm go to [email or web address] - Social research officer, government  [facility]  Office  [phone]  CC:  Primary care physician; No PCP Per Patient  Note: This dictation was prepared with Dragon dictation along with smaller phrase technology. Any transcriptional errors that result from this process are unintentional.

## 2016-08-11 NOTE — Progress Notes (Signed)
Speech Language Pathology Treatment: Dysphagia  Patient Details Name: Martha Lawson MRN: [id number] DOB: [birth year] Today's Date: 08/11/2016 Time: 1010-1050 SLP Time Calculation (min) (ACUTE ONLY): 40 min  Assessment / Plan / Recommendation Clinical Impression  Pt seen for diet consistency upgrade today. Pt presented w/ increased alertness and was verbally engaged w/ family members and SLP. She helped to feed self given just tray setup assist. Pt consumed trials of thin liquids via straw and solids w/ no overt s/s of aspiration noted; O2 sats remained in high 90's, vocal quality clear b/t trials. Oral phase appeared wfl for bolus management and clearing.  Pt appears at reduced risk for aspiration of a regular po diet consistency following general aspiration precautions. Recommend diet upgrade, aspiration precautions, Meds in Puree as needed for easier swallowing, assistance w/ tray setup at meals. Recommend reducing distractions during meals to allow pt to focus on task. Pt is more alert and verbally engaged w/ others today but unsure of the extent of any Cognitive issue post unresponsiveness d/t drug overdose and the suspected anoxic brain injury. Recommend f/u w/ full Cognitive assessment as pt's goal is to return home independently.     HPI HPI: Pt is a 35 yo white female admitted to ICU for acute encephalopathy from cocaine toxicity with neurogenic/cardiogenic shock with severe metabolic acidosis fings concerning for seizures and probable anoxic brain injury. Experienced periods of lethargy yesterday but this is improved today. Pt conversing w/ family present in room this morning. Volume of speech low; pt is communicating her wants/needs per NSG report.      SLP Plan  Continue with current plan of care     Recommendations  Diet recommendations: Regular;Thin liquid Liquids provided via: Cup;Straw Medication Administration: Whole meds with puree (as needed) Supervision: Patient able to self  feed;Intermittent supervision to cue for compensatory strategies (setup assistance) Compensations: Minimize environmental distractions;Slow rate;Small sips/bites;Follow solids with liquid Postural Changes and/or Swallow Maneuvers: Seated upright 90 degrees;Upright 30-60 min after meal                General recommendations:  (PT/OT following) Oral Care Recommendations: Oral care BID;Staff/trained caregiver to provide oral care Follow up Recommendations: Outpatient SLP;Inpatient Rehab;24 hour supervision/assistance Plan: Continue with current plan of care       GO                 Martha SomKatherine Watson, MS, CCC-SLP  Lawson,Katherine 08/11/2016, 11:49 AM

## 2016-08-11 NOTE — Care Management (Signed)
Notified Martha PippinMelissa Lawson from inpatient rehab that patient has been assessed by PT and OT and both disciplines have recommended CIR.  Martha states that she will speak with patient's family today regarding discharge to CIR.  Per Martha's request I have contacted patient fiances to see if the Medicaid application as been initiated.  Message left for Amy Virgel BouquetShepard, and Nicholas LoseLatoya Thomas.  Awaiting return call.  Per Martha bed at CIR would be available as early as tomorrow.

## 2016-08-11 NOTE — Progress Notes (Signed)
[facility] Voided x 2 since d/c of foley.

## 2016-08-11 NOTE — H&P (Signed)
Physical Medicine and Rehabilitation Admission H&P    Chief Complaint  Patient presents with  . 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 female with history of polysubstance abuse who was using drugs and was found unresponsive at home on 08/05/16. UDS positive for cocaine, benzos, opiates and amphetamines. Patient foaming at mouth with GCS 3. She was intubated in ED and placed on pressors for neurogenic shock from cocaine toxicity with severe metabolic acidosis.  CT head negative for acute changes and neurology recommended full work up to rule out ABI.  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 Cardiology felt that boderline elevation in troponin likely due to demand ischemia.  Patient self extubated on 9/18 and noted to have foul smelling secretions positive for MRSA--antibiotics adjusted to Vancomycin--> clindamycin.   She continues to have issues with agitation, hallucinations and bouts of lethargy resolving.  Diet advanced to regular as attention and alertness improving. Therapy ongoing and CIR recommended for follow up therapy.    Review of Systems  HENT: Negative for hearing loss and tinnitus.   Eyes: Negative for blurred vision and double vision.  Respiratory: Negative for cough and shortness of breath.   Cardiovascular: Negative for chest pain, palpitations and leg swelling.  Gastrointestinal: Negative for abdominal pain, constipation, heartburn and nausea.  Genitourinary: Negative for dysuria and urgency.  Musculoskeletal: Negative for back pain, joint pain, myalgias and neck pain.  Skin: Negative for itching and rash.  Neurological: Positive for dizziness, focal weakness and weakness. Negative for tingling, speech change, seizures and headaches.  Psychiatric/Behavioral: Positive for substance abuse. Negative for depression and  hallucinations. The patient does not have insomnia.       Past Medical History:  Diagnosis Date  . Asthma     History reviewed. No pertinent surgical history.    Family History  Problem Relation Age of Onset  . High blood pressure Mother   . Diabetes Mother   . Stroke Mother   . High blood pressure Father   . AAA (abdominal aortic aneurysm) Father       Social History:  Married. Has two young children at home. She smokes cigarettes "socially". Husband smokes daily. She does not use smokeless tobacco. She reports she drinks 2 mixed drinks daily.  Per reports that she uses drugs.    Allergies  Allergen Reactions  . Aspirin     No prescriptions prior to admission.    Home: Home Living Family/patient expects to be discharged to:: Private residence Living Arrangements: Spouse/significant other, Children Available Help at Discharge: Family Type of Home: House Home Access: Stairs to enter Technical brewer of Steps: 5 Entrance Stairs-Rails: Left, Right Home Layout: Two level Alternate Level Stairs-Number of Steps: 12-15 Bathroom Shower/Tub: Multimedia programmer: Standard Bathroom Accessibility: Yes Home Equipment: None Additional Comments: May go home with parents   Lives With: Spouse, Son, Daughter   Functional History: Prior Function Level of Independence: Independent Comments: Pt. performed all ADL's independently prior to admission, married and has 2 children ages 2 and 77 (65rd and 4th grade).  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  Functional Status:  Mobility: Bed Mobility Overal bed mobility: Needs Assistance Bed Mobility: Supine to Sit Supine to sit: Supervision, +2 for safety/equipment General bed mobility comments: Did well with mobility today,  assist to manage cords/tubes Transfers Overall transfer level: Needs assistance Equipment used: Rolling walker (2 wheeled) Transfers: Sit to/from  Stand Sit to Stand: Min guard, +2 safety/equipment General transfer comment: Pt. able to stand to RW with one UE pushing from EOB.  Ambulation/Gait Ambulation/Gait assistance: Min guard, +2 safety/equipment Ambulation Distance (Feet): 100 Feet Assistive device: Rolling walker (2 wheeled) Gait Pattern/deviations: Step-through pattern General Gait Details: steady gait Gait velocity interpretation: Below normal speed for age/gender    ADL: ADL Overall ADL's : Needs assistance/impaired Eating/Feeding: Set up, Minimal assistance, Bed level Grooming: Wash/dry hands, Wash/dry face, Oral care, Applying deodorant, Brushing hair, Minimal assistance, Set up, Cueing for sequencing Grooming Details (indicate cue type and reason): delayed responses but able to follow steps to complete automatic tasks; limited due to lethargy and first time sitting up in chair Upper Body Dressing : Set up, Minimal assistance Upper Body Dressing Details (indicate cue type and reason): extra time needed Lower Body Dressing: Maximal assistance Lower Body Dressing Details (indicate cue type and reason): pt fatigued sitting up in recliner and not able to sit forward on her own to reach feet and not able to keep eyes open when trying to lean foward  General ADL Comments: today was first time patient was alert enough to transfer from bed to recliner with PT and then work with OT.  Cognition: Cognition Overall Cognitive Status: Impaired/Different from baseline Orientation Level: Oriented X4 Cognition Arousal/Alertness: Awake/alert Behavior During Therapy: Flat affect, WFL for tasks assessed/performed Overall Cognitive Status: Impaired/Different from baseline   Blood pressure (!) 154/103, pulse 71, temperature 98.2 F (36.8 C), temperature source Oral, resp. rate 16, height 5' 5" (1.651 m), weight 103.8 kg (228 lb 12.8 oz), SpO2 97 %. Physical Exam  Nursing note and vitals reviewed. Constitutional: She is oriented to  person, place, and time. She appears well-developed and well-nourished.  obese  HENT:  Head: Normocephalic and atraumatic.  Eyes: Conjunctivae are normal. Pupils are equal, round, and reactive to light.  Neck: Normal range of motion. Neck supple.  Cardiovascular: Normal rate and regular rhythm.   No murmur heard. Respiratory: Effort normal and breath sounds normal. No respiratory distress. She has no wheezes.  GI: Soft. Bowel sounds are normal. She exhibits no distension. There is no tenderness.  Musculoskeletal: Normal range of motion. She exhibits edema (min edema bilateral hands. ). She exhibits no deformity.  Neurological: She is alert and oriented to person, place, and time. No cranial nerve deficit.  Drowsy, having a hard time staying awake. Moves all 4's. Senses pain. Could tell me she was in the hospital because she overdosed on marijuana and cocaine. Follows simple one step commands while awake. Saw no obvious CN abnormalities.    Skin: Skin is warm and dry.  Psychiatric: Her speech is normal. Her affect is blunt. She is slowed. She is inattentive.    Results for orders placed or performed during the hospital encounter of 08/05/16 (from the past 48 hour(s))  Glucose, capillary     Status: Abnormal   Collection Time: 08/10/16  4:37 PM  Result Value Ref Range   Glucose-Capillary 122 (H) 65 - 99 mg/dL  Glucose, capillary     Status: Abnormal   Collection Time: 08/10/16  7:33 PM  Result Value Ref Range   Glucose-Capillary 130 (H) 65 - 99 mg/dL   Comment 1 Notify RN   Glucose, capillary     Status: Abnormal   Collection Time: 08/10/16 11:58 PM  Result Value Ref Range     Glucose-Capillary 101 (H) 65 - 99 mg/dL  Glucose, capillary     Status: None   Collection Time: 08/11/16  3:45 AM  Result Value Ref Range   Glucose-Capillary 74 65 - 99 mg/dL   Comment 1 Notify RN   Magnesium in AM     Status: Abnormal   Collection Time: 08/11/16  5:00 AM  Result Value Ref Range   Magnesium  1.6 (L) 1.7 - 2.4 mg/dL  Hepatic function panel     Status: Abnormal   Collection Time: 08/11/16  5:00 AM  Result Value Ref Range   Total Protein 5.8 (L) 6.5 - 8.1 g/dL   Albumin 2.8 (L) 3.5 - 5.0 g/dL   AST 74 (H) 15 - 41 U/L   ALT 988 (H) 14 - 54 U/L   Alkaline Phosphatase 39 38 - 126 U/L   Total Bilirubin 1.2 0.3 - 1.2 mg/dL   Bilirubin, Direct 0.3 0.1 - 0.5 mg/dL   Indirect Bilirubin 0.9 0.3 - 0.9 mg/dL  CBC     Status: Abnormal   Collection Time: 08/11/16  5:00 AM  Result Value Ref Range   WBC 8.7 3.6 - 11.0 K/uL   RBC 3.23 (L) 3.80 - 5.20 MIL/uL   Hemoglobin 10.4 (L) 12.0 - 16.0 g/dL   HCT 29.6 (L) 35.0 - 47.0 %   MCV 91.5 80.0 - 100.0 fL   MCH 32.1 26.0 - 34.0 pg   MCHC 35.0 32.0 - 36.0 g/dL   RDW 13.3 11.5 - 14.5 %   Platelets 128 (L) 150 - 440 K/uL  Comprehensive metabolic panel     Status: Abnormal   Collection Time: 08/11/16  5:13 AM  Result Value Ref Range   Sodium 139 135 - 145 mmol/L   Potassium 3.5 3.5 - 5.1 mmol/L   Chloride 106 101 - 111 mmol/L   CO2 29 22 - 32 mmol/L   Glucose, Bld 119 (H) 65 - 99 mg/dL   BUN 11 6 - 20 mg/dL   Creatinine, Ser 0.89 0.44 - 1.00 mg/dL   Calcium 8.0 (L) 8.9 - 10.3 mg/dL   Total Protein 5.7 (L) 6.5 - 8.1 g/dL   Albumin 2.7 (L) 3.5 - 5.0 g/dL   AST 73 (H) 15 - 41 U/L   ALT 909 (H) 14 - 54 U/L   Alkaline Phosphatase 37 (L) 38 - 126 U/L   Total Bilirubin 1.2 0.3 - 1.2 mg/dL   GFR calc non Af Amer >60 >60 mL/min   GFR calc Af Amer >60 >60 mL/min    Comment: (NOTE) The eGFR has been calculated using the CKD EPI equation. This calculation has not been validated in all clinical situations. eGFR's persistently <60 mL/min signify possible Chronic Kidney Disease.    Anion gap 4 (L) 5 - 15  Glucose, capillary     Status: Abnormal   Collection Time: 08/11/16  7:13 AM  Result Value Ref Range   Glucose-Capillary 104 (H) 65 - 99 mg/dL  Glucose, capillary     Status: Abnormal   Collection Time: 08/11/16 11:30 AM  Result Value Ref  Range   Glucose-Capillary 122 (H) 65 - 99 mg/dL  Ammonia     Status: None   Collection Time: 08/11/16 12:10 PM  Result Value Ref Range   Ammonia 20 9 - 35 umol/L  Glucose, capillary     Status: Abnormal   Collection Time: 08/11/16  8:43 PM  Result Value Ref Range   Glucose-Capillary 103 (H) 65 - 99  mg/dL  Glucose, capillary     Status: None   Collection Time: 08/12/16 12:01 AM  Result Value Ref Range   Glucose-Capillary 70 65 - 99 mg/dL  Glucose, capillary     Status: None   Collection Time: 08/12/16  3:48 AM  Result Value Ref Range   Glucose-Capillary 92 65 - 99 mg/dL  Basic metabolic panel     Status: Abnormal   Collection Time: 08/12/16  5:12 AM  Result Value Ref Range   Sodium 137 135 - 145 mmol/L   Potassium 3.4 (L) 3.5 - 5.1 mmol/L   Chloride 103 101 - 111 mmol/L   CO2 29 22 - 32 mmol/L   Glucose, Bld 90 65 - 99 mg/dL   BUN 11 6 - 20 mg/dL   Creatinine, Ser 0.93 0.44 - 1.00 mg/dL   Calcium 8.3 (L) 8.9 - 10.3 mg/dL   GFR calc non Af Amer >60 >60 mL/min   GFR calc Af Amer >60 >60 mL/min    Comment: (NOTE) The eGFR has been calculated using the CKD EPI equation. This calculation has not been validated in all clinical situations. eGFR's persistently <60 mL/min signify possible Chronic Kidney Disease.    Anion gap 5 5 - 15  Magnesium     Status: None   Collection Time: 08/12/16  5:12 AM  Result Value Ref Range   Magnesium 1.7 1.7 - 2.4 mg/dL  Phosphorus     Status: None   Collection Time: 08/12/16  5:12 AM  Result Value Ref Range   Phosphorus 4.1 2.5 - 4.6 mg/dL  Glucose, capillary     Status: None   Collection Time: 08/12/16  7:54 AM  Result Value Ref Range   Glucose-Capillary 91 65 - 99 mg/dL   No results found.     Medical Problem List and Plan: 1.  Cognitive, mobility and functional deficits secondary to anoxic brain injury  -admit to inpatient rehab 2.  DVT Prophylaxis/Anticoagulation: Pharmaceutical: Lovenox 3. Pain Management: tylenol prn 4. Mood:  LCSW to follow for evaluation and support.  5. Neuropsych: This patient is not capable of making decisions on her own behalf.  -re-establish sleep wake cycle 6. Skin/Wound Care: routine pressure relief measures. 7. Fluids/Electrolytes/Nutrition: Monitor I/O. Intake improving. Check lytes in am. 8. Hypokalemia: Will supplement today.  Recheck labs in am.  10. Abnormal LFTs: Due to shocked liver--slowly resolving. HepA/Hep B negative. Ammonia levels WNL.  Will recheck LFTs  in am.  11. MRSA tracheitis: On clindamycin for 4 more days.  12.  Polysubstance abuse: educate on cessation/ refer to counseling.      Post Admission Physician Evaluation: 1. Functional deficits secondary  to anoxic brain injury. 2. Patient is admitted to receive collaborative, interdisciplinary care between the physiatrist, rehab nursing staff, and therapy team. 3. Patient's level of medical complexity and substantial therapy needs in context of that medical necessity cannot be provided at a lesser intensity of care such as a SNF. 4. Patient has experienced substantial functional loss from his/her baseline which was documented above under the "Functional History" and "Functional Status" headings.  Judging by the patient's diagnosis, physical exam, and functional history, the patient has potential for functional progress which will result in measurable gains while on inpatient rehab.  These gains will be of substantial and practical use upon discharge  in facilitating mobility and self-care at the household level. 5. Physiatrist will provide 24 hour management of medical needs as well as oversight of the therapy plan/treatment and provide  guidance as appropriate regarding the interaction of the two. 6. 24 hour rehab nursing will assist with bladder management, bowel management, safety, skin/wound care, disease management, medication administration, pain management and patient education  and help integrate therapy concepts,  techniques,education, etc. 7. PT will assess and treat for/with: Lower extremity strength, range of motion, stamina, balance, functional mobility, safety, adaptive techniques and equipment, cognitive perceptual awareness, family education.   Goals are: supervision to mod I. 8. OT will assess and treat for/with: ADL's, functional mobility, safety, upper extremity strength, adaptive techniques and equipment, NMR, cognitive perceptual awareness, ego support, family education.   Goals are: supervision to mod I. Therapy may proceed with showering this patient. 9. SLP will assess and treat for/with: cognition, education, communication.  Goals are: supervision to min assist. 10. Case Management and Social Worker will assess and treat for psychological issues and discharge planning. 11. Team conference will be held weekly to assess progress toward goals and to determine barriers to discharge. 12. Patient will receive at least 3 hours of therapy per day at least 5 days per week. 13. ELOS: 11-16 days       14. Prognosis:  good     Meredith Staggers, MD, [facility] 08/12/2016  08/12/2016

## 2016-08-12 ENCOUNTER — Encounter: Payer: Self-pay | Admitting: Physical Medicine and Rehabilitation

## 2016-08-12 ENCOUNTER — Inpatient Hospital Stay (HOSPITAL_COMMUNITY)
Admission: RE | Admit: 2016-08-12 | Discharge: 2016-08-18 | DRG: 091 | Disposition: A | Discharge status: Home / Self Care | Payer: Medicaid Other | Source: Other Acute Inpatient Hospital | Attending: Physical Medicine & Rehabilitation | Admitting: Physical Medicine & Rehabilitation

## 2016-08-12 ENCOUNTER — Encounter: Attending: Family | Primary: Registered Nurse

## 2016-08-12 ENCOUNTER — Ambulatory Visit
Admit: 2016-08-12 | Discharge: 2016-08-12 | Payer: PRIVATE HEALTH INSURANCE | Attending: Family | Primary: Registered Nurse

## 2016-08-12 DIAGNOSIS — F1310 Sedative, hypnotic or anxiolytic abuse, uncomplicated: Secondary | ICD-10-CM

## 2016-08-12 DIAGNOSIS — F17210 Nicotine dependence, cigarettes, uncomplicated: Secondary | ICD-10-CM | POA: Diagnosis not present

## 2016-08-12 DIAGNOSIS — B9562 Methicillin resistant Staphylococcus aureus infection as the cause of diseases classified elsewhere: Secondary | ICD-10-CM

## 2016-08-12 DIAGNOSIS — G931 Anoxic brain damage, not elsewhere classified: Secondary | ICD-10-CM | POA: Diagnosis present

## 2016-08-12 DIAGNOSIS — K7200 Acute and subacute hepatic failure without coma: Secondary | ICD-10-CM | POA: Diagnosis present

## 2016-08-12 DIAGNOSIS — R41840 Attention and concentration deficit: Secondary | ICD-10-CM

## 2016-08-12 DIAGNOSIS — K759 Inflammatory liver disease, unspecified: Secondary | ICD-10-CM

## 2016-08-12 DIAGNOSIS — F1110 Opioid abuse, uncomplicated: Secondary | ICD-10-CM | POA: Diagnosis not present

## 2016-08-12 DIAGNOSIS — F419 Anxiety disorder, unspecified: Secondary | ICD-10-CM | POA: Diagnosis not present

## 2016-08-12 DIAGNOSIS — E876 Hypokalemia: Secondary | ICD-10-CM

## 2016-08-12 DIAGNOSIS — J0410 Acute tracheitis without obstruction: Secondary | ICD-10-CM | POA: Diagnosis not present

## 2016-08-12 DIAGNOSIS — F1410 Cocaine abuse, uncomplicated: Secondary | ICD-10-CM | POA: Diagnosis not present

## 2016-08-12 DIAGNOSIS — F1510 Other stimulant abuse, uncomplicated: Secondary | ICD-10-CM | POA: Diagnosis not present

## 2016-08-12 DIAGNOSIS — F1910 Other psychoactive substance abuse, uncomplicated: Secondary | ICD-10-CM

## 2016-08-12 DIAGNOSIS — D62 Acute posthemorrhagic anemia: Secondary | ICD-10-CM

## 2016-08-12 LAB — BASIC METABOLIC PANEL
Anion gap: 5 (ref 5–15)
BUN: 11 mg/dL (ref 6–20)
CHLORIDE: 103 mmol/L (ref 101–111)
CO2: 29 mmol/L (ref 22–32)
CREATININE: 0.93 mg/dL (ref 0.44–1.00)
Calcium: 8.3 mg/dL — ABNORMAL LOW (ref 8.9–10.3)
GFR calc Af Amer: >60 mL/min (ref >60)
GFR calc non Af Amer: >60 mL/min (ref >60)
Glucose, Bld: 90 mg/dL (ref 65–99)
POTASSIUM: 3.4 mmol/L — AB (ref 3.5–5.1)
Sodium: 137 mmol/L (ref 135–145)

## 2016-08-12 LAB — CULTURE, BLOOD (ROUTINE X 2)

## 2016-08-12 LAB — GLUCOSE, CAPILLARY
GLUCOSE-CAPILLARY: 91 mg/dL (ref 65–99)
Glucose-Capillary: 92 mg/dL (ref 65–99)

## 2016-08-12 LAB — PHOSPHORUS: Phosphorus: 4.1 mg/dL (ref 2.5–4.6)

## 2016-08-12 LAB — MAGNESIUM: MAGNESIUM: 1.7 mg/dL (ref 1.7–2.4)

## 2016-08-12 MED ORDER — MUPIROCIN 2 % EX OINT
1.0000 "application " | TOPICAL_OINTMENT | Freq: Two times a day (BID) | CUTANEOUS | Status: AC
  Administered 2016-08-12: 1 via NASAL
  Filled 2016-08-12: qty 22

## 2016-08-12 MED ORDER — DIPHENHYDRAMINE HCL 12.5 MG/5ML PO ELIX: 12.5000 mg | ORAL_SOLUTION | Freq: Four times a day (QID) | ORAL | Status: DC | PRN

## 2016-08-12 MED ORDER — ACETAMINOPHEN 325 MG PO TABS
325.0000 mg | ORAL_TABLET | ORAL | Status: DC | PRN
Start: 2016-08-12 — End: 2016-08-18

## 2016-08-12 MED ORDER — FLEET ENEMA 7-19 GM/118ML RE ENEM: 1.0000 | ENEMA | Freq: Once | RECTAL | Status: DC | PRN

## 2016-08-12 MED ORDER — ENOXAPARIN SODIUM 40 MG/0.4ML ~~LOC~~ SOLN
40.0000 mg | SUBCUTANEOUS | Status: DC
  Administered 2016-08-12 – 2016-08-17 (×6): 40 mg via SUBCUTANEOUS
  Filled 2016-08-12 (×7): qty 0.4

## 2016-08-12 MED ORDER — SODIUM CHLORIDE 0.9% FLUSH: 10.0000 mL | INTRAVENOUS | Status: DC | PRN

## 2016-08-12 MED ORDER — TRAZODONE HCL 50 MG PO TABS: 25.0000 mg | ORAL_TABLET | Freq: Every evening | ORAL | Status: DC | PRN

## 2016-08-12 MED ORDER — ALUM & MAG HYDROXIDE-SIMETH 200-200-20 MG/5ML PO SUSP: 30.0000 mL | ORAL | Status: DC | PRN

## 2016-08-12 MED ORDER — POTASSIUM CHLORIDE CRYS ER 10 MEQ PO TBCR
10.0000 meq | EXTENDED_RELEASE_TABLET | Freq: Two times a day (BID) | ORAL | Status: DC
  Administered 2016-08-12 – 2016-08-18 (×13): 10 meq via ORAL
  Filled 2016-08-12 (×13): qty 1

## 2016-08-12 MED ORDER — PROCHLORPERAZINE MALEATE 5 MG PO TABS: 5.0000 mg | ORAL_TABLET | Freq: Four times a day (QID) | ORAL | Status: DC | PRN

## 2016-08-12 MED ORDER — PROCHLORPERAZINE 25 MG RE SUPP: 12.5000 mg | Freq: Four times a day (QID) | RECTAL | Status: DC | PRN

## 2016-08-12 MED ORDER — CLINDAMYCIN HCL 300 MG PO CAPS
300.0000 mg | ORAL_CAPSULE | Freq: Four times a day (QID) | ORAL | Status: AC
  Administered 2016-08-12 – 2016-08-16 (×16): 300 mg via ORAL
  Filled 2016-08-12 (×17): qty 1

## 2016-08-12 MED ORDER — SENNOSIDES-DOCUSATE SODIUM 8.6-50 MG PO TABS: 1.0000 | ORAL_TABLET | Freq: Every evening | ORAL | Status: DC | PRN

## 2016-08-12 MED ORDER — BISACODYL 10 MG RE SUPP: 10.0000 mg | Freq: Every day | RECTAL | Status: DC | PRN

## 2016-08-12 MED ORDER — GUAIFENESIN-DM 100-10 MG/5ML PO SYRP: 5.0000 mL | ORAL_SOLUTION | Freq: Four times a day (QID) | ORAL | Status: DC | PRN

## 2016-08-12 MED ORDER — SODIUM CHLORIDE 0.9% FLUSH
10.0000 mL | Freq: Two times a day (BID) | INTRAVENOUS | Status: DC
Start: 2016-08-12 — End: 2016-08-16

## 2016-08-12 MED ORDER — POTASSIUM CHLORIDE 20 MEQ PO PACK
40.0000 meq | PACK | Freq: Once | ORAL | Status: AC
  Administered 2016-08-12: 40 meq via ORAL
  Filled 2016-08-12: qty 2

## 2016-08-12 MED ORDER — PROCHLORPERAZINE EDISYLATE 5 MG/ML IJ SOLN
5.0000 mg | Freq: Four times a day (QID) | INTRAMUSCULAR | Status: DC | PRN
Start: 2016-08-12 — End: 2016-08-18

## 2016-08-12 NOTE — Progress Notes (Signed)
Patient dines out to a sit down restaurant 6 times per month.    Patient eats fast food meals 3 times per month.      Drinks mostly Water    24 hour recall/food frequency chart:  Breakfast: Premier protein shake   Snack: none  Lunch: raw vegetables, [place] wrap   Snack: none  Dinner: Financial trader: hand full of goldfish crackers     Total daily calories: 1200 daily       Exercises by cardio & weight lifting weights  40 min 3  Times per week.

## 2016-08-12 NOTE — Progress Notes (Signed)
BARIATRIC SURGERY OFFICE NOTE    SUBJECTIVE:    Patient presenting today referred from [name], for   Chief Complaint   Patient presents with   ??? Weight Management     4th WM visit   .    Vitals:    08/12/16 1257   BP: 117/69   Pulse: 80        BMI: Body mass index is 47.39 kg/(m^2). Obesity Classification: III Morbid Obesity.    Weight History:   Wt Readings from Last 3 Encounters:   08/12/16 259 lb 1.6 oz (117.5 kg)   07/13/16 268 lb (121.6 kg)   07/11/16 268 lb 14.4 oz (122 kg)       HPI: Linda Blevins is a 35 y.o. female presenting in fourth bariatric visit, follow up diet and exercise - pre-operative weight loss, in consideration for bariatric surgery.    Total weight loss/gain -27 Lbs over 4 month.      Patient dines out to a sit down restaurant 6 times per month.    Patient eats fast food meals 3 times per month.      Drinks mostly Water    24 hour recall/food frequency chart:  Breakfast: Premier protein shake   Snack: none  Lunch: raw vegetables, [place] wrap   Snack: none  Dinner: Financial trader: hand full of goldfish crackers     Total daily calories: 1200 daily     Continues to count calories daily.   Not skipping meals and getting very good protein intake.   Very motivated.   GERD has improved.   Needs psych clearance, saw a week ago- depression controlled at this time on Wellbutrin.       Exercises by cardio & weight lifting weights  40 min 3  Times per week.    Thoroughly reviewed the patient's medical history, family history, social history and review of systems with the patient today in the office.  Please see medical record for pertinent positives.      Past Medical History:   Diagnosis Date   ??? Anxiety    ??? Depression    ??? Fatigue    ??? H/O hysterectomy for benign disease 2012    endometriosis   ??? Obesity       Patient Active Problem List   Diagnosis   ??? Anxiety   ??? Reactive depression   ??? Gastroesophageal reflux disease without esophagitis   ??? SUI (stress urinary incontinence, female)    ??? Snores   ??? Arthritis   ??? Primary stabbing headache   ??? Dyslipidemia     Past Surgical History:   Procedure Laterality Date   ??? ADENOIDECTOMY     ??? HYSTERECTOMY     ??? TONSILLECTOMY       Current Outpatient Prescriptions   Medication Sig Dispense Refill   ??? loratadine (CLARITIN) 10 MG capsule Take 10 mg by mouth daily     ??? Multiple Vitamins-Minerals (THERAPEUTIC MULTIVITAMIN-MINERALS) tablet Take 1 tablet by mouth daily     ??? buPROPion (WELLBUTRIN XL) 300 MG extended release tablet Take 1 tablet by mouth every morning 90 tablet 4   ??? buPROPion (WELLBUTRIN XL) 300 MG extended release tablet Take 1 tablet by mouth every morning 30 tablet 3   ??? Cholecalciferol (VITAMIN D) 2000 UNITS TABS tablet Take 1 tablet by mouth daily 30 tablet 11     No current facility-administered medications for this visit.  Allergies   Allergen Reactions   ??? Ciprofloxacin    ??? Pcn [Penicillins] Hives   ??? Sulfa Antibiotics Hives   ??? Morphine And Related Nausea And Vomiting       Review of Systems   Constitutional: Negative.  Negative for appetite change, fatigue and fever.   HENT: Negative for congestion, dental problem, hearing loss, rhinorrhea and trouble swallowing.    Eyes: Negative for pain.   Respiratory: Negative for chest tightness, shortness of breath and wheezing.    Cardiovascular: Negative for chest pain, palpitations and leg swelling.   Gastrointestinal: Negative for abdominal distention, abdominal pain, diarrhea and nausea.        +GERD, improved.    Endocrine: Negative for cold intolerance and polydipsia.   Genitourinary: Negative for difficulty urinating and frequency.   Musculoskeletal: Positive for arthralgias, back pain and myalgias. Negative for gait problem.   Skin: Negative for rash.   Allergic/Immunologic: Negative for environmental allergies.   Neurological: Negative for dizziness, seizures and syncope.   Hematological: Does not bruise/bleed easily.   Psychiatric/Behavioral: Negative for behavioral problems and  suicidal ideas.       OBJECTIVE:    BP 117/69 (Site: Left Arm, Position: Sitting, Cuff Size: Large Adult)   Pulse 80   Ht 5\' 2"  (1.575 m)   Wt 259 lb 1.6 oz (117.5 kg)   Breastfeeding? No   BMI 47.39 kg/m2     Physical Exam   Constitutional: She is oriented to person, place, and time. She appears well-developed and well-nourished.   Obese   HENT:   Head: Normocephalic and atraumatic.   Right Ear: Hearing and ear canal normal.   Left Ear: Hearing and ear canal normal.   Nose: Nose normal.   Mouth/Throat: Uvula is midline and oropharynx is clear and moist.   Eyes: Conjunctivae are normal. Pupils are equal, round, and reactive to light.   Neck: Normal range of motion.   Cardiovascular: Normal rate, regular rhythm and normal heart sounds.    Pulmonary/Chest: Effort normal and breath sounds normal. She has no decreased breath sounds. She has no wheezes. She has no rhonchi. She has no rales.   Abdominal: Soft. Bowel sounds are normal. There is no tenderness.   Musculoskeletal: Normal range of motion. She exhibits no tenderness.   In all 4 extremities.    Neurological: She is alert and oriented to person, place, and time. She has normal strength. She exhibits normal muscle tone. GCS eye subscore is 4. GCS verbal subscore is 5. GCS motor subscore is 6.   Skin: Skin is warm and dry. No rash noted.   Psychiatric: She has a normal mood and affect. Her behavior is normal. Judgment normal.   Nursing note and vitals reviewed.      ASSESSMENT & PLAN:    1. Morbid obesity with BMI of 45.0-49.9, adult (HCC)  - Doing very well with weight loss.   - Follow up in 1 month with Dr. Ward Givens.   - Will need EGD consent and surgery consent.     2. Gastroesophageal reflux disease without esophagitis  - GERD, has improved.   - Continue to watch diet restrictions associated.   - Will be further evaluated with EGD in the future.   - Follow up with any worsening symptoms.     3. Dyslipidemia  - LDL elevated in the past, has improved to 105.   -  Will continue to monitor.   - Managed per PCP.  4. Arthritis  - Persistent and limiting but continues to improve.   - Encouraged weight loss and diet.   - Follow up with PCP for any worsening symptoms.     5. Reactive depression  - Wellbutrin 300 mg daily, stable.   - Needs to continue and management per PCP.   - Continue weight loss and diet.   - No hx of SI/HI.     6. RUQ pain  - GB needs evaluated.   - US Gallbladder Ruq; Future       Patient was encouraged to continue journaling and counting calories- water intake should be 64oz-128oz daily. Beverages to be calorie free except for milk and avoid soda. Continue to increase level of physical activity.    I spent 25 minutes with the patient face to face today and over 50% of the office visit today was spent in face to face counseling regarding diet and exercise and liver shrinking slim fast diet in preparation for her planned Robotic Sleeve Gastrectomy. Also discussed in detail the diet stages following surgery and importance. Encouraged to look through NP binder.     Discussed in length her compliance with work up, need [employer] and Water quality scientistpsych clearance letters.     The patient expressed understanding and willingness to comply nicely; all questions and concerns addressed.    No orders of the defined types were placed in this encounter.    Orders Placed This Encounter   Procedures   ??? US Gallbladder Ruq     Standing Status:   Future     Standing Expiration Date:   08/12/2017     Order Specific Question:   Reason for exam:     Answer:   RUQ pain.       Follow Up:  Return in about 1 month (around 09/11/2016).    Camelia PhenesKAYLENE A BAKER, CNP

## 2016-08-12 NOTE — Discharge Summary (Signed)
[facility] - [facility]   PATIENT NAME: Martha Lawson    MR#:  [id number]  DATE OF BIRTH:  January 26, 1981  DATE OF ADMISSION:  08/05/2016 ADMITTING PHYSICIAN: Erin Fulling, MD  DATE OF DISCHARGE: No discharge date for patient encounter.  PRIMARY CARE PHYSICIAN: No PCP Per Patient   ADMISSION DIAGNOSIS:  Hypocalcemia [E83.51] Metabolic acidosis [E87.2] Elevated troponin I level [R79.89] Systemic inflammatory response syndrome of infectious origin with acute organ failure (HCC) [A41.9, R65.20] Severe sepsis with septic shock (CODE) (HCC) [R65.21]  DISCHARGE DIAGNOSIS:  Active Problems:   Acute respiratory failure (HCC)   Unresponsive   Metabolic acidosis   Anoxic brain damage (HCC)   Elevated LFTs   Shock liver   SECONDARY DIAGNOSIS:   Past Medical History:  Diagnosis Date  . Asthma      ADMITTING HISTORY  HISTORY OF PRESENT ILLNESS by Dr. Belia Heman  35 yo white female admitted to ICU for acute mental status changes -patient found unresponsive at home by husband, patient was using drugs +cocaine  Patient was foaming at the mouth, gcs<4 Patient with resp failure Patient was intubated in ER placed on Vent CVl placed, placed on vasopressors  Patient remains comatosed  HOSPITAL COURSE:   Patient was admitted to ICU after being found unresponsive by husband. Urine drug screen was positive for cocaine. Patient was found foaming of the mouth and likely had seizure. Intubated due to respiratory failure. Central line placed and vasopressors used for hypertension.  Transferred out of the ICU to the hospitalist service by critical care specialist.  * Acute toxic metabolic encephalopathy due to drug abuse    This seems to have resulted in anoxic brain injury. Seen by neurology Dr. Thad Ranger.    MRI of the brain showed no mass or stroke. Changes of anoxic brain injury found.  * Drug overdose - resolved    Monitored  * Elevated LFT due  to shock liver    improving well. Needs repeat liver function tests in 1 week.  * MRSA tracheitis Chest x-ray showed no pneumonia. Seen by pulmonary. Treated initially with IV vancomycin. Now On clindamycin. Continue clindamycin for 4 more days orally 300 mg every 8 hours.  Patient needs inpatient rehabilitation and will be transferred to [facility] for this.  Discussed with patient and family on day of transfer and answered all questions.  CONSULTS OBTAINED:  Treatment Team:  Marcina Millard, MD Pauletta Browns, MD  DRUG ALLERGIES:   Allergies  Allergen Reactions  . Aspirin     DISCHARGE MEDICATIONS:  There are no discharge medications for this patient.   Today   VITAL SIGNS:  Blood pressure 132/81, pulse 77, temperature 98.2 F (36.8 C), temperature source Oral, resp. rate 16, height 5\' 5"  (1.651 m), weight 103.8 kg (228 lb 12.8 oz), SpO2 100 %.  I/O:   Intake/Output Summary (Last 24 hours) at 08/12/16 0953 Last data filed at 08/12/16 0539  Gross per 24 hour  Intake                0 ml  Output              555 ml  Net             -555 ml    PHYSICAL EXAMINATION:  Physical Exam  GENERAL:  35 y.o.-year-old patient lying in the bed with no acute distress.  LUNGS: Normal breath sounds bilaterally, no wheezing, rales,rhonchi or crepitation. No use of accessory  muscles of respiration.  CARDIOVASCULAR: S1, S2 normal. No murmurs, rubs, or gallops.  ABDOMEN: Soft, non-tender, non-distended. Bowel sounds present. No organomegaly or mass.  NEUROLOGIC: Moves all 4 extremities. PSYCHIATRIC: The patient is alert and oriented x 3.  SKIN: No obvious rash, lesion, or ulcer.   DATA REVIEW:   CBC  Recent Labs Lab 08/11/16 0500  WBC 8.7  HGB 10.4*  HCT 29.6*  PLT 128*    Chemistries   Recent Labs Lab 08/11/16 0513 08/12/16 0512  NA 139 137  K 3.5 3.4*  CL 106 103  CO2 29 29  GLUCOSE 119* 90  BUN 11 11  CREATININE 0.89 0.93  CALCIUM 8.0*  8.3*  MG  --  1.7  AST 73*  --   ALT 909*  --   ALKPHOS 37*  --   BILITOT 1.2  --     Cardiac Enzymes No results for input(s): TROPONINI in the last 168 hours.  Microbiology Results  Results for orders placed or performed during the hospital encounter of 08/05/16  Blood culture (routine x 2)     Status: None   Collection Time: 08/05/16  9:12 AM  Result Value Ref Range Status   Specimen Description BLOOD CENTRAL LINE  Final   Special Requests BOTTLES DRAWN AEROBIC AND ANAEROBIC  4CC  Final   Culture NO GROWTH 5 DAYS  Final   Report Status 08/10/2016 FINAL  Final  Blood culture (routine x 2)     Status: None (Preliminary result)   Collection Time: 08/05/16  9:12 AM  Result Value Ref Range Status   Specimen Description BLOOD RIGHT ARM  Final   Special Requests BOTTLES DRAWN AEROBIC AND ANAEROBIC  5CC  Final   Culture  Setup Time   Final    GRAM POSITIVE RODS CRITICAL RESULT CALLED TO, READ BACK BY AND VERIFIED WITH: HANK ZOMPA 08/10/16 1030 SGD ANAEROBIC BOTTLE ONLY [facility]    Culture GRAM POSITIVE RODS  Final   Report Status PENDING  Incomplete  Blood Culture ID Panel (Reflexed)     Status: None   Collection Time: 08/05/16  9:12 AM  Result Value Ref Range Status   Enterococcus species NOT DETECTED NOT DETECTED Final   Vancomycin resistance NOT DETECTED NOT DETECTED Final   Listeria monocytogenes NOT DETECTED NOT DETECTED Final   Staphylococcus species NOT DETECTED NOT DETECTED Final   Staphylococcus aureus NOT DETECTED NOT DETECTED Final   Methicillin resistance NOT DETECTED NOT DETECTED Final   Streptococcus species NOT DETECTED NOT DETECTED Final   Streptococcus agalactiae NOT DETECTED NOT DETECTED Final   Streptococcus pneumoniae NOT DETECTED NOT DETECTED Final   Streptococcus pyogenes NOT DETECTED NOT DETECTED Final   Acinetobacter baumannii NOT DETECTED NOT DETECTED Final   Enterobacteriaceae species NOT DETECTED NOT DETECTED Final    Enterobacter cloacae complex NOT DETECTED NOT DETECTED Final   Escherichia coli NOT DETECTED NOT DETECTED Final   Klebsiella oxytoca NOT DETECTED NOT DETECTED Final   Klebsiella pneumoniae NOT DETECTED NOT DETECTED Final   Proteus species NOT DETECTED NOT DETECTED Final   Serratia marcescens NOT DETECTED NOT DETECTED Final   Carbapenem resistance NOT DETECTED NOT DETECTED Final   Haemophilus influenzae NOT DETECTED NOT DETECTED Final   Neisseria meningitidis NOT DETECTED NOT DETECTED Final   Pseudomonas aeruginosa NOT DETECTED NOT DETECTED Final   Candida albicans NOT DETECTED NOT DETECTED Final   Candida glabrata NOT DETECTED NOT DETECTED Final   Candida krusei NOT DETECTED NOT  DETECTED Final   Candida parapsilosis NOT DETECTED NOT DETECTED Final   Candida tropicalis NOT DETECTED NOT DETECTED Final  MRSA PCR Screening     Status: Abnormal   Collection Time: 08/05/16 12:25 PM  Result Value Ref Range Status   MRSA by PCR POSITIVE (A) NEGATIVE Final    Comment:        The GeneXpert MRSA Assay (FDA approved for NASAL specimens only), is one component of a comprehensive MRSA colonization surveillance program. It is not intended to diagnose MRSA infection nor to guide or monitor treatment for MRSA infections. RESULT CALLED TO, READ BACK BY AND VERIFIED WITH: RACHEL VERDI 08/05/16 1348 SGD   Culture, expectorated sputum-assessment     Status: None   Collection Time: 08/05/16  1:32 PM  Result Value Ref Range Status   Specimen Description EXPECTORATED SPUTUM  Final   Special Requests NONE  Final   Sputum evaluation THIS SPECIMEN IS ACCEPTABLE FOR SPUTUM CULTURE  Final   Report Status 08/05/2016 FINAL  Final  Culture, respiratory (NON-Expectorated)     Status: None   Collection Time: 08/05/16  1:32 PM  Result Value Ref Range Status   Specimen Description EXPECTORATED SPUTUM  Final   Special Requests NONE Reflexed from [id number]  Final   Gram Stain   Final    ABUNDANT WBC PRESENT,  PREDOMINANTLY PMN ABUNDANT GRAM POSITIVE COCCI IN CLUSTERS MODERATE GRAM POSITIVE COCCI IN PAIRS AND CHAINS FEW GRAM POSITIVE RODS FEW GRAM NEGATIVE COCCOBACILLI Performed at [facility]    Culture FEW METHICILLIN RESISTANT STAPHYLOCOCCUS AUREUS  Final   Report Status 08/09/2016 FINAL  Final   Organism ID, Bacteria METHICILLIN RESISTANT STAPHYLOCOCCUS AUREUS  Final      Susceptibility   Methicillin resistant staphylococcus aureus - MIC*    CIPROFLOXACIN <=0.5 SENSITIVE Sensitive     ERYTHROMYCIN >=8 RESISTANT Resistant     GENTAMICIN <=0.5 SENSITIVE Sensitive     OXACILLIN >=4 RESISTANT Resistant     TETRACYCLINE <=1 SENSITIVE Sensitive     VANCOMYCIN 1 SENSITIVE Sensitive     TRIMETH/SULFA <=10 SENSITIVE Sensitive     CLINDAMYCIN <=0.25 SENSITIVE Sensitive     RIFAMPIN <=0.5 SENSITIVE Sensitive     Inducible Clindamycin NEGATIVE Sensitive     * FEW METHICILLIN RESISTANT STAPHYLOCOCCUS AUREUS  Culture, expectorated sputum-assessment     Status: None   Collection Time: 08/08/16 11:00 AM  Result Value Ref Range Status   Specimen Description EXPECTORATED SPUTUM  Final   Special Requests Normal  Final   Sputum evaluation THIS SPECIMEN IS ACCEPTABLE FOR SPUTUM CULTURE  Final   Report Status 08/08/2016 FINAL  Final  Culture, respiratory (NON-Expectorated)     Status: None   Collection Time: 08/08/16 11:00 AM  Result Value Ref Range Status   Specimen Description EXPECTORATED SPUTUM  Final   Special Requests Normal Reflexed from [id number]  Final   Gram Stain   Final    NO WBC SEEN FEW GRAM POSITIVE COCCI IN PAIRS AND CHAINS FEW GRAM NEGATIVE RODS RARE GRAM POSITIVE RODS Performed at [facility]    Culture   Final    MODERATE METHICILLIN RESISTANT STAPHYLOCOCCUS AUREUS   Report Status 08/11/2016 FINAL  Final   Organism ID, Bacteria METHICILLIN RESISTANT STAPHYLOCOCCUS AUREUS  Final      Susceptibility   Methicillin resistant staphylococcus aureus - MIC*     CIPROFLOXACIN <=0.5 SENSITIVE Sensitive     ERYTHROMYCIN >=8 RESISTANT Resistant     GENTAMICIN <=0.5 SENSITIVE Sensitive  OXACILLIN >=4 RESISTANT Resistant     TETRACYCLINE <=1 SENSITIVE Sensitive     VANCOMYCIN 1 SENSITIVE Sensitive     TRIMETH/SULFA <=10 SENSITIVE Sensitive     CLINDAMYCIN <=0.25 SENSITIVE Sensitive     RIFAMPIN <=0.5 SENSITIVE Sensitive     Inducible Clindamycin NEGATIVE Sensitive     * MODERATE METHICILLIN RESISTANT STAPHYLOCOCCUS AUREUS    RADIOLOGY:  No results found.  Follow up with PCP in 1 week.  Management plans discussed with the patient, family and they are in agreement.  CODE STATUS:     Code Status Orders        Start     Ordered   08/05/16 0843  Full code  Continuous     08/05/16 0844    Code Status History    Date Active Date Inactive Code Status Order ID Comments User Context   This patient has a current code status but no historical code status.      TOTAL TIME TAKING CARE OF THIS PATIENT ON DAY OF DISCHARGE: more than 30 minutes.   Milagros Loll R M.D on 08/12/2016 at 9:53 AM  Between 7am to 6pm - Pager - [phone]  After 6pm go to [email or web address] - password [email or web address] [facility]  [place] [facility]  Office  [phone]  CC: Primary care physician; No PCP Per Patient  Note: This dictation was prepared with Dragon dictation along with smaller phrase technology. Any transcriptional errors that result from this process are unintentional.

## 2016-08-12 NOTE — H&P (View-Only) (Signed)
Physical Medicine and Rehabilitation Admission H&P    Chief Complaint  Patient presents with  . 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 female with history of polysubstance abuse who was using drugs and was found unresponsive at home on 08/05/16. UDS positive for cocaine, benzos, opiates and amphetamines. Patient foaming at mouth with GCS 3. She was intubated in ED and placed on pressors for neurogenic shock from cocaine toxicity with severe metabolic acidosis.  CT head negative for acute changes and neurology recommended full work up to rule out ABI.  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 Cardiology felt that boderline elevation in troponin likely due to demand ischemia.  Patient self extubated on 9/18 and noted to have foul smelling secretions positive for MRSA--antibiotics adjusted to Vancomycin--> clindamycin.   She continues to have issues with agitation, hallucinations and bouts of lethargy resolving.  Diet advanced to regular as attention and alertness improving. Therapy ongoing and CIR recommended for follow up therapy.    Review of Systems  HENT: Negative for hearing loss and tinnitus.   Eyes: Negative for blurred vision and double vision.  Respiratory: Negative for cough and shortness of breath.   Cardiovascular: Negative for chest pain, palpitations and leg swelling.  Gastrointestinal: Negative for abdominal pain, constipation, heartburn and nausea.  Genitourinary: Negative for dysuria and urgency.  Musculoskeletal: Negative for back pain, joint pain, myalgias and neck pain.  Skin: Negative for itching and rash.  Neurological: Positive for dizziness, focal weakness and weakness. Negative for tingling, speech change, seizures and headaches.  Psychiatric/Behavioral: Positive for substance abuse. Negative for depression and  hallucinations. The patient does not have insomnia.       Past Medical History:  Diagnosis Date  . Asthma     History reviewed. No pertinent surgical history.    Family History  Problem Relation Age of Onset  . High blood pressure Mother   . Diabetes Mother   . Stroke Mother   . High blood pressure Father   . AAA (abdominal aortic aneurysm) Father       Social History:  Married. Has two young children at home. She smokes cigarettes "socially". Husband smokes daily. She does not use smokeless tobacco. She reports she drinks 2 mixed drinks daily.  Per reports that she uses drugs.    Allergies  Allergen Reactions  . Aspirin     No prescriptions prior to admission.    Home: Home Living Family/patient expects to be discharged to:: Private residence Living Arrangements: Spouse/significant other, Children Available Help at Discharge: Family Type of Home: House Home Access: Stairs to enter Technical brewer of Steps: 5 Entrance Stairs-Rails: Left, Right Home Layout: Two level Alternate Level Stairs-Number of Steps: 12-15 Bathroom Shower/Tub: Multimedia programmer: Standard Bathroom Accessibility: Yes Home Equipment: None Additional Comments: May go home with parents   Lives With: Spouse, Son, Daughter   Functional History: Prior Function Level of Independence: Independent Comments: Pt. performed all ADL's independently prior to admission, married and has 2 children ages 2 and 77 (65rd and 4th grade).  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  Functional Status:  Mobility: Bed Mobility Overal bed mobility: Needs Assistance Bed Mobility: Supine to Sit Supine to sit: Supervision, +2 for safety/equipment General bed mobility comments: Did well with mobility today,  assist to manage cords/tubes Transfers Overall transfer level: Needs assistance Equipment used: Rolling walker (2 wheeled) Transfers: Sit to/from  Stand Sit to Stand: Min guard, +2 safety/equipment General transfer comment: Pt. able to stand to RW with one UE pushing from EOB.  Ambulation/Gait Ambulation/Gait assistance: Min guard, +2 safety/equipment Ambulation Distance (Feet): 100 Feet Assistive device: Rolling walker (2 wheeled) Gait Pattern/deviations: Step-through pattern General Gait Details: steady gait Gait velocity interpretation: Below normal speed for age/gender    ADL: ADL Overall ADL's : Needs assistance/impaired Eating/Feeding: Set up, Minimal assistance, Bed level Grooming: Wash/dry hands, Wash/dry face, Oral care, Applying deodorant, Brushing hair, Minimal assistance, Set up, Cueing for sequencing Grooming Details (indicate cue type and reason): delayed responses but able to follow steps to complete automatic tasks; limited due to lethargy and first time sitting up in chair Upper Body Dressing : Set up, Minimal assistance Upper Body Dressing Details (indicate cue type and reason): extra time needed Lower Body Dressing: Maximal assistance Lower Body Dressing Details (indicate cue type and reason): pt fatigued sitting up in recliner and not able to sit forward on her own to reach feet and not able to keep eyes open when trying to lean foward  General ADL Comments: today was first time patient was alert enough to transfer from bed to recliner with PT and then work with OT.  Cognition: Cognition Overall Cognitive Status: Impaired/Different from baseline Orientation Level: Oriented X4 Cognition Arousal/Alertness: Awake/alert Behavior During Therapy: Flat affect, WFL for tasks assessed/performed Overall Cognitive Status: Impaired/Different from baseline   Blood pressure (!) 154/103, pulse 71, temperature 98.2 F (36.8 C), temperature source Oral, resp. rate 16, height _0  (1.651 m), weight 103.8 kg (228 lb 12.8 oz), SpO2 97 %. Physical Exam  Nursing note and vitals reviewed. Constitutional: She is oriented to  person, place, and time. She appears well-developed and well-nourished.  obese  HENT:  Head: Normocephalic and atraumatic.  Eyes: Conjunctivae are normal. Pupils are equal, round, and reactive to light.  Neck: Normal range of motion. Neck supple.  Cardiovascular: Normal rate and regular rhythm.   No murmur heard. Respiratory: Effort normal and breath sounds normal. No respiratory distress. She has no wheezes.  GI: Soft. Bowel sounds are normal. She exhibits no distension. There is no tenderness.  Musculoskeletal: Normal range of motion. She exhibits edema (min edema bilateral hands. ). She exhibits no deformity.  Neurological: She is alert and oriented to person, place, and time. No cranial nerve deficit.  Drowsy, having a hard time staying awake. Moves all 4's. Senses pain. Could tell me she was in the hospital because she overdosed on marijuana and cocaine. Follows simple one step commands while awake. Saw no obvious CN abnormalities.    Skin: Skin is warm and dry.  Psychiatric: Her speech is normal. Her affect is blunt. She is slowed. She is inattentive.    Results for orders placed or performed during the hospital encounter of 08/05/16 (from the past 48 hour(s))  Glucose, capillary     Status: Abnormal   Collection Time: 08/10/16  4:37 PM  Result Value Ref Range   Glucose-Capillary 122 (H) 65 - 99 mg/dL  Glucose, capillary     Status: Abnormal   Collection Time: 08/10/16  7:33 PM  Result Value Ref Range   Glucose-Capillary 130 (H) 65 - 99 mg/dL   Comment 1 Notify RN   Glucose, capillary     Status: Abnormal   Collection Time: 08/10/16 11:58 PM  Result Value Ref Range  Glucose-Capillary 101 (H) 65 - 99 mg/dL  Glucose, capillary     Status: None   Collection Time: 08/11/16  3:45 AM  Result Value Ref Range   Glucose-Capillary 74 65 - 99 mg/dL   Comment 1 Notify RN   Magnesium in AM     Status: Abnormal   Collection Time: 08/11/16  5:00 AM  Result Value Ref Range   Magnesium  1.6 (L) 1.7 - 2.4 mg/dL  Hepatic function panel     Status: Abnormal   Collection Time: 08/11/16  5:00 AM  Result Value Ref Range   Total Protein 5.8 (L) 6.5 - 8.1 g/dL   Albumin 2.8 (L) 3.5 - 5.0 g/dL   AST 74 (H) 15 - 41 U/L   ALT 988 (H) 14 - 54 U/L   Alkaline Phosphatase 39 38 - 126 U/L   Total Bilirubin 1.2 0.3 - 1.2 mg/dL   Bilirubin, Direct 0.3 0.1 - 0.5 mg/dL   Indirect Bilirubin 0.9 0.3 - 0.9 mg/dL  CBC     Status: Abnormal   Collection Time: 08/11/16  5:00 AM  Result Value Ref Range   WBC 8.7 3.6 - 11.0 K/uL   RBC 3.23 (L) 3.80 - 5.20 MIL/uL   Hemoglobin 10.4 (L) 12.0 - 16.0 g/dL   HCT 29.6 (L) 35.0 - 47.0 %   MCV 91.5 80.0 - 100.0 fL   MCH 32.1 26.0 - 34.0 pg   MCHC 35.0 32.0 - 36.0 g/dL   RDW 13.3 11.5 - 14.5 %   Platelets 128 (L) 150 - 440 K/uL  Comprehensive metabolic panel     Status: Abnormal   Collection Time: 08/11/16  5:13 AM  Result Value Ref Range   Sodium 139 135 - 145 mmol/L   Potassium 3.5 3.5 - 5.1 mmol/L   Chloride 106 101 - 111 mmol/L   CO2 29 22 - 32 mmol/L   Glucose, Bld 119 (H) 65 - 99 mg/dL   BUN 11 6 - 20 mg/dL   Creatinine, Ser 0.89 0.44 - 1.00 mg/dL   Calcium 8.0 (L) 8.9 - 10.3 mg/dL   Total Protein 5.7 (L) 6.5 - 8.1 g/dL   Albumin 2.7 (L) 3.5 - 5.0 g/dL   AST 73 (H) 15 - 41 U/L   ALT 909 (H) 14 - 54 U/L   Alkaline Phosphatase 37 (L) 38 - 126 U/L   Total Bilirubin 1.2 0.3 - 1.2 mg/dL   GFR calc non Af Amer >60 >60 mL/min   GFR calc Af Amer >60 >60 mL/min    Comment: (NOTE) The eGFR has been calculated using the CKD EPI equation. This calculation has not been validated in all clinical situations. eGFR's persistently <60 mL/min signify possible Chronic Kidney Disease.    Anion gap 4 (L) 5 - 15  Glucose, capillary     Status: Abnormal   Collection Time: 08/11/16  7:13 AM  Result Value Ref Range   Glucose-Capillary 104 (H) 65 - 99 mg/dL  Glucose, capillary     Status: Abnormal   Collection Time: 08/11/16 11:30 AM  Result Value Ref  Range   Glucose-Capillary 122 (H) 65 - 99 mg/dL  Ammonia     Status: None   Collection Time: 08/11/16 12:10 PM  Result Value Ref Range   Ammonia 20 9 - 35 umol/L  Glucose, capillary     Status: Abnormal   Collection Time: 08/11/16  8:43 PM  Result Value Ref Range   Glucose-Capillary 103 (H) 65 - 99  mg/dL  Glucose, capillary     Status: None   Collection Time: 08/12/16 12:01 AM  Result Value Ref Range   Glucose-Capillary 70 65 - 99 mg/dL  Glucose, capillary     Status: None   Collection Time: 08/12/16  3:48 AM  Result Value Ref Range   Glucose-Capillary 92 65 - 99 mg/dL  Basic metabolic panel     Status: Abnormal   Collection Time: 08/12/16  5:12 AM  Result Value Ref Range   Sodium 137 135 - 145 mmol/L   Potassium 3.4 (L) 3.5 - 5.1 mmol/L   Chloride 103 101 - 111 mmol/L   CO2 29 22 - 32 mmol/L   Glucose, Bld 90 65 - 99 mg/dL   BUN 11 6 - 20 mg/dL   Creatinine, Ser 0.93 0.44 - 1.00 mg/dL   Calcium 8.3 (L) 8.9 - 10.3 mg/dL   GFR calc non Af Amer >60 >60 mL/min   GFR calc Af Amer >60 >60 mL/min    Comment: (NOTE) The eGFR has been calculated using the CKD EPI equation. This calculation has not been validated in all clinical situations. eGFR's persistently <60 mL/min signify possible Chronic Kidney Disease.    Anion gap 5 5 - 15  Magnesium     Status: None   Collection Time: 08/12/16  5:12 AM  Result Value Ref Range   Magnesium 1.7 1.7 - 2.4 mg/dL  Phosphorus     Status: None   Collection Time: 08/12/16  5:12 AM  Result Value Ref Range   Phosphorus 4.1 2.5 - 4.6 mg/dL  Glucose, capillary     Status: None   Collection Time: 08/12/16  7:54 AM  Result Value Ref Range   Glucose-Capillary 91 65 - 99 mg/dL   No results found.     Medical Problem List and Plan: 1.  Cognitive, mobility and functional deficits secondary to anoxic brain injury  -admit to inpatient rehab 2.  DVT Prophylaxis/Anticoagulation: Pharmaceutical: Lovenox 3. Pain Management: tylenol prn 4. Mood:  LCSW to follow for evaluation and support.  5. Neuropsych: This patient is not capable of making decisions on her own behalf.  -re-establish sleep wake cycle 6. Skin/Wound Care: routine pressure relief measures. 7. Fluids/Electrolytes/Nutrition: Monitor I/O. Intake improving. Check lytes in am. 8. Hypokalemia: Will supplement today.  Recheck labs in am.  10. Abnormal LFTs: Due to shocked liver--slowly resolving. HepA/Hep B negative. Ammonia levels WNL.  Will recheck LFTs  in am.  11. MRSA tracheitis: On clindamycin for 4 more days.  12.  Polysubstance abuse: educate on cessation/ refer to counseling.      Post Admission Physician Evaluation: 1. Functional deficits secondary  to anoxic brain injury. 2. Patient is admitted to receive collaborative, interdisciplinary care between the physiatrist, rehab nursing staff, and therapy team. 3. Patient's level of medical complexity and substantial therapy needs in context of that medical necessity cannot be provided at a lesser intensity of care such as a SNF. 4. Patient has experienced substantial functional loss from his/her baseline which was documented above under the "Functional History" and "Functional Status" headings.  Judging by the patient's diagnosis, physical exam, and functional history, the patient has potential for functional progress which will result in measurable gains while on inpatient rehab.  These gains will be of substantial and practical use upon discharge  in facilitating mobility and self-care at the household level. 5. Physiatrist will provide 24 hour management of medical needs as well as oversight of the therapy plan/treatment and provide  guidance as appropriate regarding the interaction of the two. 6. 24 hour rehab nursing will assist with bladder management, bowel management, safety, skin/wound care, disease management, medication administration, pain management and patient education  and help integrate therapy concepts,  techniques,education, etc. 7. PT will assess and treat for/with: Lower extremity strength, range of motion, stamina, balance, functional mobility, safety, adaptive techniques and equipment, cognitive perceptual awareness, family education.   Goals are: supervision to mod I. 8. OT will assess and treat for/with: ADL's, functional mobility, safety, upper extremity strength, adaptive techniques and equipment, NMR, cognitive perceptual awareness, ego support, family education.   Goals are: supervision to mod I. Therapy may proceed with showering this patient. 9. SLP will assess and treat for/with: cognition, education, communication.  Goals are: supervision to min assist. 10. Case Management and Social Worker will assess and treat for psychological issues and discharge planning. 11. Team conference will be held weekly to assess progress toward goals and to determine barriers to discharge. 12. Patient will receive at least 3 hours of therapy per day at least 5 days per week. 13. ELOS: 11-16 days       14. Prognosis:  good     Meredith Staggers, MD, [facility] 08/12/2016  08/12/2016

## 2016-08-12 NOTE — Interval H&P Note (Signed)
Martha Lawson was admitted today to Inpatient Rehabilitation with the diagnosis of anoxic brain injury.  The patient's history has been reviewed, patient examined, and there is no change in status.  Patient continues to be appropriate for intensive inpatient rehabilitation.  I have reviewed the patient's chart and labs.  Questions were answered to the patient's satisfaction. The PAPE has been reviewed and assessment remains appropriate.  Spent expensive time counseling family regarding prognosis and living environment [facility]  Martha Lawson T 08/12/2016, 2:25 PM

## 2016-08-12 NOTE — Progress Notes (Signed)
Inpatient Rehabilitation  Our team has reviewed the chart this morning and has a bed available to offer the patient.  Patient and family in agreement with plan.  I have notified RNCM, Steward DroneBrenda and await acute MD clearance prior to admission to IP Rehab room [id number] today.  Please call with questions.    Charlane FerrettiMelissa Alvine Mostafa, M.A., CCC/SLP Admission Coordinator  [facility]  Cell [phone]

## 2016-08-12 NOTE — Progress Notes (Signed)
Pt being discharged to [facility] rehab, pt with no noted complaints, no distress or discomfort noted

## 2016-08-12 NOTE — Care Management (Addendum)
Received telephone call from Fae PippinMelissa Bowie, RN representative [facility]. Will be able to accept Ms. Brester into their program today. Will update Dr. Elpidio AnisSudini and nurse. Dr. Riley KillSwartz is the accepting physician. Room 4MW3. Discharge to Inpatient Acute Rehabilitation today per DrMarland Kitchen.. Elpidio AnisSudini.  Will be transported per [facility] Gwenette GreetBrenda S Lamiyah Schlotter RN MSN CCM Care Management [phone]

## 2016-08-12 NOTE — Progress Notes (Signed)
Report called to [facility], awaiting transport, family at bedside

## 2016-08-12 NOTE — Progress Notes (Signed)
Martha Oyster, MD Physician Signed Physical Medicine and Rehabilitation  PMR Pre-admission Date of Service: 08/11/2016 9:02 PM  Related encounter: ED to Hosp-Admission (Discharged) from 08/05/2016 in [facility] (1C)       [] Hide copied text   [employer] PMR Admission Coordinator Pre-Admission Assessment  Patient: Martha Lawson is an 35 y.o., female MRN: [id number] DOB: 09/26/1981 Height: 5\' 5"  (165.1 cm) Weight: 103.8 kg (228 lb 12.8 oz)  Insurance Information HMO:     PPO:      PCP:      IPA:      80/20:      OTHER:  PRIMARY: uninsured       Policy#:       Subscriber:  CM Name:       Phone#:      Fax#:  Pre-Cert#:       Employer:  Benefits:  Phone #:      Name:  Eff. Date:      Deduct:       Out of Pocket Max:       Life Max:  CIR:       SNF:  Outpatient:      Co-Pay:  Home Health:       Co-Pay:  DME:      Co-Pay:  Providers:   Medicaid Application Date:       Case Manager:  Disability Application Date:       Case Worker:   Emergency Contact Information        Contact Information    Name Relation Home Work Pass Martha  [phone]     Martha Lawson Mother   [phone]   Martha Lawson Father   [phone]      Current Medical History  Patient Admitting Diagnosis: Anoxic Brain Injury   History of Present Illness: Martha Debellis Smithis a 35 y.o.femalewith history of polysubstance abuse who was using drugs andwas found unresponsive at home on 08/05/16. UDS positive for cocaine, benzos, opiates and amphetamines. Patient foaming at mouth with GCS 3. She was intubated in ED and placed on pressors for neurogenic shock from cocaine toxicity with severe metabolic acidosis. CT head negative for acute changes and neurology recommended full work up to rule out ABI. 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 Cardiology felt that boderline elevation in troponin likely due to demand ischemia. Patient self extubated on 9/18 and noted to have foul smelling secretions positive for MRSA--antibiotics adjusted to Vancomycin-->clindamycin. Diet advanced to regular as attention and alertness improving. She has had issues with agitation, fluctuating bouts of lethargy as well as hallucinations. Therapies recommending IP Rehab for post acute therapy and patient admitted.    Patient's medical record from [facility] has been reviewed by the rehabilitation admission coordinator and physician.  NIH Stroke scale: N/A Glascow Coma Scale: 15  Past Medical History      Past Medical History:  Diagnosis Date  . Asthma     Family History   family history is not on file.  Prior Rehab/Hospitalizations Has the patient had major surgery during 100 days prior to admission? No               Current Medications See MAR from [facility] records        Patients Current Diet:  Regular textures and thin liquids with aspiration precautions and full supervision  Precautions / Restrictions Precautions Precautions: Fall Restrictions Weight Bearing Restrictions: No   Has the patient had 2 or more falls or a fall with injury in the past year?No  Prior Activity Level Limited Community (1-2x/wk): Prior to admission patient was independent, not working, and lived with her spouse and 2 children.  Per her mother's report she and her husband were having a hard time, and not always getting along.  Per her mother she was depressed and would use drugs with her spouse.     Prior Functional Level Self Care: Did the patient need help bathing, dressing, using the toilet or eating? Independent  Indoor Mobility: Did the patient need assistance with walking from room to room (with or without device)? Independent  Stairs: Did the patient need assistance with internal or external stairs  (with or without device)? Independent  Functional Cognition: Did the patient need help planning regular tasks such as shopping or remembering to take medications? Independent  Home Assistive Devices / Equipment Home Assistive Devices/Equipment: None Home Equipment: None  Prior Device Use: Indicate devices/aids used by the patient prior to current illness, exacerbation or injury? None of the above   Prior Functional Level Current Functional Level  Bed Mobility Independent  Supervision +2  Transfers Independent  Min A +2  Mobility - Walk/Wheelchair Independent  Ambulation Min A 100 feet with rolling walker   Upper Body Dressing Independent  Min A  Lower Body Dressing Independent  Max A  Grooming Independent  Set-up, then Min A  Eating/Drinking Independent  Set-up, then Min A  Toilet Transfer Independent  Min A +2 for safety   Bladder Continence  Yes  has been voiding since foley was removed 08/11/16  Bowel Management Continent   08/11/16 continent   Stair Climbing Independent   Not attempted   Communication Independent   WFL for basic, flat   Memory Independent   Impaired   Cooking/Meal Prep Independent      Housework Independent    Money Management unknown    Driving Independent      Special needs/care consideration BiPAP/CPAP: No CPM: No Continuous Drip IV: No Dialysis: No         Life Vest: No Oxygen:  No Special Bed: No Trach Size: No Wound Vac (area): No      Skin: WDL                          Bowel mgmt: 08/11/16 Bladder mgmt: Foley removed 9/21 with reports of continence    Diabetic mgmt: No  Previous Home Environment Living Arrangements: Spouse/significant other, Children  Lives With: Spouse, Son, Daughter Available Help at Discharge: Family Type of Home: House Home Layout: Two level Alternate Level Stairs-Number of Steps: 12-15 Home Access: Stairs to enter Entrance Stairs-Rails: Left, Right Entrance Stairs-Number of Steps: 5 Bathroom Shower/Tub:  Health visitor: Standard Bathroom Accessibility: Yes How Accessible: Accessible via walker Home Care Services: No Additional Comments: May go home with parents   Discharge Living Setting Plans for Discharge Living Setting: Other (Comment) (May go home with mom) Type of Home at Discharge: House Discharge Home Layout: One level Discharge Home Access: Ramped entrance Discharge Bathroom Shower/Tub: Tub/shower unit, Curtain Discharge Bathroom Toilet: Standard Discharge Bathroom Accessibility: Yes How Accessible: Accessible via walker Does the patient have any problems obtaining your medications?: Yes (Describe) (patient is uninsured)  Social/Family/Support Systems Patient Roles: Parent, Spouse Contact Information: [place], [place]: [phone] Anticipated Caregiver: Ernest Mallick  Shawnie DapperLopez:: [phone] Anticipated Caregiver's Contact Information: Cristi LoronDad, Ray Lawson: [phone] Ability/Limitations of Caregiver: None Caregiver Availability: 24/7 Discharge Plan Discussed with Primary Caregiver: Yes Is Caregiver In Agreement with Plan?: Yes Does Caregiver/Family have Issues with Lodging/Transportation while Pt is in Rehab?: No  Goals/Additional Needs Patient/Family Goal for Rehab: PT/OT/SLP Supervision  Expected length of stay: 12-14 days  Cultural Considerations: None Dietary Needs: Regular textures and thin liquids  Equipment Needs: TBD Special Service Needs: Needs [facility] Fin[facility] Counselor  Additional Information: Family has requested application for Mediciaid and disability applications Pt/Family Agrees to Admission and willing to participate: Yes Program Orientation Provided & Reviewed with Pt/Caregiver Including Roles  & Responsibilities: Yes Additional Information Needs: See above  Information Needs to be Provided By: CSW and Artistinancial Counselor   Patient Condition: I have reviewed the patient's medical record and  spoken with the patient and family over the phone.  They are eager for rehab to facilitate her safe transition home.  Patient has ongoing medical, PT, OT, and SLP needs due to deficits in self-care, balance, memory, and safety awareness.  Given that patient was an active and independent prior to admission and caring for her children she makes an excellent candidate for an IP Rehab admission.     Preadmission Screen Completed By:  Fae PippinMelissa Francisco Ostrovsky, 08/12/2016 8:44 AM ______________________________________________________________________   Discussed status with Dr. Riley KillSwartz on 08/12/16 at 0910 and received telephone approval for admission today.  Admission Coordinator:  Fae PippinMelissa Tamberly Pomplun, time 0910/Date 08/12/16   Assessment/Plan: Diagnosis: anoxic BI 1. Does the need for close, 24 hr/day  Medical supervision in concert with the patient's rehab needs make it unreasonable for this patient to be served in a less intensive setting? Yes 2. Co-Morbidities requiring supervision/potential complications: behavior/cognitive issues 3. Due to bladder management, bowel management, safety, skin/wound care, disease management, medication administration, pain management and patient education, does the patient require 24 hr/day rehab nursing? Yes 4. Does the patient require coordinated care of a physician, rehab nurse, PT (1-2 hrs/day, 5 days/week), OT (1-2 hrs/day, 5 days/week) and SLP (1-2 hrs/day, 5 days/week) to address physical and functional deficits in the context of the above medical diagnosis(es)? Yes Addressing deficits in the following areas: balance, endurance, locomotion, strength, transferring, bowel/bladder control, bathing, dressing, feeding, grooming, toileting, cognition and psychosocial support 5. Can the patient actively participate in an intensive therapy program of at least 3 hrs of therapy 5 days a week? Yes 6. The potential for patient to make measurable gains while on inpatient rehab is  excellent 7. Anticipated functional outcomes upon discharge from inpatients are: supervision PT, supervision OT, supervision SLP 8. Estimated rehab length of stay to reach the above functional goals is: 12-16 days 9. Does the patient have adequate social supports to accommodate these discharge functional goals? Yes 10. Anticipated D/C setting: Home 11. Anticipated post D/C treatments: HH therapy and Outpatient therapy 12. Overall Rehab/Functional Prognosis: excellent    RECOMMENDATIONS: This patient's condition is appropriate for continued rehabilitative care in the following setting: CIR Patient has agreed to participate in recommended program. Yes Note that insurance prior authorization may be required for reimbursement for recommended care.  Comment: admit to inpatient rehab today  Martha OysterZachary T. Swartz, MD, [facility] LLCFAAPMR [facility] 08/12/2016   Fae PippinMelissa Rand Boller 08/12/2016    Revision History

## 2016-08-12 NOTE — Progress Notes (Signed)
Received pt. As a transfer from [place].Pt. And her family has been oriented to the unit routine and protocol.Safety plan was explained,fall prevention plan was explained and signed by pt's family and RN.Welcome video was presented.

## 2016-08-12 NOTE — Progress Notes (Signed)
Called report to rehab Doree FudgeLuz

## 2016-08-13 ENCOUNTER — Inpatient Hospital Stay (HOSPITAL_COMMUNITY): Payer: Medicaid Other

## 2016-08-13 ENCOUNTER — Inpatient Hospital Stay (HOSPITAL_COMMUNITY): Payer: Self-pay | Admitting: Physical Therapy

## 2016-08-13 ENCOUNTER — Inpatient Hospital Stay (HOSPITAL_COMMUNITY): Payer: Medicaid Other | Admitting: Speech Pathology

## 2016-08-13 LAB — COMPREHENSIVE METABOLIC PANEL
ALBUMIN: 2.9 g/dL — AB (ref 3.5–5.0)
ALT: 514 U/L — ABNORMAL HIGH (ref 14–54)
AST: 50 U/L — AB (ref 15–41)
Alkaline Phosphatase: 36 U/L — ABNORMAL LOW (ref 38–126)
Anion gap: 11 (ref 5–15)
BUN: 8 mg/dL (ref 6–20)
CHLORIDE: 100 mmol/L — AB (ref 101–111)
CO2: 28 mmol/L (ref 22–32)
Calcium: 9.1 mg/dL (ref 8.9–10.3)
Creatinine, Ser: 0.96 mg/dL (ref 0.44–1.00)
GFR calc Af Amer: >60 mL/min (ref >60)
GLUCOSE: 83 mg/dL (ref 65–99)
POTASSIUM: 3.6 mmol/L (ref 3.5–5.1)
SODIUM: 139 mmol/L (ref 135–145)
Total Bilirubin: 1.1 mg/dL (ref 0.3–1.2)
Total Protein: 6.3 g/dL — ABNORMAL LOW (ref 6.5–8.1)

## 2016-08-13 LAB — CBC WITH DIFFERENTIAL/PLATELET
BASOS ABS: 0 10*3/uL (ref 0.0–0.1)
BASOS PCT: 0 %
EOS ABS: 0.2 10*3/uL (ref 0.0–0.7)
EOS PCT: 3 %
HCT: 32.9 % — ABNORMAL LOW (ref 36.0–46.0)
Hemoglobin: 10.9 g/dL — ABNORMAL LOW (ref 12.0–15.0)
Lymphocytes Relative: 30 %
Lymphs Abs: 2.6 10*3/uL (ref 0.7–4.0)
MCH: 30.4 pg (ref 26.0–34.0)
MCHC: 33.1 g/dL (ref 30.0–36.0)
MCV: 91.6 fL (ref 78.0–100.0)
MONO ABS: 0.9 10*3/uL (ref 0.1–1.0)
Monocytes Relative: 10 %
Neutro Abs: 5.1 10*3/uL (ref 1.7–7.7)
Neutrophils Relative %: 57 %
PLATELETS: 202 10*3/uL (ref 150–400)
RBC: 3.59 MIL/uL — ABNORMAL LOW (ref 3.87–5.11)
RDW: 13.2 % (ref 11.5–15.5)
WBC: 8.8 10*3/uL (ref 4.0–10.5)

## 2016-08-13 NOTE — Evaluation (Signed)
Speech Language Pathology Assessment and Plan  Patient Details  Name: Martha Lawson MRN: [id number] Date of Birth: Dec 03, 1980  SLP Diagnosis: Cognitive Impairments;Speech and Language deficits;Dysphagia  Rehab Potential: Good ELOS: 7-10 days     Today's Date: 08/13/2016 SLP Individual Time: 1105-1200 SLP Individual Time Calculation (min): 55 min    Problem List:  Patient Active Problem List   Diagnosis Date Noted  . Anoxic brain injury ([place]) 08/12/2016  . Polysubstance abuse 08/12/2016  . Metabolic acidosis   . Elevated LFTs   . Shock liver   . Acute respiratory failure ([place]) 08/05/2016  . Unresponsive    Past Medical History:  Past Medical History:  Diagnosis Date  . Asthma    in fall due to allergies   Past Surgical History: No past surgical history on file.  Assessment / Plan / Recommendation Clinical Impression is an 35 y.o.femalewith history of polysubstance abuse who was using drugs and was found unresponsive at home on 08/05/16. UDS positive for cocaine, benzos, opiates and amphetamines. Patient foaming at mouth with GCS 3. She was intubated in ED and placed on pressors for neurogenic shock from cocaine toxicity with severe metabolic acidosis.  CT head negative for acute changes and neurology recommended full work up to rule out ABI.  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 Cardiology felt that boderline elevation in troponin likely due to demand ischemia.  Patient self extubated on 9/18 and noted to have foul smelling secretions positive for MRSA--antibiotics adjusted to Vancomycin--> clindamycin. She continues to have issues with agitation, hallucinations and bouts of lethargy resolving.  Diet advanced to regular as attention and alertness improving. Therapy ongoing and CIR recommended for follow up therapy. Patient admitted 08/12/16.  Patient  demonstrates moderate-severe cognitive impairments characterized by decreased sustained attention, problem solving, intellectual awareness, initiation, recall and overall safety.  Patient also demonstrates a hoarse, low vocal quality impacting her overall intelligibility. Suspect due to intubation. Patient with constant throat clearing throughout entire evaluation, therefore, difficult to differntiate possible penetration with trials. Patient consumed regular textures and demonstrated efficient mastication with thin liquids via straw. Throat clearing did not appear more severe throughout trials. Per RN charting, patient with clear lung sounds without low grade fevers, therefore, recommend patient continue current diet of regular textures with thin liquids. Patient would benefit from skilled SLP intervention to maximize her cognitive and swallowing function in order to maximize her overall functional independence prior to discharge.     Skilled Therapeutic Interventions          Administered a cognitive-linguistic evaluation and BSE. Please see above for details. Patient administered the MoCA (version 8.1) and scored 15/30 points with a score of 26 or above considered normal. Educated patient in regards to her current cognitive impairments and goals of skilled SLP intervention. She verbalized understanding but will need reinforcement.    SLP Assessment  Patient will need skilled Speech Lanaguage Pathology Services during CIR admission    Recommendations  SLP Diet Recommendations: Age appropriate regular solids;Thin Liquid Administration via: Cup;Straw Medication Administration: Whole meds with liquid Supervision: Patient able to self feed;Intermittent supervision to cue for compensatory strategies Compensations: Minimize environmental distractions;Slow rate;Small sips/bites;Follow solids with liquid Postural Changes and/or Swallow Maneuvers: Seated upright 90 degrees;Upright 30-60 min after meal Oral  Care Recommendations: Oral care BID Patient destination: Home Follow up Recommendations: 24 hour supervision/assistance;Outpatient SLP;Home Health SLP Equipment Recommended: None recommended  by SLP    SLP Frequency 3 to 5 out of 7 days   SLP Duration  SLP Intensity  SLP Treatment/Interventions 7-10 days   Minumum of 1-2 x/day, 30 to 90 minutes  Cognitive remediation/compensation;Cueing hierarchy;Functional tasks;Patient/family education;Environmental controls;Internal/external aids;Speech/Language facilitation;Therapeutic Activities    Pain Pain Assessment Pain Assessment: No/denies pain  Prior Functioning Type of Home: House  Lives With: Spouse;Son;Daughter Available Help at Discharge: Family Education: GED Vocation: Works at home  Function:  Eating Eating   Modified Consistency Diet: No Eating Assist Level: More than reasonable amount of time           Cognition Comprehension Comprehension assist level: Understands basic 50 - 74% of the time/ requires cueing 25 - 49% of the time  Expression   Expression assist level: Expresses basic 50 - 74% of the time/requires cueing 25 - 49% of the time. Needs to repeat parts of sentences.  Social Interaction Social Interaction assist level: Interacts appropriately 25 - 49% of time - Needs frequent redirection.  Problem Solving Problem solving assist level: Solves basic 25 - 49% of the time - needs direction more than half the time to initiate, plan or complete simple activities  Memory Memory assist level: Recognizes or recalls 25 - 49% of the time/requires cueing 50 - 75% of the time   Short Term Goals: Week 1: SLP Short Term Goal 1 (Week 1): Patient will consume current diet with minimal overt s/s of aspiration with Mod I for use of swallowing compensatory strategies.  SLP Short Term Goal 2 (Week 1): Patient will utilize an increased vocal intensity at the phrase level with Min A verbal cues to achieve 75% intelligibility.   SLP Short Term Goal 3 (Week 1): Patient will initiate functional tasks with Min A verbal cues.  SLP Short Term Goal 4 (Week 1): Patient will demonstrate sustained attention to tasks for ~5 minutes with Mod A verbal cues for redirection.  SLP Short Term Goal 5 (Week 1): Patient will demonstrate functional problem solving for basic and familiar tasks with Mod A verbal cues.  SLP Short Term Goal 6 (Week 1): Patient will recall new, daily information with Mod A verbal cues for use of external aids.   Refer to Care Plan for Long Term Goals  Recommendations for other services: Neuropsych  Discharge Criteria: Patient will be discharged from SLP if patient refuses treatment 3 consecutive times without medical reason, if treatment goals not met, if there is a change in medical status, if patient makes no progress towards goals or if patient is discharged from hospital.  The above assessment, treatment plan, treatment alternatives and goals were discussed and mutually agreed upon: by patient  Juelle Dickmann 08/13/2016, 12:35 PM

## 2016-08-13 NOTE — Progress Notes (Signed)
Martha GravelJessica Z Lawson is a 35 y.o. female 03-22-1981 [id number]  Subjective: No new complaints. No new problems. Slept well. Feeling OK.  Objective: Vital signs in last 24 hours: Temp:  [97.8 F (36.6 C)-97.9 F (36.6 C)] 97.8 F (36.6 C) (09/23 0434) Pulse Rate:  [57-85] 64 (09/23 0434) Resp:  [16-18] 18 (09/23 0434) BP: (143-154)/(67-91) 143/85 (09/23 0434) SpO2:  [98 %-100 %] 100 % (09/23 0434) Weight change:  Last BM Date: 08/12/16  Intake/Output from previous day: 09/22 0701 - 09/23 0700 In: 480 [P.O.:480] Out: -  Last cbgs: CBG (last 3)   Recent Labs  08/12/16 0001 08/12/16 0348 08/12/16 0754  GLUCAP 70 92 91     Physical Exam General: No apparent distress   HEENT: not dry Lungs: Normal effort. Lungs clear to auscultation, no crackles or wheezes. Cardiovascular: Regular rate and rhythm, no edema Abdomen: S/NT/ND; BS(+) Musculoskeletal:  unchanged Neurological: No new neurological deficits Wounds: N/A    Skin: clear  Mental state: Alert, cooperative    Lab Results: BMET    Component Value Date/Time   NA 139 08/13/2016 0457   K 3.6 08/13/2016 0457   CL 100 (L) 08/13/2016 0457   CO2 28 08/13/2016 0457   GLUCOSE 83 08/13/2016 0457   BUN 8 08/13/2016 0457   CREATININE 0.96 08/13/2016 0457   CALCIUM 9.1 08/13/2016 0457   GFRNONAA >60 08/13/2016 0457   GFRAA >60 08/13/2016 0457   CBC    Component Value Date/Time   WBC 8.8 08/13/2016 0457   RBC 3.59 (L) 08/13/2016 0457   HGB 10.9 (L) 08/13/2016 0457   HGB 12.2 05/22/2014 1845   HCT 32.9 (L) 08/13/2016 0457   HCT 36.0 05/22/2014 1845   PLT 202 08/13/2016 0457   PLT 262 05/22/2014 1845   MCV 91.6 08/13/2016 0457   MCV 92 05/22/2014 1845   MCH 30.4 08/13/2016 0457   MCHC 33.1 08/13/2016 0457   RDW 13.2 08/13/2016 0457   RDW 13.0 05/22/2014 1845   LYMPHSABS 2.6 08/13/2016 0457   LYMPHSABS 2.7 05/22/2014 1845   MONOABS 0.9 08/13/2016 0457   MONOABS 0.6 05/22/2014 1845   EOSABS 0.2 08/13/2016 0457    EOSABS 0.2 05/22/2014 1845   BASOSABS 0.0 08/13/2016 0457   BASOSABS 0.1 05/22/2014 1845    Studies/Results: No results found.  Medications: I have reviewed the patient's current medications.  Assessment/Plan:  1.  Cognitive, mobility and functional deficits secondary to anoxic brain injury             -admit to inpatient rehab 2.  DVT Prophylaxis/Anticoagulation: Pharmaceutical: Lovenox 3. Pain Management: tylenol prn 4. Mood: LCSW to follow for evaluation and support.  5. Neuropsych: This patient is not capable of making decisions on her own behalf.             -re-establish sleep wake cycle 6. Skin/Wound Care: routine pressure relief measures. 7. Fluids/Electrolytes/Nutrition: Monitor I/O. Intake improving. Check lytes in am. 8. Hypokalemia: Will supplement today.  Recheck labs in am.  10. Abnormal LFTs: Due to shocked liver--slowly resolving. HepA/Hep B negative. Ammonia levels WNL.  Will recheck LFTs  in am.  11. MRSA tracheitis: On clindamycin for 4 more days.  12.  Polysubstance abuse: educate on cessation/ refer to counseling.       Length of stay, days: 1  Sonda PrimesAlex Charo Philipp , MD 08/13/2016, 12:35 PM

## 2016-08-13 NOTE — Evaluation (Signed)
Physical Therapy Assessment and Plan  Patient Details  Name: NATALE BARBA MRN: [id number] Date of Birth: [birth year]  PT Diagnosis: Abnormality of gait, Cognitive deficits, Impaired cognition and Muscle weakness Rehab Potential: Excellent ELOS: 7-10 days    Today's Date: 08/13/2016 PT Individual Time: 1300-1410 PT Individual Time Calculation (min): 70 min     Problem List:  Patient Active Problem List   Diagnosis Date Noted  . Anoxic brain injury ([place]) 08/12/2016  . Polysubstance abuse 08/12/2016  . Metabolic acidosis   . Elevated LFTs   . Shock liver   . Acute respiratory failure ([place]) 08/05/2016  . Unresponsive     Past Medical History:  Past Medical History:  Diagnosis Date  . Asthma    in fall due to allergies   Past Surgical History: No past surgical history on file.  Assessment & Plan Clinical Impression: Patient is a 35 y.o.femalewith history of polysubstance abuse who was using drugs and was found unresponsive at home on 08/05/16. UDS positive for cocaine, benzos, opiates and amphetamines. Patient foaming at mouth with GCS 3. She was intubated in ED and placed on pressors for neurogenic shock from cocaine toxicity with severe metabolic acidosis.  CT head negative for acute changes and neurology recommended full work up to rule out ABI.  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 Cardiology felt that boderline elevation in troponin likely due to demand ischemia.  Patient self extubated on 9/18 and noted to have foul smelling secretions positive for MRSA--antibiotics adjusted to Vancomycin--> clindamycin.   She continues to have issues with agitation, hallucinations and bouts of lethargy resolving.  Diet advanced to regular as attention and alertness improving.  Patient transferred to CIR on 08/12/2016 .   Patient currently requires supervision with mobility  secondary to muscle weakness, decreased cardiorespiratoy endurance and decreased balance strategies.  Prior to hospitalization, patient was independent  with mobility and lived with Spouse, Son, Daughter in a House home.  Home access is 5Stairs to enter, Ramped entrance.  Patient will benefit from skilled PT intervention to maximize safe functional mobility, minimize fall risk and decrease caregiver burden for planned discharge home with intermittent assist.  Anticipate patient will benefit from follow up OP at discharge.  PT - End of Session Activity Tolerance: Tolerates 30+ min activity with multiple rests Endurance Deficit: Yes PT Assessment Rehab Potential (ACUTE/IP ONLY): Excellent Barriers to Discharge: Decreased caregiver support;Inaccessible home environment PT Patient demonstrates impairments in the following area(s): Balance;Behavior;Endurance;Safety PT Transfers Functional Problem(s): Bed Mobility;Bed to Chair;Car;Furniture;Floor;Other (comment) PT Locomotion Functional Problem(s): Ambulation;Wheelchair Mobility;Stairs PT Plan PT Intensity: Minimum of 1-2 x/day ,45 to 90 minutes PT Frequency: 5 out of 7 days PT Duration Estimated Length of Stay: 7-10 days  PT Treatment/Interventions: Ambulation/gait training;Balance/vestibular training;Cognitive remediation/compensation;Community reintegration;Discharge planning;DME/adaptive equipment instruction;Functional mobility training;Neuromuscular re-education;Patient/family education;Psychosocial support;Skin care/wound management;Stair training;Therapeutic Activities;Therapeutic Exercise;Visual/perceptual remediation/compensation;UE/LE Strength taining/ROM;UE/LE Coordination activities;Wheelchair propulsion/positioning PT Transfers Anticipated Outcome(s): mod I  PT Locomotion Anticipated Outcome(s): Mod I  PT Recommendation Recommendations for Other Services: Neuropsych consult Follow Up Recommendations: Outpatient PT Patient destination:  Home Equipment Recommended: To be determined    Skilled Therapeutic Intervention  Patient received supine in bed and agreeable to PT. PT instructed patient in PT evaluation and initiated treatment intervention; see below for results. PT instructed patient in DGI and Berg balance test see below for results. Patient noted to have most difficulty with higher level problem solving  multistep commands throughout treatment. PT educated patient in rehab goals, and potential as well as treatment plan with verbal understanding from patient.  Patient left supine in bed with bed alarm on and call bell in reach.   PT Evaluation Precautions/Restrictions   WB restrictions: no  General   Vital SignsTherapy Vitals Temp: 98.1 F (36.7 C) Temp Source: Oral Pulse Rate: (!) 57 Resp: 18 BP: 125/85 Patient Position (if appropriate): Lying Oxygen Therapy SpO2: 98 % O2 Device: Not Delivered Pain   0/10  Home Living/Prior Functioning Home Living Available Help at Discharge: Family Type of Home: House Home Access: Stairs to enter;Ramped entrance Entrance Stairs-Number of Steps: 5 Entrance Stairs-Rails: Left;Right Home Layout: Two level Alternate Level Stairs-Number of Steps: 12-15, with bend in stairs Alternate Level Stairs-Rails: Right;Left;Can reach both Bathroom Shower/Tub: Multimedia programmer: Standard Bathroom Accessibility: Yes Additional Comments: May go home with parents   Lives With: Spouse;Son;Daughter Prior Function Level of Independence: Independent with basic ADLs;Independent with homemaking with ambulation  Able to Take Stairs?: Yes Driving: No (license expired) Vocation: Works at home Comments: Pt. performed all ADL's independently prior to admission, married and has 2 children ages 33 and 23 (32rd and 4th grade).  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 Vision/Perception     Cognition Overall Cognitive Status:  Impaired/Different from baseline Arousal/Alertness: Awake/alert Orientation Level: Oriented to person;Oriented to place;Oriented to situation;Disoriented to time Attention: Focused;Sustained Focused Attention: Appears intact Sustained Attention: Impaired Sustained Attention Impairment: Verbal basic;Functional basic Memory: Impaired Memory Impairment: Decreased recall of new information;Decreased short term memory;Storage deficit Decreased Short Term Memory: Verbal basic;Functional basic Awareness: Impaired Awareness Impairment: Intellectual impairment Problem Solving: Impaired Problem Solving Impairment: Verbal basic;Functional basic Executive Function:  (all impaired due to lower level deficits ) Organizing: Impaired Organizing Impairment: Verbal complex;Functional complex Safety/Judgment: Impaired Sensation Sensation Light Touch: Appears Intact Stereognosis: Appears Intact Hot/Cold: Appears Intact Proprioception: Appears Intact Coordination Gross Motor Movements are Fluid and Coordinated: Yes Fine Motor Movements are Fluid and Coordinated: Yes Motor  Motor Motor: Within Functional Limits  Mobility Bed Mobility Bed Mobility: Rolling Right;Rolling Left;Supine to Sit;Sit to Supine Rolling Right: 4: Min guard Rolling Left: 4: Min guard Supine to Sit: 5: Supervision Supine to Sit Details: Verbal cues for technique;Verbal cues for precautions/safety;Verbal cues for sequencing Sit to Supine: 5: Supervision Sit to Supine - Details: Verbal cues for technique;Verbal cues for precautions/safety;Verbal cues for safe use of DME/AE Transfers Transfers: Yes Sit to Stand: 5: Supervision Sit to Stand Details: Verbal cues for precautions/safety Stand to Sit: 5: Supervision Stand to Sit Details (indicate cue type and reason): Verbal cues for precautions/safety;Verbal cues for technique Stand Pivot Transfers: 5: Supervision Stand Pivot Transfer Details: Verbal cues for technique;Verbal  cues for precautions/safety Locomotion  Ambulation Ambulation: Yes Ambulation/Gait Assistance: 5: Supervision Ambulation Distance (Feet): 150 Feet Assistive device: None Ambulation/Gait Assistance Details: Verbal cues for precautions/safety Gait Gait: Yes Gait Pattern: Impaired Gait Pattern: Decreased step length - left;Decreased step length - right;Step-through pattern Gait velocity: 0.59ms (0.8 m/s = community ambulator)  Stairs / Additional Locomotion Stairs: Yes Stairs Assistance: 5: Supervision Stair Management Technique: Two rails Number of Stairs: 12 Height of Stairs: 6 Ramp: 5: Supervision Curb: 5: Supervision Wheelchair Mobility Wheelchair Mobility: No  Trunk/Postural Assessment  Cervical Assessment Cervical Assessment: Within Functional Limits Thoracic Assessment Thoracic Assessment: Within Functional Limits Lumbar Assessment Lumbar Assessment: Within Functional Limits Postural Control Postural Control: Within Functional Limits  Balance Balance Balance Assessed: Yes  Standardized Balance Assessment Standardized Balance Assessment: Berg Balance Test;Dynamic Gait Index Berg Balance Test Sit to Stand: Able to stand  independently using hands Standing Unsupported: Able to stand safely 2 minutes Sitting with Back Unsupported but Feet Supported on Floor or Stool: Able to sit safely and securely 2 minutes Stand to Sit: Sits safely with minimal use of hands Transfers: Able to transfer safely, definite need of hands Standing Unsupported with Eyes Closed: Able to stand 10 seconds safely Standing Ubsupported with Feet Together: Able to place feet together independently and stand 1 minute safely From Standing, Reach Forward with Outstretched Arm: Can reach confidently >25 cm (10") From Standing Position, Pick up Object from Floor: Able to pick up shoe, needs supervision From Standing Position, Turn to Look Behind Over each Shoulder: Looks behind one side only/other side  shows less weight shift Turn 360 Degrees: Able to turn 360 degrees safely but slowly Standing Unsupported, Alternately Place Feet on Step/Stool: Able to stand independently and safely and complete 8 steps in 20 seconds Standing Unsupported, One Foot in Front: Able to place foot tandem independently and hold 30 seconds Standing on One Leg: Able to lift leg independently and hold equal to or more than 3 seconds Total Score: 48 Dynamic Gait Index Level Surface: Normal Change in Gait Speed: Mild Impairment Gait with Horizontal Head Turns: Normal Gait with Vertical Head Turns: Mild Impairment Gait and Pivot Turn: Moderate Impairment Step Over Obstacle: Mild Impairment Step Around Obstacles: Mild Impairment Steps: Mild Impairment Total Score: 17 Static Sitting Balance Static Sitting - Balance Support: No upper extremity supported;Feet unsupported Static Sitting - Level of Assistance: 7: Independent Dynamic Sitting Balance Dynamic Sitting - Balance Support: Feet supported;No upper extremity supported Dynamic Sitting - Level of Assistance: 6: Modified independent (Device/Increase time) Static Standing Balance Static Standing - Balance Support: No upper extremity supported Static Standing - Level of Assistance: 6: Modified independent (Device/Increase time) Dynamic Standing Balance Dynamic Standing - Level of Assistance: 5: Stand by assistance (BUE unsupported)   Patient demonstrates increased fall risk as noted by score of  48 /56 on Berg Balance Scale.  (<36= high risk for falls, close to 100%; 37-45 significant >80%; 46-51 moderate >50%; 52-55 lower >25%) and 17/24 on the DGI (<19 indicates increased risk of falls)     Extremity Assessment  RUE Assessment RUE Assessment: Within Functional Limits LUE Assessment LUE Assessment: Within Functional Limits RLE Assessment RLE Assessment: Within Functional Limits LLE Assessment LLE Assessment: Within Functional Limits   See Function  Navigator for Current Functional Status.   Refer to Care Plan for Long Term Goals  Recommendations for other services: None  Discharge Criteria: Patient will be discharged from PT if patient refuses treatment 3 consecutive times without medical reason, if treatment goals not met, if there is a change in medical status, if patient makes no progress towards goals or if patient is discharged from hospital.  The above assessment, treatment plan, treatment alternatives and goals were discussed and mutually agreed upon: by patient  Lorie Phenix 08/13/2016, 2:17 PM

## 2016-08-13 NOTE — Plan of Care (Signed)
Problem: RH SAFETY Goal: RH STG ADHERE TO SAFETY PRECAUTIONS W/ASSISTANCE/DEVICE STG Adhere to Safety Precautions With mod.Assistance/Device.   Outcome: Not Progressing Pt repeatedly getting up without assistance from staff. Pt n/c with safety plan  Problem: RH COGNITION-NURSING Goal: RH STG ANTICIPATES NEEDS/CALLS FOR ASSIST W/ASSIST/CUES STG Anticipates Needs/Calls for Assist With Assistance/Cues.  Outcome: Not Progressing Getting up without assistance. Not calling for assistance

## 2016-08-13 NOTE — Evaluation (Signed)
Occupational Therapy Assessment and Plan  Patient Details  Name: Martha Lawson MRN: [id number] Date of Birth: 04-14-1981  OT Diagnosis: cognitive deficits and muscle weakness (generalized) Rehab Potential: Rehab Potential (ACUTE ONLY): Good ELOS: 7-10 days   Today's Date: 08/13/2016 OT Individual Time: 1000-1100 OT Individual Time Calculation (min): 60 min      Problem List:  Patient Active Problem List   Diagnosis Date Noted  . Anoxic brain injury ([place]) 08/12/2016  . Polysubstance abuse 08/12/2016  . Metabolic acidosis   . Elevated LFTs   . Shock liver   . Acute respiratory failure ([place]) 08/05/2016  . Unresponsive     Past Medical History:  Past Medical History:  Diagnosis Date  . Asthma    in fall due to allergies   Past Surgical History: No past surgical history on file.  Assessment & Plan Clinical Impression: Patient is a 35 y.o. year old female with history of polysubstance abuse who was using drugs and was found unresponsive at home on 08/05/16. UDS positive for cocaine, benzos, opiates and amphetamines. Patient foaming at mouth with GCS 3. She was intubated in ED and placed on pressors for neurogenic shock from cocaine toxicity with severe metabolic acidosis.  CT head negative for acute changes and neurology recommended full work up to rule out ABI.  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 Cardiology felt that boderline elevation in troponin likely due to demand ischemia.  Patient self extubated on 9/18 and noted to have foul smelling secretions positive for MRSA--antibiotics adjusted to Vancomycin--> clindamycin.   She continues to have issues with agitation, hallucinations and bouts of lethargy resolving.  Diet advanced to regular as attention and alertness improving.    Patient transferred to CIR on 08/12/2016.    Patient currently requires min with  basic self-care skills secondary to decreased attention, decreased problem solving and delayed processing.  Prior to hospitalization, patient could complete BADL and iADL independently.   Patient will benefit from skilled intervention to increase independence with basic self-care skills and increase level of independence with iADL prior to discharge home with care partner.  Anticipate patient will require intermittent supervision and follow up home health.  OT - End of Session Activity Tolerance: Tolerates 30+ min activity with multiple rests Endurance Deficit: Yes OT Assessment Rehab Potential (ACUTE ONLY): Good Barriers to Discharge: Decreased caregiver support OT Patient demonstrates impairments in the following area(s): Behavior;Cognition;Endurance;Safety OT Advanced ADL's Functional Problem(s): Simple Meal Preparation;Laundry;Light Housekeeping OT Transfers Functional Problem(s): Toilet;Tub/Shower OT Plan OT Intensity: Minimum of 1-2 x/day, 45 to 90 minutes OT Frequency: 5 out of 7 days OT Duration/Estimated Length of Stay: 7-10 days OT Treatment/Interventions: Cognitive remediation/compensation;Discharge planning;Therapeutic Exercise;Patient/family education;Psychosocial support;Therapeutic Activities OT Self Feeding Anticipated Outcome(s): I OT Basic Self-Care Anticipated Outcome(s): Mod I OT Toileting Anticipated Outcome(s): Mod I OT Bathroom Transfers Anticipated Outcome(s): Mod I OT Recommendation Recommendations for Other Services: Neuropsych consult Patient destination: Home Follow Up Recommendations: Home health OT Equipment Recommended: To be determined   Skilled Therapeutic Intervention OT 1:1 initial evaluation completed with treatment provided to address improved transfers, dynamic standing balance, role, goals and methods of OT treatment.   Pt able to complete transfers and bathing/dressing with setup, supervision and contact guard for safety.   Pt demo's delay with  processing instructions, short-term memory deficits, and impaired sustained attention to complete tasks with good thoroughness.  OT Evaluation Precautions/Restrictions  Precautions  Precautions: Fall Precaution Comments: seizure risk Restrictions Weight Bearing Restrictions: No   General Chart Reviewed: Yes Family/Caregiver Present: No   Vital Signs Therapy Vitals Temp: 98.1 F (36.7 C) Temp Source: Oral Pulse Rate: (!) 57 Resp: 18 BP: 125/85 Patient Position (if appropriate): Lying Oxygen Therapy SpO2: 98 % O2 Device: Not Delivered   Pain Pain Assessment Pain Assessment: No/denies pain   Home Living/Prior Functioning Home Living Family/patient expects to be discharged to:: Private residence Living Arrangements: Spouse/significant other Available Help at Discharge: Family Type of Home: House Home Access: Stairs to enter Technical brewer of Steps: 5 Entrance Stairs-Rails: Left, Right Home Layout: Two level Alternate Level Stairs-Number of Steps: 12-15, with bend in stairs Alternate Level Stairs-Rails: Right, Left, Can reach both Bathroom Shower/Tub: Multimedia programmer: Standard Bathroom Accessibility: Yes Additional Comments: May go home with parents   Lives With: Spouse, Son, Daughter IADL History Homemaking Responsibilities: Yes Meal Prep Responsibility: Primary Laundry Responsibility: Primary Cleaning Responsibility: Primary Bill Paying/Finance Responsibility: No Shopping Responsibility: Primary Child Care Responsibility: Primary Homemaking Comments: both children in school during daytime Current License: Yes Education: GED 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 performed all ADL's independently prior to admission, married and has 2 children ages 43 and 22 (29rd and 4th grade).  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   ADL ADL ADL Comments: See functional assessment tool   Vision/Perception  Vision- History Baseline Vision/History: No visual deficits Patient Visual Report: No change from baseline   Cognition Overall Cognitive Status: Impaired/Different from baseline Arousal/Alertness: Awake/alert Orientation Level: Person;Place;Situation Person: Oriented Place: Oriented Situation: Oriented Year: 2017 Month: September Day of Week: Incorrect Memory: Impaired Memory Impairment: Decreased recall of new information Immediate Memory Recall: Sock;Blue;Bed Memory Recall: Blue;Bed;Sock Memory Recall Sock: With Cue Memory Recall Blue: Without Cue Memory Recall Bed: Without Cue Attention: Sustained Sustained Attention: Appears intact Awareness: Impaired Awareness Impairment: Emergent impairment Problem Solving: Impaired Problem Solving Impairment: Verbal complex;Functional complex Executive Function: Writer: Impaired Organizing Impairment: Verbal complex;Functional complex Safety/Judgment: Appears intact   Sensation Sensation Light Touch: Appears Intact Stereognosis: Appears Intact Hot/Cold: Appears Intact Proprioception: Appears Intact Coordination Gross Motor Movements are Fluid and Coordinated: Yes Fine Motor Movements are Fluid and Coordinated: Yes   Motor  Motor Motor: Within Functional Limits   Mobility  Bed Mobility Bed Mobility: Rolling Right Transfers Transfers: Sit to Stand;Stand to Sit Sit to Stand: 4: Min assist Sit to Stand Details: Verbal cues for precautions/safety Stand to Sit: 5: Supervision Stand to Sit Details (indicate cue type and reason): Verbal cues for precautions/safety   Trunk/Postural Assessment  Cervical  Assessment Cervical Assessment: Within Functional Limits Thoracic Assessment Thoracic Assessment: Within Functional Limits Lumbar Assessment Lumbar Assessment: Within Functional Limits Postural Control Postural Control: Within Functional Limits   Balance Balance Balance Assessed: Yes Standardized Balance Assessment Standardized Balance Assessment: Berg Balance Test;Dynamic Gait Index Berg Balance Test Sit to Stand: Able to stand  independently using hands Standing Unsupported: Able to stand safely 2 minutes Sitting with Back Unsupported but Feet Supported on Floor or Stool: Able to sit safely and securely 2 minutes Stand to Sit: Sits safely with minimal use of hands Transfers: Able to transfer safely, definite need of  hands Standing Unsupported with Eyes Closed: Able to stand 10 seconds safely Standing Ubsupported with Feet Together: Able to place feet together independently and stand 1 minute safely From Standing, Reach Forward with Outstretched Arm: Can reach confidently >25 cm (10") From Standing Position, Pick up Object from Floor: Able to pick up shoe, needs supervision From Standing Position, Turn to Look Behind Over each Shoulder: Looks behind one side only/other side shows less weight shift Turn 360 Degrees: Able to turn 360 degrees safely but slowly Standing Unsupported, Alternately Place Feet on Step/Stool: Able to stand independently and safely and complete 8 steps in 20 seconds Standing Unsupported, One Foot in Front: Able to place foot tandem independently and hold 30 seconds Standing on One Leg: Able to lift leg independently and hold equal to or more than 3 seconds Total Score: 48 Dynamic Gait Index Level Surface: Normal Change in Gait Speed: Mild Impairment Gait with Horizontal Head Turns: Normal Gait with Vertical Head Turns: Mild Impairment Gait and Pivot Turn: Moderate Impairment Step Over Obstacle: Mild Impairment Step Around Obstacles: Mild Impairment Steps:  Mild Impairment Total Score: 17 Static Sitting Balance Static Sitting - Balance Support: No upper extremity supported;Feet unsupported Static Sitting - Level of Assistance: 7: Independent Dynamic Sitting Balance Dynamic Sitting - Balance Support: Feet supported;No upper extremity supported Dynamic Sitting - Level of Assistance: 6: Modified independent (Device/Increase time) Static Standing Balance Static Standing - Balance Support: No upper extremity supported Static Standing - Level of Assistance: 6: Modified independent (Device/Increase time) Dynamic Standing Balance Dynamic Standing - Level of Assistance: 5: Stand by assistance (BUE unsupported)   Extremity/Trunk Assessment RUE Assessment RUE Assessment: Within Functional Limits LUE Assessment LUE Assessment: Within Functional Limits  See Function Navigator for Current Functional Status.   Refer to Care Plan for Long Term Goals  Recommendations for other services: Neuropsych (SA counseling)  Discharge Criteria: Patient will be discharged from OT if patient refuses treatment 3 consecutive times without medical reason, if treatment goals not met, if there is a change in medical status, if patient makes no progress towards goals or if patient is discharged from hospital.  The above assessment, treatment plan, treatment alternatives and goals were discussed and mutually agreed upon: by patient  [facility] 08/13/2016, 4:17 PM

## 2016-08-14 ENCOUNTER — Inpatient Hospital Stay (HOSPITAL_COMMUNITY): Payer: Medicaid Other

## 2016-08-14 NOTE — Plan of Care (Signed)
Problem: RH SAFETY Goal: RH STG ADHERE TO SAFETY PRECAUTIONS W/ASSISTANCE/DEVICE STG Adhere to Safety Precautions With mod.Assistance/Device.   Outcome: Not Progressing Patient very impulsive   

## 2016-08-14 NOTE — Progress Notes (Signed)
Martha GravelJessica Z Lawson is a 35 y.o. female 1981/01/20 [id number]  Subjective: No new complaints. No new problems. She is asleep  Objective: Vital signs in last 24 hours: Temp:  [98 F (36.7 C)-98.1 F (36.7 C)] 98 F (36.7 C) (09/24 0647) Pulse Rate:  [57-65] 65 (09/24 0647) Resp:  [18] 18 (09/24 0647) BP: (125-146)/(85-88) 146/88 (09/24 0647) SpO2:  [98 %-100 %] 100 % (09/24 0647) Weight change:  Last BM Date: 08/12/16  Intake/Output from previous day: 09/23 0701 - 09/24 0700 In: 360 [P.O.:360] Out: -  Last cbgs: CBG (last 3)   Recent Labs  08/12/16 0001 08/12/16 0348 08/12/16 0754  GLUCAP 70 92 91     Physical Exam General: No apparent distress   HEENT: not dry Lungs: Normal effort. Lungs clear to auscultation, no crackles or wheezes. Cardiovascular: Regular rate and rhythm, no edema Abdomen: S/NT/ND; BS(+) Musculoskeletal:  unchanged Neurological: No new neurological deficits Wounds: N/A    Skin: clear  Mental state: Asleep    Lab Results: BMET    Component Value Date/Time   NA 139 08/13/2016 0457   K 3.6 08/13/2016 0457   CL 100 (L) 08/13/2016 0457   CO2 28 08/13/2016 0457   GLUCOSE 83 08/13/2016 0457   BUN 8 08/13/2016 0457   CREATININE 0.96 08/13/2016 0457   CALCIUM 9.1 08/13/2016 0457   GFRNONAA >60 08/13/2016 0457   GFRAA >60 08/13/2016 0457   CBC    Component Value Date/Time   WBC 8.8 08/13/2016 0457   RBC 3.59 (L) 08/13/2016 0457   HGB 10.9 (L) 08/13/2016 0457   HGB 12.2 05/22/2014 1845   HCT 32.9 (L) 08/13/2016 0457   HCT 36.0 05/22/2014 1845   PLT 202 08/13/2016 0457   PLT 262 05/22/2014 1845   MCV 91.6 08/13/2016 0457   MCV 92 05/22/2014 1845   MCH 30.4 08/13/2016 0457   MCHC 33.1 08/13/2016 0457   RDW 13.2 08/13/2016 0457   RDW 13.0 05/22/2014 1845   LYMPHSABS 2.6 08/13/2016 0457   LYMPHSABS 2.7 05/22/2014 1845   MONOABS 0.9 08/13/2016 0457   MONOABS 0.6 05/22/2014 1845   EOSABS 0.2 08/13/2016 0457   EOSABS 0.2 05/22/2014  1845   BASOSABS 0.0 08/13/2016 0457   BASOSABS 0.1 05/22/2014 1845    Studies/Results: No results found.  Medications: I have reviewed the patient's current medications.  Assessment/Plan:  1.  Cognitive, mobility and functional deficits secondary to anoxic brain injury             -admit to inpatient rehab 2.  DVT Prophylaxis/Anticoagulation: Pharmaceutical: Lovenox 3. Pain Management: tylenol prn 4. Mood: LCSW to follow for evaluation and support.  5. Neuropsych: This patient is not capable of making decisions on her own behalf.             -re-establish sleep wake cycle 6. Skin/Wound Care: routine pressure relief measures. 7. Fluids/Electrolytes/Nutrition: Monitor I/O. Intake improving. Check lytes in am. 8. Hypokalemia: Will supplement today.  Recheck labs in am.  10. Abnormal LFTs: Due to shocked liver--slowly resolving. HepA/Hep B negative. Ammonia levels WNL.  Will recheck LFTs  in am.  11. MRSA tracheitis: On clindamycin for 4 more days.  12.  Polysubstance abuse: educate on cessation/ refer to counseling.   Cont current Rx      Length of stay, days: 2  Sonda PrimesAlex Plotnikov , MD 08/14/2016, 12:27 PM

## 2016-08-14 NOTE — Progress Notes (Signed)
Occupational Therapy Session Note  Patient Details  Name: Martha GravelJessica Z Lawson MRN: [id number] Date of Birth: Mar 23, 1981  Today's Date: 08/14/2016 OT Individual Time: 1300-1330 OT Individual Time Calculation (min): 30 min   Short Term Goals: Week 1:  OT Short Term Goal 1 (Week 1): STG=LTG d/t short anticipated LOS.  Skilled Therapeutic Interventions/Progress Updates:   Therapeutic activity with focus on improved attention, awareness, memory, endurance.   Pt educated on plan for simple meal prep.  Pt ambulated with OT escort and SBA to [facility] for familiarization and orientation to appliances and cook ware available.   Pt was encouraged to plan a favorite simple meal and she selected chicken pot pie recipe which she claims her mother-in-law taught her how to make.  Patient could recall only 4 ingredients: chicken (4 breasts), frozen pie crust, chicken broth, and flour.   Pt was unable to estimate measurements and denied use of vegetables in recipe.    Pt was also unable to describe process of preparation of recipe at all and admitted to confusion; OT suggested use of available cookbooks with alternate plan to attempt recipe provided using written directions (4-steps).   OT also educated pt on staff resources Royden Purl([place], [place] for acquisition of ingredients and was escorted to copy room and to staff office to place message at desk of RT.   From staff office, pt was instructed to return to her room and she required moderate cues and extra time using signs [facility] and find her room.     Therapy Documentation Precautions:  Precautions Precautions: Fall Precaution Comments: seizure risk Restrictions Weight Bearing Restrictions: No  Vital Signs: Therapy Vitals Temp: 97.8 F (36.6 C) Temp Source: Oral Pulse Rate: 68 Resp: 18 BP: 139/90 Patient Position (if appropriate): Sitting Oxygen Therapy SpO2: 100 % O2 Device: Not Delivered   ADL: ADL ADL Comments: See  functional assessment tool   See Function Navigator for Current Functional Status.   Therapy/Group: Individual Therapy  Martha Lawson 08/14/2016, 3:38 PM

## 2016-08-15 ENCOUNTER — Inpatient Hospital Stay (HOSPITAL_COMMUNITY): Payer: Medicaid Other | Admitting: Speech Pathology

## 2016-08-15 ENCOUNTER — Inpatient Hospital Stay (HOSPITAL_COMMUNITY): Payer: Self-pay | Admitting: Physical Therapy

## 2016-08-15 ENCOUNTER — Inpatient Hospital Stay (HOSPITAL_COMMUNITY): Payer: Medicaid Other | Admitting: Occupational Therapy

## 2016-08-15 DIAGNOSIS — J0410 Acute tracheitis without obstruction: Secondary | ICD-10-CM

## 2016-08-15 DIAGNOSIS — R41840 Attention and concentration deficit: Secondary | ICD-10-CM

## 2016-08-15 DIAGNOSIS — R638 Other symptoms and signs concerning food and fluid intake: Secondary | ICD-10-CM | POA: Insufficient documentation

## 2016-08-15 DIAGNOSIS — D62 Acute posthemorrhagic anemia: Secondary | ICD-10-CM

## 2016-08-15 LAB — COMPREHENSIVE METABOLIC PANEL
ALBUMIN: 3.5 g/dL (ref 3.5–5.0)
ALK PHOS: 34 U/L — AB (ref 38–126)
ALT: 360 U/L — ABNORMAL HIGH (ref 14–54)
ANION GAP: 7 (ref 5–15)
AST: 61 U/L — ABNORMAL HIGH (ref 15–41)
BUN: 19 mg/dL (ref 6–20)
CHLORIDE: 101 mmol/L (ref 101–111)
CO2: 29 mmol/L (ref 22–32)
Calcium: 9.2 mg/dL (ref 8.9–10.3)
Creatinine, Ser: 1.01 mg/dL — ABNORMAL HIGH (ref 0.44–1.00)
GFR calc non Af Amer: >60 mL/min (ref >60)
GLUCOSE: 98 mg/dL (ref 65–99)
POTASSIUM: 4 mmol/L (ref 3.5–5.1)
SODIUM: 137 mmol/L (ref 135–145)
Total Bilirubin: 0.7 mg/dL (ref 0.3–1.2)
Total Protein: 6.9 g/dL (ref 6.5–8.1)

## 2016-08-15 NOTE — Plan of Care (Signed)
Problem: RH SAFETY Goal: RH STG ADHERE TO SAFETY PRECAUTIONS W/ASSISTANCE/DEVICE STG Adhere to Safety Precautions With mod.Assistance/Device.   Outcome: Not Progressing Patient very impulsive

## 2016-08-15 NOTE — Progress Notes (Signed)
[facility]     PROGRESS NOTE  Subjective/Complaints:  Pt seen sitting up in bed this AM.  She states she had a good weekend and is ready to start the regular week.   ROS: Denies CP, SOB, N/V/D.  Objective: Vital Signs: Blood pressure 131/84, pulse 66, temperature 98 F (36.7 C), temperature source Oral, resp. rate 18, height 5\' 2"  (1.575 m), SpO2 99 %. No results found.  Recent Labs  08/13/16 0457  WBC 8.8  HGB 10.9*  HCT 32.9*  PLT 202    Recent Labs  08/13/16 0457 08/15/16 0612  NA 139 137  K 3.6 4.0  CL 100* 101  GLUCOSE 83 98  BUN 8 19  CREATININE 0.96 1.01*  CALCIUM 9.1 9.2   CBG (last 3)  No results for input(s): GLUCAP in the last 72 hours.  Wt Readings from Last 3 Encounters:  08/12/16 103.8 kg (228 lb 12.8 oz)    Physical Exam:  BP 131/84 (BP Location: Left Arm)   Pulse 66   Temp 98 F (36.7 C) (Oral)   Resp 18   Ht 5\' 2"  (1.575 m)   LMP  (LMP Unknown) Comment: neg preg test  SpO2 99%   BMI 41.85 kg/m  Constitutional: She appears well-developed. Obese. NAD HENT: Normocephalic and atraumatic.  Eyes: Conjunctivae and EOM are normal.  Cardiovascular: Normal rate and regular rhythm. No murmur heard. Respiratory: Effort normal and breath sounds normal. No respiratory distress. She has no wheezes.  GI: Soft. Bowel sounds are normal. She exhibits no distension. There is no tenderness.  Musculoskeletal: Normal range of motion. No tenderness.  Neurological: She is alert and oriented. No cranial nerve deficit.  Follows commands  Motor: 4-4+/5 throughout Problems with attention/concentration.  Skin: Skin is warm and dry.  Psychiatric: Her speech is normal. Her affect is blunt. She is slowed.    Assessment/Plan: 1. Functional deficits secondary to anoxic brain injury which require 3+ hours per day of interdisciplinary therapy in a comprehensive inpatient rehab setting. Physiatrist is providing close team supervision  and 24 hour management of active medical problems listed below. Physiatrist and rehab team continue to assess barriers to discharge/monitor patient progress toward functional and medical goals.  Function:  Bathing Bathing position      Bathing parts      Bathing assist        Upper Body Dressing/Undressing Upper body dressing                    Upper body assist        Lower Body Dressing/Undressing Lower body dressing                                  Lower body assist        Toileting Toileting   Toileting steps completed by patient: Adjust clothing prior to toileting, Performs perineal hygiene, Adjust clothing after toileting   Toileting Assistive Devices: Grab bar or rail  Toileting assist Assist level: Supervision or verbal cues   Transfers Chair/bed transfer   Chair/bed transfer method: Ambulatory Chair/bed transfer assist level: Supervision or verbal cues       Locomotion Ambulation     Max distance: 130ft  Assist level: Supervision or verbal cues   Wheelchair Wheelchair activity did not occur: N/A        Cognition Comprehension Comprehension assist level: Follows basic conversation/direction with  extra time/assistive device  Expression Expression assist level: Expresses basic needs/ideas: With no assist  Social Interaction Social Interaction assist level: Interacts appropriately 50 - 74% of the time - May be physically or verbally inappropriate.  Problem Solving Problem solving assist level: Solves basic 50 - 74% of the time/requires cueing 25 - 49% of the time  Memory Memory assist level: Recognizes or recalls 50 - 74% of the time/requires cueing 25 - 49% of the time     Medical Problem List and Plan: 1.  Cognitive, mobility and functional deficits secondary to anoxic brain injury             -Cont CIR  -Will consider ritalin after discussion with therapies. 2.  DVT Prophylaxis/Anticoagulation: Pharmaceutical: Lovenox 3. Pain  Management: tylenol prn 4. Mood: LCSW to follow for evaluation and support.  5. Neuropsych: This patient is not capable of making decisions on her own behalf. 6. Skin/Wound Care: routine pressure relief measures. 7. Fluids/Electrolytes/Nutrition: Monitor I/O. Intake improving.   Encourage fluid 8. Hypokalemia: Resolved 10. Abnormal LFTs: Due to shocked liver: Improving.   HepA/Hep B negative. Ammonia levels WNL.   11. MRSA tracheitis: On clindamycin for 1 more day.  12.  Polysubstance abuse: educate on cessation/ refer to counseling.  13. ABLA  Hb 10.9 on 9/23  Will cont to monitor  LOS (Days) 3 A FACE TO FACE EVALUATION WAS PERFORMED  Ankit [name] Patel 08/15/2016 8:31 AM

## 2016-08-15 NOTE — Progress Notes (Signed)
Occupational Therapy Session Note  Patient Details  Name: Martha GravelJessica Z Lawson MRN: [id number] Date of Birth: 1981/04/11  Today's Date: 08/15/2016 OT Individual Time: [id number] OT Individual Time Calculation (min): 56 min     Short Term Goals: Week 1:  OT Short Term Goal 1 (Week 1): STG=LTG d/t short anticipated LOS.  Skilled Therapeutic Interventions/Progress Updates:    Upon entering the room, pt supine in bed sleeping with husband present in room. Pt agreeable to OT intervention but declined bathing and dressing this session. Pt did ambulated to dresser in order to obtain jacket with close supervision and without AD. Pt standing at sink and performed grooming tasks with min verbal cues and increased time for initiation of task. Pt ambulated 150' to day room with close supervision for meal. Pt needing supervision and min cues for safe swallowing/eating strategies. Pt required mod multimodal cues in order to navigate back to her room. Pt returned to sitting on bed with call bell within reach and bed alarm activated.   Therapy Documentation Precautions:  Precautions Precautions: Fall Precaution Comments: seizure risk Restrictions Weight Bearing Restrictions: No General:   Vital Signs: Therapy Vitals Temp: 98 F (36.7 C) Temp Source: Oral Pulse Rate: 66 Resp: 18 BP: 131/84 Patient Position (if appropriate): Lying Oxygen Therapy SpO2: 99 % O2 Device: Not Delivered Pain:   ADL: ADL ADL Comments: See functional assessment tool Exercises:   Other Treatments:    See Function Navigator for Current Functional Status.   Therapy/Group: Individual Therapy  Lowella Gripittman, Kail Fraley L 08/15/2016, 8:00 AM

## 2016-08-15 NOTE — IPOC Note (Addendum)
Overall Plan of Care [facility](IPOC) Patient Details Name: Martha GravelJessica Z Lawson MRN: [id number] DOB: Mar 21, 1981  Admitting Diagnosis: anoxic   Hospital Problems: Principal Problem:   Anoxic brain injury (HCC) Active Problems:   Shock liver   Polysubstance abuse   Tracheitis   Acute blood loss anemia   Poor fluid intake   Attention and concentration deficit     Functional Problem List: Nursing Endurance, Perception, Safety  PT Balance, Behavior, Endurance, Safety  OT Behavior, Cognition, Endurance, Safety  SLP Cognition  TR         Basic ADL's: OT       Advanced  ADL's: OT Simple Meal Preparation, Laundry, Light Housekeeping     Transfers: PT Bed Mobility, Bed to Chair, Car, Furniture, Floor, Other (comment)  OT Toilet, Tub/Shower     Locomotion: PT Ambulation, Psychologist, prison and probation servicesWheelchair Mobility, Stairs     Additional Impairments: OT    SLP Swallowing, Communication, Social Cognition comprehension, expression Social Interaction, Problem Solving, Memory, Attention, Awareness  TR      Anticipated Outcomes Item Anticipated Outcome  Self Feeding I  Swallowing  Mod I   Basic self-care  Mod I  Toileting  Mod I   Bathroom Transfers Mod I  Bowel/Bladder  Continent to bowel and bladder with min. assist.  Transfers  mod I   Locomotion  Mod I   Communication  Supervision  Cognition  Min A  Pain  Less than 3,on 1 to 10 scale.  Safety/Judgment  Free from falls during her stay in rehab.   Therapy Plan: PT Intensity: Minimum of 1-2 x/day ,45 to 90 minutes PT Frequency: 5 out of 7 days PT Duration Estimated Length of Stay: 7-10 days  OT Intensity: Minimum of 1-2 x/day, 45 to 90 minutes OT Frequency: 5 out of 7 days OT Duration/Estimated Length of Stay: 7-10 days SLP Intensity: Minumum of 1-2 x/day, 30 to 90 minutes SLP Frequency: 3 to 5 out of 7 days SLP Duration/Estimated Length of Stay: 7-10 days        Team Interventions: Nursing Interventions Patient/Family Education,  Disease Management/Prevention, Psychosocial Support, Discharge Planning  PT interventions Ambulation/gait training, Warden/rangerBalance/vestibular training, Cognitive remediation/compensation, Community reintegration, Discharge planning, DME/adaptive equipment instruction, Functional mobility training, Neuromuscular re-education, Patient/family education, Psychosocial support, Skin care/wound management, Stair training, Therapeutic Activities, Therapeutic Exercise, Visual/perceptual remediation/compensation, UE/LE Strength taining/ROM, UE/LE Coordination activities, Wheelchair propulsion/positioning  OT Interventions Cognitive remediation/compensation, Discharge planning, Therapeutic Exercise, Patient/family education, Psychosocial support, Therapeutic Activities  SLP Interventions Cognitive remediation/compensation, Cueing hierarchy, Functional tasks, Patient/family education, Environmental controls, Internal/external aids, Speech/Language facilitation, Therapeutic Activities  TR Interventions    SW/CM Interventions Discharge Planning, Psychosocial Support, Patient/Family Education    Team Discharge Planning: Destination: PT-Home ,OT- Home , SLP-Home Projected Follow-up: PT-Outpatient PT, OT-  Home health OT, SLP-24 hour supervision/assistance, Outpatient SLP, Home Health SLP Projected Equipment Needs: PT-To be determined, OT- To be determined, SLP-None recommended by SLP Equipment Details: PT- , OT-  Patient/family involved in discharge planning: PT- Patient,  OT-Patient, SLP-Patient  MD ELOS: 7-10 days. Medical Rehab Prognosis:  Good Assessment: 35 y.o.femalewith history of polysubstance abuse who was using drugs and was found unresponsive at home on 08/05/16. UDS positive for cocaine, benzos, opiates and amphetamines. Patient foaming at mouth with GCS 3. She was intubated in ED and placed on pressors for neurogenic shock from cocaine toxicity with severe metabolic acidosis.  CT head negative for acute  changes and neurology recommended full work up to rule out ABI.  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 Cardiology felt that boderline elevation in troponin likely due to demand ischemia.  Patient self extubated on 9/18 and noted to have foul smelling secretions positive for MRSA--antibiotics adjusted to Vancomycin--> clindamycin.   Issues with agitation, hallucinations and bouts of lethargy resolving. Diet advanced to regular as attention and alertness improving. Pt with resulting deficits with endurance, balance, and cognition.  Will set goals for Mod I with PT/OT and Min A for cognition with SLP.   See Team Conference Notes for weekly updates to the plan of care

## 2016-08-15 NOTE — Progress Notes (Signed)
Speech Language Pathology Daily Session Note  Patient Details  Name: Martha GravelJessica Z Lawson MRN: [id number] Date of Birth: 05-02-1981  Today's Date: 08/15/2016 SLP Individual Time: 0830-0930 SLP Individual Time Calculation (min): 60 min   Short Term Goals: Week 1: SLP Short Term Goal 1 (Week 1): Patient will consume current diet with minimal overt s/s of aspiration with Mod I for use of swallowing compensatory strategies.  SLP Short Term Goal 2 (Week 1): Patient will utilize an increased vocal intensity at the phrase level with Min A verbal cues to achieve 75% intelligibility.  SLP Short Term Goal 3 (Week 1): Patient will initiate functional tasks with Min A verbal cues.  SLP Short Term Goal 4 (Week 1): Patient will demonstrate sustained attention to tasks for ~5 minutes with Mod A verbal cues for redirection.  SLP Short Term Goal 5 (Week 1): Patient will demonstrate functional problem solving for basic and familiar tasks with Mod A verbal cues.  SLP Short Term Goal 6 (Week 1): Patient will recall new, daily information with Mod A verbal cues for use of external aids.   Skilled Therapeutic Interventions:Skilled therapy intervention focused on cognitive goals. Patient required Max A verbal and visual cues to complete a functional money task targeting problem solving, attention, recall, and initiation. Patient's affect was flat except when given nonverbal context cue by other people in the room. Given a partnered visuospatial activity, patient was able to appropriately initiate with Mod A visual and verbal cues. Patient's low vocal intensity impacted intelligibility at the phrase level. Patient left supine in bed with family member in room and all needs within reach. Continue current plan of care.    Function:  Cognition Comprehension Comprehension assist level: Understands basic 90% of the time/cues < 10% of the time  Expression   Expression assist level: Expresses basic 90% of the time/requires  cueing < 10% of the time.  Social Interaction Social Interaction assist level: Interacts appropriately 25 - 49% of time - Needs frequent redirection.  Problem Solving Problem solving assist level: Solves basic 25 - 49% of the time - needs direction more than half the time to initiate, plan or complete simple activities  Memory Memory assist level: Recognizes or recalls 25 - 49% of the time/requires cueing 50 - 75% of the time    Pain Pain Assessment Pain Assessment: No/denies pain  Therapy/Group: Individual Therapy  Caryn Sectionlison Stephanine Reas 08/15/2016, 3:45 PM

## 2016-08-15 NOTE — Progress Notes (Signed)
Pt remained impulsive throughout the shift, setting off  bed alarm and attempting to ambulate without staff's assistance. Pt reminded to call staff for assistance, however, continued to remain impulsive. Pt did use bed alarm x 1 today. Staff to continue to monitor and remind pt to call.

## 2016-08-15 NOTE — Progress Notes (Signed)
Stopped by to see patient twice in response to consult without success. Will return tomorrow.   08/15/16 1700  Clinical Encounter Type  Visited With Patient not available  Visit Type Initial  Referral From Nurse  Spiritual Encounters  Spiritual Needs Other (Comment)  Stress Factors  Patient Stress Factors Not reviewed

## 2016-08-15 NOTE — Progress Notes (Signed)
Social Work  Social Work Assessment and Plan  Patient Details  Name: Martha Lawson MRN: [id number] Date of Birth: 1981/04/09  Today's Date: 08/15/2016  Problem List:  Patient Active Problem List   Diagnosis Date Noted  . Tracheitis   . Acute blood loss anemia   . Poor fluid intake   . Attention and concentration deficit   . Anoxic brain injury ([place]) 08/12/2016  . Polysubstance abuse 08/12/2016  . Metabolic acidosis   . Elevated LFTs   . Shock liver   . Acute respiratory failure ([place]) 08/05/2016  . Unresponsive    Past Medical History:  Past Medical History:  Diagnosis Date  . Asthma    in fall due to allergies   Past Surgical History: No past surgical history on file. Social History:  reports that she has been smoking.  She uses smokeless tobacco. She reports that she drinks alcohol. She reports that she uses drugs.  Family / Support Systems Marital Status: Married How Long?: 11 yrs Patient Roles: Spouse, Parent Spouse/Significant Other: spouse, Martha Lawson @ [phone] Children: they have two children ages 11 and 53 who have been staying mostly with spouse's parents since mother hospitalized Other Supports: pt's parents:  Martha Lawson @ (C) [phone] and Martha Lawson @ [phone]; pt's sister, Martha Lawson (local) Anticipated Caregiver: spouse reports that he is to be the primary caregiver and will have assistance from pt's parents Ability/Limitations of Caregiver: husband does work but notes pt's parents can assist when he is at work. Caregiver Availability: 24/7 Family Dynamics: There does appear to be some strained relationship between pt's parents and her spouse, per pt's mother.  Later, when speaking with pt's spouse,  he describes a good relationship with pt's family and open to their support.  Pt offers no comment.  Social History Preferred language: English Religion: [employer] Cultural Background: NA Education: HS Read: Yes Write: Yes Employment Status:  Unemployed Date Retired/Disabled/Unemployed: Pt and husband vague about how long she has not worked. Legal Hisotry/Current Legal Issues: None Guardian/Conservator: None - per MD, pt is not capable of making decisions on her own behalf- defer to spouse.  Mother aware of legal "next-of-kin" levels.   Abuse/Neglect Physical Abuse: Denies Verbal Abuse: Denies Sexual Abuse: Denies Exploitation of patient/patient's resources: Denies Self-Neglect: Denies  Emotional Status Pt's affect, behavior adn adjustment status: Pt lying in bed and very soft-spoken.  Very difficult to engage and offers only brief, 1-2 word answers.   Attempted to push her on cause of her hospitalization.  She does state that she "overdosed" and nods "yes" when asked if she understands that she suffed an anoxic injury.  She does not appear in any emotional distress, howeve,r very poor appreciaation of her situation.  When questioned about how she feels about returning home, she sates, "nervous".  Referring for neuropsychology consult to further determine readiness for formal drug intervention, cognitive impairment. Recent Psychosocial Issues: Per mother, she alledges that pt has been "contolled" by spouse.  Cannot confirm with pt.  She does not offer response when questioned. Pyschiatric History: None Substance Abuse History: Pt admits to "overdose" and to use of multiple drugs.  She and mother report that she has never received any counseling for her drug abuse.    Patient / Family Perceptions, Expectations & Goals Pt/Family understanding of illness & functional limitations: As noted, pt able to confirm that she had an "overdose" and with limited appreciation that she suffered an ABI.  Spouse and mother appear to  have a very limited understanding of her medical issues/ cognitive deficits. Premorbid pt/family roles/activities: pt completely independent Anticipated changes in roles/activities/participation: Pt will now require  supervision.  Spouse reports he will assume primary caregiver role. Pt/family expectations/goals: "I don't know." per pt  [employer]: None Premorbid Home Care/DME Agencies: None Transportation available at discharge: yes Resource referrals recommended: Neuropsychology  Discharge Planning Living Arrangements: Spouse/significant other, Children Support Systems: Spouse/significant other, Parent, Other relatives Type of Residence: Private residence Insurance Resources: Teacher, adult education Screen Referred: Previously completed (MAF application pending) Living Expenses: Rent Money Management: Spouse Does the patient have any problems obtaining your medications?: Yes (Describe) (no insurance) Home Management: pt and spouse Patient/Family Preliminary Plans: Pt to return home with spouse. Parents able to assist in providing 24/7 supervision. Social Work Anticipated Follow Up Needs: HH/OP, Other (comment) (drug abuse counseling.) Expected length of stay: 5-7 days  Clinical Impression Very unfortunate woman here following a drug overdose and resultant anoxic injury.  Per pt's mother, there appears to be some level of tension between pt's family and her spouse.  Anticipate very short LOS, therefore, will have neuropsychology consult to advise on SA follow up.  Will address d/c planning issues as appropriate given family stressors.  Martha Lawson 08/15/2016, 5:41 PM

## 2016-08-15 NOTE — Progress Notes (Signed)
Physical Therapy Session Note  Patient Details  Name: Martha Lawson MRN: [id number] Date of Birth: [birth year]  Today's Date: 08/15/2016 PT Individual Time: [id number] PT Individual Time Calculation (min): 65 min    Short Term Goals: Week 1:  PT Short Term Goal 1 (Week 1): = LTG due to short LOS  Skilled Therapeutic Interventions/Progress Updates:  Pt received seated EOB with mother present but mother stepped out to talk with Child psychotherapistsocial worker.  Reviewed with pt PLOF, D/C plan, home set up, assistance available at D/C, her goals and her perception of her impairments.  Pt states she does not feel anything is different.  When pt donned shoes pt donned them incorrectly on wrong feet but was not aware and did not self-correct; halfway through session therapist questioned pt about shoes and pt did acknowledge discomfort but required cues to problem solve that shoes were on incorrectly.  In gym performed stair negotiation training with pt performing up/down 12 stairs x 2 reps with first rep pt using rail and performing with step over step sequencing and then cued to remove UE support for increased challenge with pt changing to step-to sequencing with supervision.  Performed re-assessment of DGI with therapist verbalizing all tasks to pt, performing practice lap with pt and then having pt verbalize sequencing of assessment; during actual assessment pt required 50% cues to recall, initiate and sequence each task in the assessment-two extra tasks added including retro stepping and walking forwards with eyes closed.  Pt with increase in DGI to 19 today; discussed areas of impairments including decreased gait velocity and risk for falling.  Pt also educated on floor > furniture transfer and discussed indications for calling EMS.  Pt returned demonstrated floor > furniture with min A.  Focus of rest of session on dynamic gait, gait velocity, dual tasking, memory and attention training during gait with soccer ball  dribble and passes, forwards and retro gait with head turns to locate sticky notes and order months of year in order with supervision with increased difficulty with memory and sequencing with retro gait.  Pt returned to room and left EOB with mother present; mother safe to assist pt with transfers and gait into bathroom but pt to have bed alarm on when mother not in the room.    Therapy Documentation Precautions:  Precautions Precautions: Fall Precaution Comments: seizure risk Restrictions Weight Bearing Restrictions: No Vital Signs: Therapy Vitals Temp: 98 F (36.7 C) Temp Source: Oral Pulse Rate: 65 Resp: 20 BP: 125/79 Patient Position (if appropriate): Lying Oxygen Therapy SpO2: 100 % O2 Device: Not Delivered Pain: Pain Assessment Pain Assessment: No/denies pain   See Function Navigator for Current Functional Status.   Therapy/Group: Individual Therapy  Edman CircleHall, Zyona Pettaway [facility] 08/15/2016, 4:48 PM

## 2016-08-15 NOTE — Progress Notes (Signed)
Patient information reviewed and entered into eRehab system by Brittnay Pigman, RN, CRRN, PPS Coordinator.  Information including medical coding and functional independence measure will be reviewed and updated through discharge.    

## 2016-08-15 NOTE — Care Management Note (Signed)
[facility] Individual Statement of Services  Patient Name:  Martha GravelJessica Z Lawson  Date:  08/15/2016  Welcome to the [facility].  Our goal is to provide you with an individualized program based on your diagnosis and situation, designed to meet your specific needs.  With this comprehensive rehabilitation program, you will be expected to participate in at least 3 hours of rehabilitation therapies Monday-Friday, with modified therapy programming on the weekends.  Your rehabilitation program will include the following services:  Physical Therapy (PT), Occupational Therapy (OT), Speech Therapy (ST), 24 hour per day rehabilitation nursing, Therapeutic Recreaction (TR), Neuropsychology, Case Management (Social Worker), Rehabilitation Medicine, Nutrition Services and Pharmacy Services  Weekly team conferences will be held on Wednesdays to discuss your progress.  Your Social Worker will talk with you frequently to get your input and to update you on team discussions.  Team conferences with you and your family in attendance may also be held.  Expected length of stay: 5-6 days  Overall anticipated outcome: supervision  Depending on your progress and recovery, your program may change. Your Social Worker will coordinate services and will keep you informed of any changes. Your Social Worker's name and contact numbers are listed  below.  The following services may also be recommended but are not provided by the [facility]:   Driving Evaluations  Home Health Rehabiltiation Services  Outpatient Rehabilitation Services  Vocational Rehabilitation   Arrangements will be made to provide these services after discharge if needed.  Arrangements include referral to agencies that provide these services.  Your insurance has been verified to be:  None (Med[employer] application pending)  Your primary doctor is:  None  Pertinent information will be shared with  your doctor and your insurance company.  Social Worker:  DurhamLucy Walton [place], [place] [phone] or ([phone]) [phone]   Information discussed with and copy given to patient by: Amada JupiterHOYLE, Islah Eve, 08/15/2016, 4:01 PM

## 2016-08-15 NOTE — Plan of Care (Signed)
Problem: RH PAIN MANAGEMENT Goal: RH STG PAIN MANAGED AT OR BELOW PT'S PAIN GOAL <3 Outcome: Progressing No c/o pain     

## 2016-08-16 ENCOUNTER — Inpatient Hospital Stay (HOSPITAL_COMMUNITY): Payer: Medicaid Other | Admitting: Speech Pathology

## 2016-08-16 ENCOUNTER — Inpatient Hospital Stay (HOSPITAL_COMMUNITY): Payer: Medicaid Other | Admitting: Occupational Therapy

## 2016-08-16 ENCOUNTER — Inpatient Hospital Stay (HOSPITAL_COMMUNITY): Payer: Self-pay | Admitting: Physical Therapy

## 2016-08-16 MED ORDER — METHYLPHENIDATE HCL 5 MG PO TABS
5.0000 mg | ORAL_TABLET | Freq: Two times a day (BID) | ORAL | Status: DC
  Administered 2016-08-16 – 2016-08-18 (×4): 5 mg via ORAL
  Filled 2016-08-16 (×4): qty 1

## 2016-08-16 NOTE — Progress Notes (Signed)
Occupational Therapy Session Note  Patient Details  Name: Marina GravelJessica Z Dambrosia MRN: [id number] Date of Birth: 08-12-1981  Today's Date: 08/16/2016 OT Individual Time: 1101-1155 and 1530 - 1558 OT Individual Time Calculation (min): 54 min and 28 min     Short Term Goals: Week 1:  OT Short Term Goal 1 (Week 1): STG=LTG d/t short anticipated LOS.  Skilled Therapeutic Interventions/Progress Updates:     Session 1: Upon entering the room, pt supine in bed but agreeable to OT intervention. Pt with no c/o pain this session. Pt ambulating in room without use of AD in order to obtain clothing items for self care task with supervision. Pt required mod verbal cues to problem solve all needed items for task. Pt engaged in bathing while standing in shower with close supervision for safety. Pt also needing close supervision during dressing as she completed task while standing. Pt performed grooming while standing at sink and then sitting down to work on matted hair. Pt laying back down at end of session with bed alarm activated. Call bell and all needed items within reach.   Session 2: Upon entering the room, pt sleeping in bed but agreeable to OT intervention. Pt declined exiting the room this session. Pt performed supine >sit with supervision. While seated on EOB, pt engaged in familiar card game. Pt able to verbalize instructions with min verbal guidance. Pt attending to task with min cues as she passed cards out and did not require cues for turn taking. Pt returning to bed at end of session and bed alarm activated. Call bell and all needed items within reach.   Therapy Documentation Precautions:  Precautions Precautions: Fall Precaution Comments: seizure risk Restrictions Weight Bearing Restrictions: No ADL: ADL ADL Comments: See functional assessment tool  See Function Navigator for Current Functional Status.   Therapy/Group: Individual Therapy  Lowella Gripittman, Bettye Sitton L 08/16/2016, 12:44 PM

## 2016-08-16 NOTE — Progress Notes (Signed)
Speech Language Pathology Daily Session Note  Patient Details  Name: Martha GravelJessica Z Lawson MRN: [id number] Date of Birth: [birth year]  Today's Date: 08/16/2016 SLP Individual Time: [id number] SLP Individual Time Calculation (min): 57 min   Short Term Goals: Week 1: SLP Short Term Goal 1 (Week 1): Patient will consume current diet with minimal overt s/s of aspiration with Mod I for use of swallowing compensatory strategies.  SLP Short Term Goal 2 (Week 1): Patient will utilize an increased vocal intensity at the phrase level with Min A verbal cues to achieve 75% intelligibility.  SLP Short Term Goal 3 (Week 1): Patient will initiate functional tasks with Min A verbal cues.  SLP Short Term Goal 4 (Week 1): Patient will demonstrate sustained attention to tasks for ~5 minutes with Mod A verbal cues for redirection.  SLP Short Term Goal 5 (Week 1): Patient will demonstrate functional problem solving for basic and familiar tasks with Mod A verbal cues.  SLP Short Term Goal 6 (Week 1): Patient will recall new, daily information with Mod A verbal cues for use of external aids.   Skilled Therapeutic Interventions:Skilled therapy intervention focused on cognitive goals, primarily initiation. With the exception of engagement in overlearned grooming tasks, patient maintained selective attention for a maximum of 30 second intervals without verbal or question cueing. While engaged in a recipe task for functional sequencing, patient required Mod A verbal cues for selective attention to task. Patient required Max A verbal and question cues to complete a functional event planning task. After eating her morning meal, patient presented with frequent throat clearing, citing phlegm from seasonal allergies as possible reason. Therapist f/u with nurse confirmed vitals WFL and lungs clear to ascultation. Patient left upright in bed with bed alarm on and all needs within reach. Continue current plan of care.     Function:  Cognition Comprehension Comprehension assist level: Understands basic 90% of the time/cues < 10% of the time  Expression   Expression assist level: Expresses basic 90% of the time/requires cueing < 10% of the time.  Social Interaction Social Interaction assist level: Interacts appropriately 50 - 74% of the time - May be physically or verbally inappropriate.  Problem Solving Problem solving assist level: Solves basic 25 - 49% of the time - needs direction more than half the time to initiate, plan or complete simple activities  Memory Memory assist level: Recognizes or recalls 50 - 74% of the time/requires cueing 25 - 49% of the time    Pain Pain Assessment Pain Assessment: No/denies pain  Therapy/Group: Individual Therapy  Caryn Sectionlison Bralynn Donado 08/16/2016, 2:31 PM

## 2016-08-16 NOTE — Progress Notes (Signed)
Physical Therapy Session Note  Patient Details  Name: Martha GravelJessica Z Lawson MRN: [id number] Date of Birth: 1981-10-11  Today's Date: 08/16/2016 PT Individual Time: 1312-1402 PT Individual Time Calculation (min): 50 min   Short Term Goals: Week 1:  PT Short Term Goal 1 (Week 1): = LTG due to short LOS  Skilled Therapeutic Interventions/Progress Updates:  Pt received in bed; no c/o pain.  Pt agreeable to go outside.  Pt performed supine > sit and transfers with supervision.  Pt sat EOB and donned shoes on correct feet today.  Pt engaged in cognitive activities during community mobility including use of external cues to determine date, time to sign out and in, navigation to and from atrium and use of elevators with min cues, gait outside on uneven pavement, inclines, mulch, grass, up/down curbs and stairs  >1,000 with supervision and one seated rest break.  Also performed memory task of recalling 5 items and locating items in gift shop with min cuing.  Also performed standing balance on compliant surface with narrow BOS with min A for balance while engaging in visual-perceptual, sequencing and problem solving moderately complex pipe tree pattern with min cues for correct pieces and sequencing.  Returned to room and to EOB with supervision.  Discussed with pt possible D/C date and recommendation for f/u therapy.  Pt agreeable to D/C date.  Pt left with NT present to assess vitals and set up pt in bed with bed alarm.    Therapy Documentation Precautions:  Precautions Precautions: Fall Precaution Comments: seizure risk Restrictions Weight Bearing Restrictions: No Vital Signs: Therapy Vitals Temp: 97.8 F (36.6 C) Temp Source: Oral Pulse Rate: 61 Resp: 18 BP: 121/73 Patient Position (if appropriate): Lying Oxygen Therapy SpO2: 99 % O2 Device: Not Delivered Pain: Pain Assessment Pain Assessment: No/denies pain   See Function Navigator for Current Functional Status.   Therapy/Group:  Individual Therapy  Edman CircleHall, [facility] 08/16/2016, 4:13 PM

## 2016-08-16 NOTE — Evaluation (Signed)
Recreational Therapy Assessment and Plan  Patient Details  Name: Martha Lawson MRN: [id number] Date of Birth: February 20, 1981 Today's Date: 08/16/2016  Rehab Potential:  Good  ELOS:  5 days  Assessment Clinical Impression: Problem List:      Patient Active Problem List   Diagnosis Date Noted  . Anoxic brain injury ([place]) 08/12/2016  . Polysubstance abuse 08/12/2016  . Metabolic acidosis   . Elevated LFTs   . Shock liver   . Acute respiratory failure ([place]) 08/05/2016  . Unresponsive     Past Medical History:      Past Medical History:  Diagnosis Date  . Asthma    in fall due to allergies   Past Surgical History: No past surgical history on file.  Assessment & Plan Clinical Impression: Patient is a 35 y.o.femalewith history of polysubstance abuse who was using drugs andwas found unresponsive at home on 08/05/16. UDS positive for cocaine, benzos, opiates and amphetamines. Patient foaming at mouth with GCS 3. She was intubated in ED and placed on pressors for neurogenic shock from cocaine toxicity with severe metabolic acidosis. CT head negative for acute changes and neurology recommended full work up to rule out ABI. 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 Cardiology felt that boderline elevation in troponin likely due to demand ischemia. Patient self extubated on 9/18 and noted to have foul smelling secretions positive for MRSA--antibiotics adjusted to Vancomycin-->clindamycin. She continues to have issues with agitation, hallucinations and bouts of lethargy resolving. Diet advanced to regular as attention and alertness improving.  Patient transferred to CIR on 08/12/2016.   Pt presents with decreased activity tolerance, decreased functional mobility, decreased balance Limiting pt's independence with leisure/community pursuits.  Met with pt to discuss  the importance of staying active and engaged in healthy leisure activities, activity analysis with potential modifications, discharge planning & community pursuits.  Pt scheduled for discharge home tomorrow, no further TR.  Leisure History/Participation Premorbid leisure interest/current participation: Crafts - Other (Comment);Community - Doctor, hospital - Grocery store;Community - Travel (Comment);Community - Theater/Cinema Expression Interests: Music (Comment) Other Leisure Interests: Television;Movies Leisure Participation Style: With Family/Friends Awareness of Community Resources: Good-identify 3 post discharge leisure resources Psychosocial / Spiritual Social interaction - Mood/Behavior: Cooperative Academic librarian Appropriate for Education?: Yes Recreational Therapy Orientation Orientation -Reviewed with patient: Available activity resources Strengths/Weaknesses Patient Strengths/Abilities: Willingness to participate;Active premorbidly Patient weaknesses: Physical limitations TR Patient demonstrates impairments in the following area(s): Endurance;Safety;Perception The above assessment, treatment plan, treatment alternatives and goals were discussed and mutually agreed upon: by patient  Worth 08/16/2016, 4:09 PM

## 2016-08-16 NOTE — Progress Notes (Signed)
Pt with decreased impulsiveness today. Slept between sessions and ambulated without contacting staff x 1.

## 2016-08-16 NOTE — Plan of Care (Signed)
Problem: RH Balance Goal: LTG Patient will maintain dynamic standing with ADLs (OT) LTG:  Patient will maintain dynamic standing balance with assist during activities of daily living (OT)   Downgraded secondary to cognition  Problem: RH Grooming Goal: LTG Patient will perform grooming w/assist,cues/equip (OT) LTG: Patient will perform grooming with assist, with/without cues using equipment (OT)  Downgraded secondary to cognition  Problem: RH Bathing Goal: LTG Patient will bathe with assist, cues/equipment (OT) LTG: Patient will bathe specified number of body parts with assist with/without cues using equipment (position)  (OT)  Downgraded secondary to cognition  Problem: RH Dressing Goal: LTG Patient will perform upper body dressing (OT) LTG Patient will perform upper body dressing with assist, with/without cues (OT).  Downgraded secondary to cognition Goal: LTG Patient will perform lower body dressing w/assist (OT) LTG: Patient will perform lower body dressing with assist, with/without cues in positioning using equipment (OT)  Downgraded secondary to cognition  Problem: RH Toileting Goal: LTG Patient will perform toileting w/assist, cues/equip (OT) LTG: Patient will perform toiletiing (clothes management/hygiene) with assist, with/without cues using equipment (OT)  Downgraded secondary to cognition  Problem: RH Simple Meal Prep Goal: LTG Patient will perform simple meal prep w/assist (OT) LTG: Patient will perform simple meal prep with assistance, with/without cues (OT).  Downgraded secondary to cognition  Problem: RH Toilet Transfers Goal: LTG Patient will perform toilet transfers w/assist (OT) LTG: Patient will perform toilet transfers with assist, with/without cues using equipment (OT)  Downgraded secondary to cognition  Problem: RH Tub/Shower Transfers Goal: LTG Patient will perform tub/shower transfers w/assist (OT) LTG: Patient will perform tub/shower transfers with  assist, with/without cues using equipment (OT)  Downgraded secondary to cognition

## 2016-08-16 NOTE — Plan of Care (Signed)
Problem: RH SAFETY Goal: RH STG ADHERE TO SAFETY PRECAUTIONS W/ASSISTANCE/DEVICE STG Adhere to Safety Precautions With Assistance/Device. Supervision   Outcome: Not Progressing Patient still impulsive

## 2016-08-16 NOTE — Progress Notes (Addendum)
[facility] PHYSICAL MEDICINE & REHABILITATION     PROGRESS NOTE  Subjective/Complaints:  Pt laying in bed this AM.  She states she slept well overnight.  Does not initiate conversation.   ROS: Denies CP, SOB, N/V/D.  Objective: Vital Signs: Blood pressure 107/81, pulse 63, temperature 98.2 F (36.8 C), temperature source Oral, resp. rate 16, height 5\' 2"  (1.575 m), SpO2 100 %. No results found. No results for input(s): WBC, HGB, HCT, PLT in the last 72 hours.  Recent Labs  08/15/16 0612  NA 137  K 4.0  CL 101  GLUCOSE 98  BUN 19  CREATININE 1.01*  CALCIUM 9.2   CBG (last 3)  No results for input(s): GLUCAP in the last 72 hours.  Wt Readings from Last 3 Encounters:  08/12/16 103.8 kg (228 lb 12.8 oz)    Physical Exam:  BP 107/81 (BP Location: Left Arm)   Pulse 63   Temp 98.2 F (36.8 C) (Oral)   Resp 16   Ht 5\' 2"  (1.575 m)   LMP  (LMP Unknown) Comment: neg preg test  SpO2 100%   BMI 41.85 kg/m  Constitutional: She appears well-developed. Obese. NAD HENT: Normocephalic and atraumatic.  Eyes: Conjunctivae and EOM are normal.  Cardiovascular: Normal rate and regular rhythm. No murmur heard. Respiratory: Effort normal and breath sounds normal. No respiratory distress. She has no wheezes.  GI: Soft. Bowel sounds are normal. She exhibits no distension. There is no tenderness.  Musculoskeletal: Normal range of motion. No tenderness.  Neurological: She is alert and oriented. No cranial nerve deficit.  Follows commands  Motor: 4-4+/5 throughout Problems with attention/concentration.  Skin: Skin is warm and dry.  Psychiatric: Her speech is normal. Her affect is blunt. She is slowed.    Assessment/Plan: 1. Functional deficits secondary to anoxic brain injury which require 3+ hours per day of interdisciplinary therapy in a comprehensive inpatient rehab setting. Physiatrist is providing close team supervision and 24 hour management of active medical problems listed  below. Physiatrist and rehab team continue to assess barriers to discharge/monitor patient progress toward functional and medical goals.  Function:  Bathing Bathing position   Position: Shower  Bathing parts Body parts bathed by patient: Right arm, Left arm, Chest, Abdomen, Front perineal area, Buttocks, Right upper leg, Left upper leg, Right lower leg, Left lower leg    Bathing assist Assist Level: Supervision or verbal cues      Upper Body Dressing/Undressing Upper body dressing   What is the patient wearing?: Pull over shirt/dress     Pull over shirt/dress - Perfomed by patient: Thread/unthread right sleeve, Thread/unthread left sleeve, Put head through opening, Pull shirt over trunk          Upper body assist Assist Level: Supervision or verbal cues      Lower Body Dressing/Undressing Lower body dressing   What is the patient wearing?: Non-skid slipper socks, Underwear, Pants Underwear - Performed by patient: Thread/unthread right underwear leg, Thread/unthread left underwear leg, Pull underwear up/down   Pants- Performed by patient: Thread/unthread right pants leg, Thread/unthread left pants leg, Pull pants up/down, Fasten/unfasten pants   Non-skid slipper socks- Performed by patient: Don/doff right sock, Don/doff left sock                    Lower body assist Assist for lower body dressing: Supervision or verbal cues      Toileting Toileting   Toileting steps completed by patient: Adjust clothing prior to toileting, Performs  perineal hygiene, Adjust clothing after toileting   Toileting Assistive Devices: Grab bar or rail  Toileting assist Assist level: Supervision or verbal cues   Transfers Chair/bed transfer   Chair/bed transfer method: Ambulatory Chair/bed transfer assist level: Supervision or verbal cues       Locomotion Ambulation     Max distance: 178ft  Assist level: Supervision or verbal cues   Wheelchair Wheelchair activity did not  occur: N/A        Cognition Comprehension Comprehension assist level: Understands basic 90% of the time/cues < 10% of the time  Expression Expression assist level: Expresses basic 90% of the time/requires cueing < 10% of the time.  Social Interaction Social Interaction assist level: Interacts appropriately 50 - 74% of the time - May be physically or verbally inappropriate.  Problem Solving Problem solving assist level: Solves basic 50 - 74% of the time/requires cueing 25 - 49% of the time  Memory Memory assist level: Recognizes or recalls 50 - 74% of the time/requires cueing 25 - 49% of the time     Medical Problem List and Plan: 1.  Cognitive, mobility and functional deficits secondary to anoxic brain injury             -Cont CIR  -Ritalin started 9/26. 2.  DVT Prophylaxis/Anticoagulation: Pharmaceutical: Lovenox 3. Pain Management: tylenol prn 4. Mood: LCSW to follow for evaluation and support.  5. Neuropsych: This patient is not capable of making decisions on her own behalf. 6. Skin/Wound Care: routine pressure relief measures. 7. Fluids/Electrolytes/Nutrition: Monitor I/O. Intake improving.   Encourage fluid 8. Hypokalemia: Resolved 10. Abnormal LFTs: Due to shocked liver: Improving.   HepA/Hep B negative. Ammonia levels WNL.   11. MRSA tracheitis: Last day clindamycin today.  12.  Polysubstance abuse: educate on cessation/ refer to counseling.  13. ABLA  Hb 10.9 on 9/23  Will cont to monitor  LOS (Days) 4 A FACE TO FACE EVALUATION WAS PERFORMED  Ankit Karis Juba 08/16/2016 8:46 AM

## 2016-08-17 ENCOUNTER — Inpatient Hospital Stay (HOSPITAL_COMMUNITY): Payer: Self-pay | Admitting: Physical Therapy

## 2016-08-17 ENCOUNTER — Inpatient Hospital Stay (HOSPITAL_COMMUNITY): Payer: Medicaid Other | Admitting: Speech Pathology

## 2016-08-17 ENCOUNTER — Inpatient Hospital Stay (HOSPITAL_COMMUNITY): Payer: Medicaid Other | Admitting: *Deleted

## 2016-08-17 ENCOUNTER — Inpatient Hospital Stay (HOSPITAL_COMMUNITY): Payer: Medicaid Other | Admitting: Occupational Therapy

## 2016-08-17 NOTE — Progress Notes (Signed)
Social Work Patient ID: Martha GravelJessica Z Godbee, female   DOB: 13-Jul-1981, 35 y.o.   MRN: [id number]  Have reviewed team conference with pt and spouse and they are pleased with d/c tomorrow.  Aware that she will be seen by our neuropsychologist prior to leaving to assist with determining follow up counseling.  They understand that she does not have any qualifying diagnoses under [employer] to receive any therapy follow up.  Physical therapist has provided her with information on pro bono clinic in [place].  Have explained that I will follow up with her as well to make any recommended referrals per Neuropsych consult.    Sundae Maners, LCSW

## 2016-08-17 NOTE — Progress Notes (Addendum)
Speech Language Pathology Daily Session Note  Patient Details  Name: Martha Lawson MRN: [id number] Date of Birth: 1981-01-27  Today's Date: 08/17/2016 SLP Individual Time: [id number] SLP Individual Time Calculation (min): 55 min   Short Term Goals: Week 1: SLP Short Term Goal 1 (Week 1): Patient will consume current diet with minimal overt s/s of aspiration with Mod I for use of swallowing compensatory strategies.  SLP Short Term Goal 2 (Week 1): Patient will utilize an increased vocal intensity at the phrase level with Min A verbal cues to achieve 75% intelligibility.  SLP Short Term Goal 3 (Week 1): Patient will initiate functional tasks with Min A verbal cues.  SLP Short Term Goal 4 (Week 1): Patient will demonstrate sustained attention to tasks for ~5 minutes with Mod A verbal cues for redirection.  SLP Short Term Goal 5 (Week 1): Patient will demonstrate functional problem solving for basic and familiar tasks with Mod A verbal cues.  SLP Short Term Goal 6 (Week 1): Patient will recall new, daily information with Mod A verbal cues for use of external aids.   Skilled Therapeutic Interventions:   Skilled treatment session focused on addressing dysphagia and cognitive-linguistic goals. SLP facilitated session by providing skilled observation of patient consuming regular textures and thin liquids with increased time for initiation of tray set-up, problem solving, and self-feeding.  Patient demonstrated occasional throat clears today; appears improved from yesterday with all vitals remaining WLF.    SLP also facilitated session with a structured description task to address initiation of verbal expression and speech intelligibility; patient required Mod question cues for descriptions with details and Min verbal and non-verbal cues to self-monitor and correct vocal intensity for overall ~75% intelligibility.  Patient demonstrated selective attention to task for ~30 minutes with no cues needed for  redirection.  Continue with current plan of care.   Function:  Eating Eating   Modified Consistency Diet: No Eating Assist Level: No help, No cues           Cognition Comprehension Comprehension assist level: Understands complex 90% of the time/cues 10% of the time  Expression   Expression assist level: Expresses basic needs/ideas: With extra time/assistive device  Social Interaction Social Interaction assist level: Interacts appropriately 75 - 89% of the time - Needs redirection for appropriate language or to initiate interaction.  Problem Solving Problem solving assist level: Solves basic 90% of the time/requires cueing < 10% of the time  Memory Memory assist level: Recognizes or recalls 75 - 89% of the time/requires cueing 10 - 24% of the time    Pain Pain Assessment Pain Assessment: No/denies pain  Therapy/Group: Individual Therapy  Martha Lawson, M.A., Martha Lawson [phone]  Martha Lawson 08/17/2016, 9:30 AM

## 2016-08-17 NOTE — Progress Notes (Signed)
[facility] PHYSICAL MEDICINE & REHABILITATION     PROGRESS NOTE  Subjective/Complaints:  Pt laying in bed this AM.  Significant other at bedside.  She keeps her eyes closed for the exam, but nods that everything is doing well.   ROS: Denies CP, SOB, N/V/D.  Objective: Vital Signs: Blood pressure (!) 112/57, pulse (!) 58, temperature 98 F (36.7 C), temperature source Oral, resp. rate 18, height 5\' 2"  (1.575 m), SpO2 100 %. No results found. No results for input(s): WBC, HGB, HCT, PLT in the last 72 hours.  Recent Labs  08/15/16 0612  NA 137  K 4.0  CL 101  GLUCOSE 98  BUN 19  CREATININE 1.01*  CALCIUM 9.2   CBG (last 3)  No results for input(s): GLUCAP in the last 72 hours.  Wt Readings from Last 3 Encounters:  08/12/16 103.8 kg (228 lb 12.8 oz)    Physical Exam:  BP (!) 112/57 (BP Location: Left Arm)   Pulse (!) 58   Temp 98 F (36.7 C) (Oral)   Resp 18   Ht 5\' 2"  (1.575 m)   LMP  (LMP Unknown) Comment: neg preg test  SpO2 100%   BMI 41.85 kg/m  Constitutional: She appears well-developed. Obese. NAD HENT: Normocephalic and atraumatic.  Eyes: Conjunctivae and EOM are normal.  Cardiovascular: Normal rate and regular rhythm. No murmur heard. Respiratory: Effort normal and breath sounds normal. No respiratory distress. She has no wheezes.  GI: Soft. Bowel sounds are normal. She exhibits no distension. There is no tenderness.  Musculoskeletal: Normal range of motion. No tenderness.  Neurological: She is alert and oriented (easily arousable). No cranial nerve deficit.  Follows commands  Motor: 4+/5 throughout Problems with attention/concentration.  Skin: Skin is warm and dry.  Psychiatric: Her speech is normal. Her affect is blunt. She is slowed.    Assessment/Plan: 1. Functional deficits secondary to anoxic brain injury which require 3+ hours per day of interdisciplinary therapy in a comprehensive inpatient rehab setting. Physiatrist is providing close team  supervision and 24 hour management of active medical problems listed below. Physiatrist and rehab team continue to assess barriers to discharge/monitor patient progress toward functional and medical goals.  Function:  Bathing Bathing position   Position: Shower  Bathing parts Body parts bathed by patient: Right arm, Left arm, Chest, Abdomen, Front perineal area, Buttocks, Right upper leg, Left upper leg, Right lower leg, Left lower leg    Bathing assist Assist Level: Supervision or verbal cues      Upper Body Dressing/Undressing Upper body dressing   What is the patient wearing?: Pull over shirt/dress     Pull over shirt/dress - Perfomed by patient: Thread/unthread right sleeve, Thread/unthread left sleeve, Put head through opening, Pull shirt over trunk          Upper body assist Assist Level: Supervision or verbal cues      Lower Body Dressing/Undressing Lower body dressing   What is the patient wearing?: Non-skid slipper socks, Underwear, Pants Underwear - Performed by patient: Thread/unthread right underwear leg, Thread/unthread left underwear leg, Pull underwear up/down   Pants- Performed by patient: Thread/unthread right pants leg, Thread/unthread left pants leg, Pull pants up/down, Fasten/unfasten pants   Non-skid slipper socks- Performed by patient: Don/doff right sock, Don/doff left sock                    Lower body assist Assist for lower body dressing: Supervision or verbal cues  Toileting Toileting   Toileting steps completed by patient: Adjust clothing prior to toileting, Performs perineal hygiene, Adjust clothing after toileting   Toileting Assistive Devices: Grab bar or rail  Toileting assist Assist level: Supervision or verbal cues   Transfers Chair/bed transfer   Chair/bed transfer method: Ambulatory Chair/bed transfer assist level: Supervision or verbal cues       Locomotion Ambulation     Max distance: 500 Assist level:  Supervision or verbal cues   Wheelchair Wheelchair activity did not occur: N/A        Cognition Comprehension Comprehension assist level: Follows basic conversation/direction with no assist  Expression Expression assist level: Expresses basic needs/ideas: With no assist  Social Interaction Social Interaction assist level: Interacts appropriately 90% of the time - Needs monitoring or encouragement for participation or interaction.  Problem Solving Problem solving assist level: Solves basic problems with no assist  Memory Memory assist level: Recognizes or recalls 75 - 89% of the time/requires cueing 10 - 24% of the time     Medical Problem List and Plan: 1.  Cognitive, mobility and functional deficits secondary to anoxic brain injury             -Cont CIR  -Ritalin started 9/26, will monitor for improvement. 2.  DVT Prophylaxis/Anticoagulation: Pharmaceutical: Lovenox 3. Pain Management: tylenol prn 4. Mood: LCSW to follow for evaluation and support.  5. Neuropsych: This patient is not capable of making decisions on her own behalf. 6. Skin/Wound Care: routine pressure relief measures. 7. Fluids/Electrolytes/Nutrition: Monitor I/O. Intake improving.   Encourage fluid 8. Hypokalemia: Resolved 10. Abnormal LFTs: Due to shocked liver: Improving.   HepA/Hep B negative. Ammonia levels WNL.   11. MRSA tracheitis: Completed course of clindamycin 9/26.  12.  Polysubstance abuse: educate on cessation/ refer to counseling.  13. ABLA  Hb 10.9 on 9/23  Will cont to monitor  LOS (Days) 5 A FACE TO FACE EVALUATION WAS PERFORMED  Martha Lawson Martha Lawson Martha Lawson 08/17/2016 7:58 AM

## 2016-08-17 NOTE — Plan of Care (Signed)
Problem: RH PAIN MANAGEMENT Goal: RH STG PAIN MANAGED AT OR BELOW PT'S PAIN GOAL <3  Outcome: Completed/Met Date Met: 08/17/16 No c/o pain

## 2016-08-17 NOTE — Patient Care Conference (Signed)
Inpatient RehabilitationTeam Conference and Plan of Care Update Date: 08/17/2016   Time: 1:50 PM    Patient Name: Martha Lawson      Medical Record Number: [id number]  Date of Birth: [birth year] Sex: Female         Room/Bed: 4M03C/4M03C-01 Payor Info: Payor: MEDICAID PENDING / Plan: MEDICAID PENDING / Product Type: *No Product type* /    Admitting Diagnosis: anoxic   Admit Date/Time:  08/12/2016 11:51 AM Admission Comments: No comment available   Primary Diagnosis:  Anoxic brain injury ([place]) Principal Problem: Anoxic brain injury [facility])  Patient Active Problem List   Diagnosis Date Noted  . Tracheitis   . Acute blood loss anemia   . Poor fluid intake   . Attention and concentration deficit   . Anoxic brain injury ([place]) 08/12/2016  . Polysubstance abuse 08/12/2016  . Metabolic acidosis   . Elevated LFTs   . Shock liver   . Acute respiratory failure ([place]) 08/05/2016  . Unresponsive     Expected Discharge Date:    Team Members Present:       Current Status/Progress Goal Weekly Team Focus  Medical   Cognitive, mobility and functional deficits secondary to anoxic brain injury  Improve safety, cognition  See above   Bowel/Bladder   Continent of bowel and bladder. LBM 08/12/16  Pt to remain continent of bowel and bladder  Monitor   Swallow/Nutrition/ Hydration   regular textures and thin liquids with intermittent supervision and intermittent throat clear   least restrictive diet with Mod I   education; goals met   ADL's   supervision overall - close supervision - steady assist for bathing and LB self care  supervision overall  pt/family edu, safety, self care, cognition, balance   Mobility   supervision overall  mod I bed mobility and basic transfers, supervision gait/stairs  At goal level for mobility; focus on safety and cognition with mobility   Communication   Min A for vocal intensity and Mod A for initiation  Min A  continue to address initiation, self-monitoring and  correcting    Safety/Cognition/ Behavioral Observations  Min-Mod A  Min A  recall and carryover, safety awareness, initiation, basic problem solving, and education     Pain   No c/o pain  <3  Monitor for nonverbal cues of pain   Skin   Intermittent bruising and tattoos, otherwise skin CDI  No skin breakdown  Assess skin q shift    Rehab Goals Patient on target to meet rehab goals: Yes *See Care Plan and progress notes for long and short-term goals.  Barriers to Discharge: ABLA, tracheitis, attention/concentration, safety, cognition    Possible Resolutions to Barriers:  Therapies, ritalin started    Discharge Planning/Teaching Needs:  Home with spouse who will provide 24/7 supervision along with pt's parents.  Teaching being completed today.   Team Discussion:  Pt reaching mod ind mobility goals but team recommends 24/7 supervision due to cognitive impairments.  SW reports pt does not have qualifying diagnosis for follow up therapies.  Pt to be seen by neuropsychology in the morning prior to d/c and hope to have recommendation for counseling f/u.  No DME needs.  Revisions to Treatment Plan:  None   Continued Need for Acute Rehabilitation Level of Care: The patient requires daily medical management by a physician with specialized training in physical medicine and rehabilitation for the following conditions: Daily direction of a multidisciplinary physical rehabilitation program to ensure safe treatment while eliciting the  highest outcome that is of practical value to the patient.: Yes Daily medical management of patient stability for increased activity during participation in an intensive rehabilitation regime.: Yes Daily analysis of laboratory values and/or radiology reports with any subsequent need for medication adjustment of medical intervention for : Neurological problems  Martha Lawson 08/17/2016, 2:40 PM

## 2016-08-17 NOTE — Progress Notes (Signed)
Physical Therapy Discharge Summary  Patient Details  Name: Martha Lawson MRN: [id number] Date of Birth: 07-02-1981  Today's Date: 08/17/2016 PT Individual Time: 1036-1100 and 1400-1445 PT Individual Time Calculation (min): 24 min and 45 min   Patient has met 11 of 11 long term goals due to improved activity tolerance, improved balance, improved postural control, increased strength, ability to compensate for deficits, improved attention and improved awareness.  Patient to discharge at an ambulatory level Supervision.   Patient's care partner is independent to provide the necessary cognitive assistance at discharge.  Reasons goals not met: All goals met  Recommendation:  Patient will benefit from ongoing skilled PT services in pro bono outpatient setting and provided pt with HEP to continue to advance safe functional mobility, address ongoing impairments in LE weakness, impaired timing/sequencing and motor planning, balance, cognition, and minimize fall risk.  Equipment: No equipment provided  Reasons for discharge: treatment goals met and discharge from hospital  Patient/family agrees with progress made and goals achieved: Yes  PT Discharge Pt received in bed with husband present but asleep.  Pt performed bed mobility independent and transferred out of bed Mod I.  Pt performed gait in controlled and home environment mod I with no cues for safety.  Performed re-assessment of LE strength and Berg balance assessment; see below for details.  Reviewed floor transfer sequence with pt return demonstrating mod I.  Pt returned to room and pt and husband checked off to perform ambulation and toileting together in room.  Will complete rest of graduation day this pm.  PM session: Pt alerted by social worker that per her diagnosis she will not qualify for f/u PT through [facility].  Provided pt with contact information for pro bono PT clinic in pt's community.   Pt ambulated to gym Mod I and participated in  continued gait training over compliant surface mod I, up/down curb Mod I, car transfer Mod I, up/down 24 stairs with L rail and supervision for alternating sequence, re-assessment of DGI and reviewed [place] [facility] for LE strength and dynamic balance.  No questions; pt returned to room and left in bed with bed alarm set.      Pain Pain Assessment Pain Assessment: No/denies pain  Cognition Orientation Level: Oriented X4 Sensation Sensation Light Touch: Appears Intact Stereognosis: Not tested Hot/Cold: Not tested Proprioception: Appears Intact Coordination Gross Motor Movements are Fluid and Coordinated: Yes Fine Motor Movements are Fluid and Coordinated: Yes Motor  Motor Motor: Motor impersistence  Mobility Bed Mobility Bed Mobility: Sit to Supine;Supine to Sit Rolling Right: 7: Independent Rolling Left: 7: Independent Supine to Sit: 7: Independent Sit to Supine: 7: Independent Transfers Sit to Stand: 6: Modified independent (Device/Increase time) Stand to Sit: 6: Modified independent (Device/Increase time) Stand Pivot Transfers: 6: Modified independent (Device/Increase time) Locomotion  Ambulation Ambulation/Gait Assistance: 6: Modified independent (Device/Increase time) Ambulation Distance (Feet): 150 Feet Assistive device: None Gait Gait Pattern: Step-through pattern;Decreased weight shift to right;Decreased weight shift to left Stairs / Additional Locomotion Stairs: Yes Stairs Assistance: 5: Supervision Stair Management Technique: One rail Left;Alternating pattern;Forwards Number of Stairs: 24 Height of Stairs: 7 Ramp: 6: Modified independent (Device) Wheelchair Mobility Wheelchair Mobility: No  Trunk/Postural Assessment  Cervical Assessment Cervical Assessment: Within Functional Limits Thoracic Assessment Thoracic Assessment: Within Functional Limits Lumbar Assessment Lumbar Assessment: Within Functional Limits Postural Control Postural Control: Deficits on  evaluation (delayed balance reactions)  Balance Standardized Balance Assessment Standardized Balance Assessment: Dynamic Gait Index Berg Balance Test Sit to Stand: Able  to stand without using hands and stabilize independently Standing Unsupported: Able to stand safely 2 minutes Sitting with Back Unsupported but Feet Supported on Floor or Stool: Able to sit safely and securely 2 minutes Stand to Sit: Sits safely with minimal use of hands Transfers: Able to transfer safely, minor use of hands Standing Unsupported with Eyes Closed: Able to stand 10 seconds safely Standing Ubsupported with Feet Together: Able to place feet together independently and stand 1 minute safely From Standing, Reach Forward with Outstretched Arm: Can reach confidently >25 cm (10") From Standing Position, Pick up Object from Floor: Able to pick up shoe safely and easily From Standing Position, Turn to Look Behind Over each Shoulder: Looks behind from both sides and weight shifts well Turn 360 Degrees: Able to turn 360 degrees safely one side only in 4 seconds or less Standing Unsupported, Alternately Place Feet on Step/Stool: Able to stand independently and safely and complete 8 steps in 20 seconds Standing Unsupported, One Foot in Front: Able to place foot tandem independently and hold 30 seconds Standing on One Leg: Able to lift leg independently and hold 5-10 seconds Total Score: 54 Dynamic Gait Index Level Surface: Normal Change in Gait Speed: Mild Impairment Gait with Horizontal Head Turns: Normal Gait with Vertical Head Turns: Normal Gait and Pivot Turn: Normal Step Over Obstacle: Mild Impairment Step Around Obstacles: Normal Steps: Mild Impairment Total Score: 21  Patient demonstrates increased fall risk as noted by score of 54/56 on Berg Balance Scale.  (<36= high risk for falls, close to 100%; 37-45 significant >80%; 46-51 moderate >50%; 52-55 lower >25%)  Extremity Assessment  RLE Assessment RLE  Assessment: Exceptions to [facility] (4/5 but delayed and motor impersistence) LLE Assessment LLE Assessment: Exceptions to [facility] (4/5; delayed and motor impersistence)   See Function Navigator for Current Functional Status.  Martha Lawson 08/17/2016, 12:33 PM

## 2016-08-17 NOTE — Progress Notes (Signed)
Occupational Therapy Session Note  Patient Details  Name: Martha GravelJessica Z Lawson MRN: [id number] Date of Birth: [birth year]  Today's Date: 08/17/2016 OT Individual Time: 1100-1200 OT Individual Time Calculation (min): 60 min     Short Term Goals: Week 1:  OT Short Term Goal 1 (Week 1): STG=LTG d/t short anticipated LOS.  Skilled Therapeutic Interventions/Progress Updates:    Pt seen this session to address cognitive skills. Pt was already dressed and ready for the day. Pt ambulated with therapist to several different areas of the unit with good balance but slow gait speed.  In kitchen, discussed lunchbox prep for her 2 children and what she needs to do to prepare the lunches. Practiced reading directions on cake mix boxes.  There were not peanut butter free recipe mixes available that she could have practiced making for her children.  Pt ambulated to day room to engage in a 40 min game of Lorayne MarekQwirkle which is a Psychologist, sport and exerciseconcentration/attention game of matching colors and shapes.  Pt needed cues to follow rules 75% of the time for most of the game and was then able to follow with only 25% cues. Good attention to game, pt reported no mental fatigue. Her processing speed is slow. Pt ambulated back to room with spouse in room with her.    Therapy Documentation Precautions:  Precautions Precautions: Fall Precaution Comments: seizure risk Restrictions Weight Bearing Restrictions: No  Pain: Pain Assessment Pain Assessment: No/denies pain ADL: ADL ADL Comments: See functional assessment tool  See Function Navigator for Current Functional Status.   Therapy/Group: Individual Therapy  Saad Buhl 08/17/2016, 12:51 PM

## 2016-08-18 ENCOUNTER — Inpatient Hospital Stay (HOSPITAL_COMMUNITY): Payer: Medicaid Other | Admitting: Occupational Therapy

## 2016-08-18 ENCOUNTER — Inpatient Hospital Stay (HOSPITAL_COMMUNITY): Payer: Self-pay | Admitting: Speech Pathology

## 2016-08-18 ENCOUNTER — Encounter (HOSPITAL_COMMUNITY): Payer: Self-pay

## 2016-08-18 MED ORDER — MULTI-VITAMIN/MINERALS PO TABS: 1.0000 | ORAL_TABLET | Freq: Every day | ORAL | Status: DC

## 2016-08-18 NOTE — Discharge Instructions (Signed)
Inpatient Rehab Discharge Instructions  Martha GravelJessica Z Lawson Discharge date and time: 08/18/16   Activities/Precautions/ Functional Status: Activity: no lifting, driving, or strenuous exercise till cleared by MD. Diet: regular diet Wound Care: none needed   Functional status:  ___ No restrictions     ___ Walk up steps independently _X__ 24/7 supervision/assistance   ___ Walk up steps with assistance ___ Intermittent supervision/assistance  ___ Bathe/dress independently ___ Walk with walker     _X__ Bathe/dress with supervision.  ___ Walk Independently    ___ Shower independently ___ Walk with assistance    ___ Shower with assistance _X__ No alcohol     ___ Return to work/school ________  Special Instructions: 1. Follow up therapy is not covered under current diagnosis--would be self pay.  2. Recommend following up at [facility] for follow up therapy.    My questions have been answered and I understand these instructions. I will adhere to these goals and the provided educational materials after my discharge from the hospital.  Patient/Caregiver Signature _______________________________ Date __________  Clinician Signature _______________________________________ Date __________  Please bring this form and your medication list with you to all your follow-up doctor's appointments.

## 2016-08-18 NOTE — Discharge Summary (Signed)
Physician Discharge Summary  Patient ID: Martha Lawson MRN: [id number] DOB/AGE: May 20, 1981 35 y.o.  Admit date: 08/12/2016 Discharge date: 08/18/2016  Discharge Diagnoses:  Principal Problem:   Anoxic brain injury [facility]) Active Problems:   Shock liver   Polysubstance abuse   Tracheitis   Acute blood loss anemia   Attention and concentration deficit   Discharged Condition: stable.   Significant Diagnostic Studies: Dg Chest Port 1 View  Result Date: 08/07/2016 CLINICAL DATA:  Respiratory failure. EXAM: PORTABLE CHEST 1 VIEW COMPARISON:  08/06/2016. FINDINGS: Cardiomegaly. Unchanged endotracheal tube, and central venous catheter. Low lung volumes with mild vascular congestion. No infiltrates or failure. IMPRESSION: Stable cardiomegaly, support apparatus and mild vascular congestion. Electronically Signed   By: Staci Righter M.D.   On: 08/07/2016 08:51   Dg Chest Port 1 View  Result Date: 08/06/2016 CLINICAL DATA:  Intubated patient EXAM: PORTABLE CHEST 1 VIEW COMPARISON:  08/05/2016 FINDINGS: Endotracheal tube, NG tube and central venous line unchanged. Mild central venous congestion. No pneumothorax or infiltrate. IMPRESSION: Stable support apparatus. Central venous congestion, mild. Electronically Signed   By: Suzy Bouchard M.D.   On: 08/06/2016 07:09     Labs:  Basic Metabolic Panel:  Lab 71/59/53 0512 08/13/16 0457 08/15/16 0612  NA 137 139 137  K 3.4* 3.6 4.0  CL 103 100* 101  CO2 '29 28 29  ' GLUCOSE 90 83 98  BUN '11 8 19  ' CREATININE 0.93 0.96 1.01*  CALCIUM 8.3* 9.1 9.2  MG 1.7  --   --   PHOS 4.1  --   --     CBC: CBC Latest Ref Rng & Units 08/13/2016 08/11/2016 08/08/2016  WBC 4.0 - 10.5 K/uL 8.8 8.7 9.6  Hemoglobin 12.0 - 15.0 g/dL 10.9(L) 10.4(L) 11.5(L)  Hematocrit 36.0 - 46.0 % 32.9(L) 29.6(L) 33.2(L)  Platelets 150 - 400 K/uL 202 128(L) 96(L)    CBG:  Recent Labs Lab 08/12/16 0001 08/12/16 0348 08/12/16 0754  GLUCAP 70 92 91    Liver Function  Tests:   Hepatic Function Latest Ref Rng & Units 08/15/2016 08/13/2016 08/11/2016  Total Protein 6.5 - 8.1 g/dL 6.9 6.3(L) 5.7(L)  Albumin 3.5 - 5.0 g/dL 3.5 2.9(L) 2.7(L)  AST 15 - 41 U/L 61(H) 50(H) 73(H)  ALT 14 - 54 U/L 360(H) 514(H) 909(H)  Alk Phosphatase 38 - 126 U/L 34(L) 36(L) 37(L)  Total Bilirubin 0.3 - 1.2 mg/dL 0.7 1.1 1.2  Bilirubin, Direct 0.1 - 0.5 mg/dL - - -    Brief HPI:   Martha Tow Smithis an 34 y.o.femalewith history of polysubstance abuse who was using drugs and was found unresponsive at home on 08/05/16. UDS positive for cocaine, benzos, opiates and amphetamines. GCS 3 and she was intubated and placed on pressors for neurogenic shock due to cocaine toxicity with severe metabolic acidosis.   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 Boderline elevated troponin likely due to demand ischemia and patient self extubated on 9/18. She was noted to have foul smelling secretions due to MRSA tracheitis and was treated with Vancomycin and transitioned to clindamycin at discharge.  patient with resultant bouts of lethargy, cognitive deficits and balance deficits. Therapy ongoing and CIR was recommended for follow up therapy.    Hospital Course: Martha Lawson was admitted to rehab 08/12/2016 for inpatient therapies to consist of PT, ST and OT at least three hours five days a  week. Past admission physiatrist, therapy team and rehab RN have worked together to provide customized collaborative inpatient rehab. Respiratory status has been stable and she completed her antibiotic course on 09/26 without side effects. Blood pressures have been stable and she has been afebrile during her stay. Follow up labs revealed mild hypokalemia which has resolved with supplementation. Abnormal LFTs due to shocked liver is resolving and no GI symptoms reported. Po intake is good and she is  continent of bowel and bladder.   Her lethargy has resolved but she continues to have poor memory with delayed processing. Patient and husband have been educated on HEP as therapy not covered by Medicaid. She has been given information on following up with [facility] student therapy clinic as well as open door clinic. Patient and husband have been instructed that they will need to go by the clinic during their open hours to set up as a patient.  24 hours supervision is recommended due to cognitive deficits and safety concerns. She was discharged to home on 08/18/16 in improved condition.    Rehab course: During patient's stay in rehab weekly team conferences were held to monitor patient's progress, set goals and discuss barriers to discharge. She required min assist with basic self care tasks and supervision with mobility. She exhibited moderate to severe cognitive impairments affecting memory, attention, awareness, initiation and recall. She has had improvement in activity tolerance, balance, postural control, as well as ability to compensate for deficits. She is able to complete ADL tasks at supervision level. She is able to ambulate at modified independent level on level surfaces and to navigate 24 stairs. She continues to have delayed processing and needs min cues to complete functional and familiar tasks. She continues to require mod to max cues for emergent awareness. Family education was completed with mother and husband who will provide supervision after discharge.    Disposition: 01-Home or Self Care   Diet: Regular  Special Instructions: 1.Needs 24 hours supervision.  2. No lifting, driving or strenuous activity till cleared by MD.   Discharge Instructions    Ambulatory referral to Physical Medicine Rehab    Complete by:  As directed    1-2 week follow up for ABI       Medication List    TAKE these medications   multivitamin with minerals tablet Take 1 tablet by mouth daily.       Follow-up Information    Ankit Lorie Phenix, MD .   Specialty:  Physical Medicine and Rehabilitation Why:  office will call you with follow up appointment Contact information: [address] [place] [address] [phone]        [place] .   Specialty:  Primary Care Why:  You need to go to the office today or tuesday to be set up as a patient. See handout for times ( today open 4:15 - 8 pm)   Contact information: [place] [phone]          Signed: Bary Leriche 08/18/2016, 9:39 PM

## 2016-08-18 NOTE — Progress Notes (Signed)
Discharged to home accompanied by spouse. Discharge info given to pt and family by Marissa NestlePam Love PAC. No other questions noted. Pamelia HoitSharp, Luverne Zerkle B

## 2016-08-18 NOTE — Progress Notes (Signed)
Speech Language Pathology Discharge Summary  Patient Details  Name: Martha Lawson MRN: [id number] Date of Birth: Apr 01, 1981  Patient has met 5 of 6 long term goals.  Patient to discharge at [facility] level.   Reasons goals not met: Patient continues to require Mod-Max A cues for emergent awareness   Clinical Impression/Discharge Summary:  Patient has made functional gains and has met 5 of 6 LTG's this admission due to improved swallowing, cognitive and speech function. Currently, patient is consuming regular textures with thin liquids without overt s/s of aspiration and is Mod I for use of swallowing compensatory strategies. Patient requires Min A verbal cues for use of speech intelligibility strategies to maximize intelligibility to ~75-90% accuracy. Patient also requires overall Min A multimodal cues to complete functional and familiar tasks safely in regards to attention, recall and problem solving. Education has been completed with the patient and her mother and patient will discharge home with 24 hour supervision from family. Patient would benefit from f/u SLP services to maximize cognitive function and overall functional independence.   Care Partner:  Caregiver Able to Provide Assistance: Yes  Type of Caregiver Assistance: Cognitive  Recommendation:  24 hour supervision/assistance;Outpatient SLP  Rationale for SLP Follow Up: Maximize cognitive function and independence   Equipment: N/A   Reasons for discharge: Discharged from hospital   Patient/Family Agrees with Progress Made and Goals Achieved: Yes       [place], [place] 08/18/2016, 12:02 PM

## 2016-08-18 NOTE — Consult Note (Signed)
NEUROCOGNITIVE TESTING - CONFIDENTIAL [facility]   [name] is a 35 year old woman, who was seen for a brief neuropsychological assessment to evaluate cognitive and emotional functioning in the setting of anoxic brain injury secondary to polysubstance drug overdose.  During the clinical interview, [name] acknowledged the presence of anxiety and admitted to coping with anxious mood by trying drug use, though she stated that the incident that lead her to this hospitalization was her first time abusing substances.  She said that she has also felt depressed in the past, but has never been formally treated with medication or psychotherapy; she denied current or past suicidal ideation.  She commented that she would be open to participating in individual psychotherapy and in meeting with a psychiatrist, but was not interested in group therapy because she tends to prefer being solitary.  [name] commented that she had never tried abusing substances before (though they had been offered to her by friends), because she was afraid of possible health risks.  Now that she has had this experience, she said that she does not have interest in using again or in being around those friends.  Since being in the hospital, she stated that she has been attempting to cope with anxious mood by taking deep breaths, though that helps only on occasion.    PROCEDURES: [3 units of [id number] on 08/18/2016]  The following tests were performed during today's visit: Repeatable Battery for the Assessment of Neuropsychological Status (RBANS, form B), Geriatric Anxiety Inventory, and the Beck Depression Inventory-Fast Screen for Medical Patients.  Test results are as follows:   RBANS Indices Scaled Score Percentile Description  Immediate Memory  73 4 Impaired  Visuospatial/Constructional 81 10 Below Average  Language 78 7 Impaired  Attention 56 < 1 Profoundly Impaired  Delayed Memory 83 13 Below  Average  Total Score 67 1 Profoundly Impaired   RBANS Subtests Raw Score Percentile Description  List Learning 23 4 Impaired  Story Memory 13 3 Impaired  Figure Copy 19 45 Average  Line Orientation 10 1 Profoundly Impaired  Picture Naming 10 70 Average  Semantic Fluency 14 2 Profoundly Impaired  Digit Span 9 14 Below Average  Coding 25 < 1 Profoundly Impaired  List Recall 5 8 Impaired  List Recognition 20 61 Average  Story Recall 4 < 1 Profoundly Impaired  Figure recall 12 7 Impaired   Beck Depression Inventory-Fast Raw Score = 0 Description = WNL   Geriatric Anxiety Inventory Raw Score = 16/20 Description = Severe   Test results revealed impairments that were predominant in processing speed and memory.  She also demonstrated some difficulty making fine visual distinctions, but notably, was usually only slightly off in her determination of line positions.  Regarding memory, she had most trouble with encoding, as she was able to recall most of what she initially encoded following delays and could correctly recognize all studied information with cues.  Her cognitive difficulties are at the level of a Major Neurocognitive Disorder and are likely directly related to the anoxic injury.  Therefore, she will likely experience some level of cognitive improvement over time, though it remains unclear at this point whether or not she will return to her cognitive baseline.  Time was spent during today's session providing psychoeducation regarding expectations for cognitive recovery in cases of anoxic brain injury.  She was encouraged to seek a comprehensive neuropsychological evaluation in 6-12 months if her cognitive symptoms do not resolve completely.  Contact information for  a neuropsychologist in the community was given to her for this purpose.  From an emotional standpoint, she endorsed symptoms consistent with moderate to severe anxiety, but she did not endorse symptoms consistent with clinical  depression.    In light of these findings, the following recommendations are provided.    RECOMMENDATIONS:  . Complete a comprehensive neuropsychological evaluation as an outpatient in 6-12 months if cognitive functioning does not return to baseline. . Participate in individual psychotherapy to learn more effective coping strategies to deal with anxiety and to address substance use issues.   . Consultation with a psychiatrist is recommended given the presence of anxious mood and history of depression.   . Maintain engagement in mentally, physically and cognitively stimulating activities.  . Allow extra time when completing activities. . Write down important information to assist memory.  . Use alarm reminders to assist with memory.  . Strive to maintain a healthy lifestyle (e.g., proper diet and exercise) in order to promote physical, cognitive and emotional health.  . Refrain from use of alcohol and illicit substances.    Leavy Cella, Psy.D.  Clinical Neuropsychologist

## 2016-08-18 NOTE — Progress Notes (Signed)
Occupational Therapy Discharge Summary  Patient Details  Name: Martha Lawson MRN: [id number] Date of Birth: [birth year]  Today's Date: 08/18/2016 OT Individual Time: 0830-0900 OT Individual Time Calculation (min): 30 min     Patient has met 13 of 13 long term goals due to improved activity tolerance, improved balance and ability to compensate for deficits.  Patient to discharge at overall Supervision level.  Patient's care partner is independent to provide the necessary physical and cognitive assistance at discharge.    Reasons goals not met: n/a  Recommendation:  Patient will benefit from ongoing skilled OT services in outpatient setting to continue to advance functional skills in the area of iADL. At this time, pt does not qualify for services.  Recommended to pt to be as active as possible at home, make lists, keep a schedule, etc.  Equipment: No equipment provided  Reasons for discharge: treatment goals met  Patient/family agrees with progress made and goals achieved: Yes  OT Discharge  ADL ADL ADL Comments: S with shower transfers, mobility in home; mod I with self care Vision/Perception  Vision- History Baseline Vision/History: No visual deficits Patient Visual Report: No change from baseline Vision- Assessment Vision Assessment?: No apparent visual deficits  Cognition Overall Cognitive Status: Impaired/Different from baseline Attention: Focused;Sustained Focused Attention: Appears intact Sustained Attention: Impaired Sustained Attention Impairment: Verbal basic;Functional basic Memory: Impaired Memory Impairment: Decreased recall of new information;Decreased short term memory;Storage deficit Decreased Short Term Memory: Verbal basic;Functional basic Awareness: Impaired Awareness Impairment: Intellectual impairment Problem Solving: Impaired Problem Solving Impairment: Verbal basic;Functional basic Executive Function: Writer: Impaired Organizing  Impairment: Verbal complex;Functional complex Safety/Judgment: Impaired Sensation Sensation Light Touch: Appears Intact Stereognosis: Appears Intact Hot/Cold: Appears Intact Proprioception: Appears Intact Coordination Gross Motor Movements are Fluid and Coordinated: Yes Fine Motor Movements are Fluid and Coordinated: Yes Motor  Motor Motor: Motor impersistence Mobility    supervision with transfers, mobility in the home/community Trunk/Postural Assessment  Cervical Assessment Cervical Assessment: Within Functional Limits Thoracic Assessment Thoracic Assessment: Within Functional Limits Lumbar Assessment Lumbar Assessment: Within Functional Limits Postural Control Postural Control:  (delayed balance reactions)  Balance Static Sitting Balance Static Sitting - Level of Assistance: 7: Independent Dynamic Sitting Balance Dynamic Sitting - Level of Assistance: 7: Independent Static Standing Balance Static Standing - Level of Assistance: 7: Independent Dynamic Standing Balance Dynamic Standing - Level of Assistance: 6: Modified independent (Device/Increase time) Extremity/Trunk Assessment RUE Assessment RUE Assessment: Within Functional Limits LUE Assessment LUE Assessment: Within Functional Limits   See Function Navigator for Current Functional Status.  [place] 08/18/2016, 10:26 AM

## 2016-08-22 NOTE — Progress Notes (Signed)
Patient arrived at [id number] set up and instruction of [facility]  Appropriate forms were completed, vitals taken, and medication reconciled. Patient was instructed on proper set-up and operation of HST unit. Written instructions with set up diagram given for reference and reinforcement of verbal instruction and demonstration. Patient able to return demonstration appropriately. No home environment, vision, dexterity, comprehension concerns with this patient based on completed forms and patient interactions. Patient will return unit after 2 nights on (08/24/16) before noon as instructed.

## 2016-08-24 NOTE — Progress Notes (Signed)
Social Work  Discharge Note  The overall goal for the admission was met for:   Discharge location: Yes - home with spouse who, along with pt's family, will provide 24/7 supervision  Length of Stay: Yes - 6 days  Discharge activity level: Yes - supervision  Home/community participation: Yes  Services provided included: MD, RD, PT, OT, SLP, RN, TR, Pharmacy and Neuropsych  Financial Services: Other: None - Medicaid application started while here  Follow-up services arranged: No follow up services arranged as she did not have a qualifying diagnosis for services under Medicaid.  Pt and spouse were aware and provided with local pro-bono resources they could access.  Comments (or additional information):  Have provided pt and spouse with information on local mental health resources per recommendation from Dr. Beverly Gust.  Patient/Family verbalized understanding of follow-up arrangements: Yes  Individual responsible for coordination of the follow-up plan: pt/spouse  Confirmed correct DME delivered: NA Heiress [facility]

## 2016-08-29 ENCOUNTER — Encounter: Payer: Medicaid Other | Attending: Physical Medicine & Rehabilitation

## 2016-08-29 ENCOUNTER — Ambulatory Visit (HOSPITAL_BASED_OUTPATIENT_CLINIC_OR_DEPARTMENT_OTHER): Payer: Self-pay | Admitting: Physical Medicine & Rehabilitation

## 2016-08-29 ENCOUNTER — Encounter: Payer: Self-pay | Admitting: Physical Medicine & Rehabilitation

## 2016-08-29 VITALS — BP 115/85 | HR 90 | Resp 14

## 2016-08-29 DIAGNOSIS — R41840 Attention and concentration deficit: Secondary | ICD-10-CM

## 2016-08-29 DIAGNOSIS — G931 Anoxic brain damage, not elsewhere classified: Secondary | ICD-10-CM | POA: Diagnosis not present

## 2016-08-29 DIAGNOSIS — F1910 Other psychoactive substance abuse, uncomplicated: Secondary | ICD-10-CM | POA: Insufficient documentation

## 2016-08-29 NOTE — Patient Instructions (Signed)
Your maximum recovery should be in about 11 months  Ref[facility] to [facility], they may wait for Medicaid to become active

## 2016-08-29 NOTE — Progress Notes (Signed)
Subjective:    Patient ID: Martha Lawson, female    DOB: 10-17-1981, 35 y.o.   MRN: [id number] 35 y.o. female with history of polysubstance abuse who was using drugs and was found unresponsive at home on 08/05/16. UDS positive for cocaine, benzos, opiates and amphetamines. GCS 3 and she was intubated and placed on pressors for neurogenic shock due to cocaine toxicity with severe metabolic acidosis.   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 Boderline elevated troponin likely due to demand ischemia and patient self extubated on 9/18. She was noted to have foul smelling secretions due to MRSA tracheitis and was treated with Vancomycin and transitioned to clindamycin at discharge.  patient with resultant bouts of lethargy, cognitive deficits and balance deficits Admit date: 08/12/2016 Discharge date: 08/18/2016  HPI   Here for hospital follow-up. Patient has not had any further drug or alcohol use. Living with her mother. She is independent with her self-care and mobility. Mother notes that the patient is very slow to respond and moves slowly. She has not received any outpatient therapy. They're currently awaiting Medicaid She uses no assisted devices Pain Inventory Average Pain 0 Pain Right Now 0 My pain is No pain  In the last 24 hours, has pain interfered with the following? General activity 0 Relation with others 0 Enjoyment of life 0 What TIME of day is your pain at its worst? No pain Sleep (in general) Good  Pain is worse with: No pain Pain improves with: No pain Relief from Meds: No pain  Mobility ability to climb steps?  yes  Function Do you have any goals in this area?  no  Neuro/Psych No problems in this area  Prior Studies Any changes since last visit?  no IMPRESSION: Marked restricted diffusion of both globus pallidi consistent with nonhemorrhagic  infarction due to anoxic injury. See discussion above. Smaller focus of acute infarction affects the LEFT corpus callosum.     Electronically Signed   By: Elsie Stain M.D.   On: 08/05/2016 16:42  Physicians involved in your care Any changes since last visit?  no   Family History  Problem Relation Age of Onset  . High blood pressure Mother   . Diabetes Mother   . Stroke Mother   . High blood pressure Father   . AAA (abdominal aortic aneurysm) Father    Social History   Social History  . Marital status: Single    Spouse name: N/A  . Number of children: N/A  . Years of education: N/A   Social History Main Topics  . Smoking status: Former Games developer  . Smokeless tobacco: Former Neurosurgeon  . Alcohol use No  . Drug use:   . Sexual activity: Yes   Other Topics Concern  . None   Social History Narrative  . None   No past surgical history on file. Past Medical History:  Diagnosis Date  . Asthma    in fall due to allergies   BP 115/85   Pulse 90   Resp 14   LMP  (LMP Unknown) Comment: neg preg test  SpO2 98%   Opioid Risk Score:   Fall Risk Score:  `1  Depression screen PHQ 2/9  Depression screen PHQ 2/9 08/29/2016  Decreased Interest 0  Down, Depressed, Hopeless 0  PHQ - 2 Score 0  Altered sleeping 0  Tired, decreased energy 1  Change  in appetite 0  Feeling bad or failure about yourself  0  Trouble concentrating 1  Moving slowly or fidgety/restless 0  Suicidal thoughts 0  PHQ-9 Score 2     Review of Systems  All other systems reviewed and are negative.      Objective:   Physical Exam  Constitutional: She is oriented to person, place, and time. She appears well-developed and well-nourished.  HENT:  Head: Normocephalic and atraumatic.  Eyes: Conjunctivae and EOM are normal. Pupils are equal, round, and reactive to light.  Neck: Normal range of motion.  Cardiovascular: Normal rate, regular rhythm, normal heart sounds and intact distal pulses.     Pulmonary/Chest: Effort normal and breath sounds normal.  Abdominal: Soft. Bowel sounds are normal.  Neurological: She is alert and oriented to person, place, and time.  Psychiatric: Thought content normal. Her affect is blunt. Her speech is delayed. Her speech is not slurred. She is slowed.  Patient did not speak unless spoken to.  Nursing note and vitals reviewed.  Patient remembers 3 out of 3 objects after a 2 minute delay. She has difficulty with problem solving such as naming 3 coins that add up to $0.40  Unable to do serial sevens Able to spell world forward and backwards Oriented to person, place and time  No Evidence of tremor Motor strength is 5/5 bilateral deltoids, biceps, triceps, grip, hip flexor, knee extensor, ankle dorsi flexion, plantar flexion. Sensation intact light touch bilateral upper and lower limbs.     Assessment & Plan:   1. Anoxic encephalopathy. We've reviewed her MRI scan and looked at the films. She has diffusion restrictions in the globus pallidus bilaterally, she also has more global findings with cognitive dysfunction affecting primarily attention and concentration. Will write for speech therapy to start. This may be limited by her insurance status and awaiting Medicaid. This will be at [facility]. Physical medicine and rehab followup Dr. Allena KatzPatel. Patient unable to work at this time, unable to drive

## 2016-09-02 ENCOUNTER — Encounter

## 2016-09-02 ENCOUNTER — Inpatient Hospital Stay: Admit: 2016-09-02 | Attending: Family | Primary: Registered Nurse

## 2016-09-02 DIAGNOSIS — R1011 Right upper quadrant pain: Secondary | ICD-10-CM

## 2016-09-08 ENCOUNTER — Ambulatory Visit: Payer: Medicaid Other | Attending: Physical Medicine & Rehabilitation | Admitting: Speech Pathology

## 2016-09-14 ENCOUNTER — Encounter: Attending: Pulmonary Disease | Primary: Registered Nurse

## 2016-09-14 ENCOUNTER — Ambulatory Visit
Admit: 2016-09-14 | Discharge: 2016-09-14 | Payer: PRIVATE HEALTH INSURANCE | Attending: Surgery | Primary: Registered Nurse

## 2016-09-14 DIAGNOSIS — F419 Anxiety disorder, unspecified: Secondary | ICD-10-CM

## 2016-09-14 NOTE — Progress Notes (Signed)
BARIATRIC SURGERY OFFICE NOTE    SUBJECTIVE:    Patient presenting today referred from Gala MurdochSue A Carter, for   Chief Complaint   Patient presents with   ??? Weight Management     5th WM visit, diet, exercise and pre-surgical weight loss.   .    Vitals:    09/14/16 1450   BP: 126/82   Pulse: 84   Resp: 16        HPI: Charmaine DownsJessica S Fair is a 35 y.o. female presenting in fifth bariatric visit, follow up diet and exercise - pre-operative weight loss, in consideration for bariatric surgery.    BMI: Body mass index is 46.82 kg/m??. Obesity Classification: III Morbid Obesity.    Weight History:   Wt Readings from Last 3 Encounters:   09/14/16 256 lb (116.1 kg)   08/12/16 259 lb 1.6 oz (117.5 kg)   07/13/16 268 lb (121.6 kg)     Total weight loss/gain -2.7 Lbs over 1 month.      Patient dines out to a sit down restaurant 1 times per week.    Patient eats fast food meals 2 times per month.      Drinks mostly water    24 hour recall/food frequency chart:  Breakfast: premier protein shake  Snack: none  Lunch: salad with tuna  Snack: pretzels  Dinner: salad and veg soup  Snack: none    Total daily calories: 1200      Exercises gym ([employer]) 60 min 3  Times per week.    Cardiac and pulm clearances ok ON CPAP now - perseverant beautifully -    EGD GERD, morbid obesity: Body mass index is 46.82 kg/m??., in preparation for a bariatric procedure; will proceed with EGD to r/o GERD, Hiatal Hernia, Peptic Ulcer Disease, Gastroparesis, Esophageal dysmotility; etc.      Thoroughly reviewed the patient's medical history, family history, social history and review of systems with the patient today in the office.  Please see medical record for pertinent positives.      Past Medical History:   Diagnosis Date   ??? Anxiety    ??? Depression    ??? Fatigue    ??? H/O hysterectomy for benign disease 2012    endometriosis   ??? Obesity       Past Surgical History:   Procedure Laterality Date   ??? ADENOIDECTOMY     ??? HYSTERECTOMY     ??? TONSILLECTOMY        Current Outpatient Prescriptions   Medication Sig Dispense Refill   ??? Multiple Vitamins-Minerals (THERAPEUTIC MULTIVITAMIN-MINERALS) tablet Take 1 tablet by mouth daily     ??? buPROPion (WELLBUTRIN XL) 300 MG extended release tablet Take 1 tablet by mouth every morning 30 tablet 3   ??? Cholecalciferol (VITAMIN D) 2000 UNITS TABS tablet Take 1 tablet by mouth daily 30 tablet 11   ??? loratadine (CLARITIN) 10 MG capsule Take 10 mg by mouth daily       No current facility-administered medications for this visit.       Allergies   Allergen Reactions   ??? Ciprofloxacin    ??? Pcn [Penicillins] Hives   ??? Sulfa Antibiotics Hives   ??? Morphine And Related Nausea And Vomiting           Review of Systems   Constitutional: Positive for fatigue. Negative for activity change, chills, diaphoresis and fever.   HENT: Negative for sore throat, trouble swallowing and voice change.    Eyes: Negative  for photophobia and visual disturbance.   Respiratory: Positive for shortness of breath. Negative for cough and wheezing.    Cardiovascular: Positive for leg swelling. Negative for chest pain and palpitations.   Gastrointestinal: Positive for abdominal distention and abdominal pain. Negative for anal bleeding, blood in stool, constipation, diarrhea, nausea and vomiting.   Endocrine: Positive for polyphagia. Negative for cold intolerance, heat intolerance, polydipsia and polyuria.   Genitourinary: Positive for urgency. Negative for dysuria, frequency and hematuria.   Musculoskeletal: Positive for arthralgias, back pain and gait problem. Negative for joint swelling, myalgias and neck stiffness.   Skin: Negative for color change and rash.   Neurological: Negative for seizures, speech difficulty, light-headedness and numbness.   Hematological: Negative for adenopathy. Does not bruise/bleed easily.   Psychiatric/Behavioral: Positive for sleep disturbance. The patient is nervous/anxious.          OBJECTIVE:    BP 126/82 (Site: Left Arm, Position:  Sitting, Cuff Size: Large Adult)    Pulse 84    Resp 16    Ht 5\' 2"  (1.575 m)    Wt 256 lb (116.1 kg)    BMI 46.82 kg/m??      Physical Exam   Constitutional: She is oriented to person, place, and time. She appears well-developed and well-nourished. No distress.   HENT:   Head: Normocephalic and atraumatic.   Eyes: Conjunctivae and EOM are normal. Pupils are equal, round, and reactive to light. No scleral icterus.   Neck: Normal range of motion. No JVD present. No tracheal deviation present. No thyromegaly present.   Cardiovascular: Normal rate, regular rhythm, normal heart sounds and intact distal pulses.  Exam reveals no gallop and no friction rub.    No murmur heard.  Pulmonary/Chest: Effort normal. No stridor. No respiratory distress. She has no wheezes. She has no rales. She exhibits no tenderness.   Abdominal: Soft. Bowel sounds are normal. She exhibits no distension and no mass. There is no tenderness. There is no rebound and no guarding.   Musculoskeletal: Normal range of motion. She exhibits no edema or tenderness.   Lymphadenopathy:     She has no cervical adenopathy.   Neurological: She is alert and oriented to person, place, and time. No cranial nerve deficit. Coordination normal.   Skin: Skin is warm and dry. No rash noted. She is not diaphoretic. No erythema. No pallor.   Psychiatric: Her behavior is normal. Judgment and thought content normal.   Vitals reviewed.        ASSESSMENT & PLAN:    1. Anxiety    2. Arthritis    3. Dyslipidemia    4. Gastroesophageal reflux disease without esophagitis    5. Morbid obesity with BMI of 50.0-59.9, adult (HCC)    6. Primary stabbing headache    7. Reactive depression    8. Snores    9. SUI (stress urinary incontinence, female)       Patient was encouraged to journal all food intake. Keep calorie level at approximately 1200-1500. Protein intake is to be a minimum of 60-80 grams per day. Water drinking was encouraged with a goal of 64oz-128oz daily. Beverages to be  calorie free except for milk. Every other beverage should be water. They are to avoid soda.   Continue to increase level of physical activity.      More than 50% of the office visit today was spent in face to face counseling regarding diet and exercise, in preparation for her planned Robotic Sleeve Gastrectomy.  Counting calories, complying with the dietitian's recommendations, and complying with the preoperative workup including the dietitian counseling, exercise physiologist counseling, cardiologist evaluation and pre-operative optimization, pulmonologist evaluation and pre operative optimization, pre operative EGD evaluation.  The patient expressed understanding and willingness to comply nicely; all questions and concerns addressed in details.    Patient counseled on the risks, benefits, and alternatives of treatment plan at length while in the office today. Patient states an understanding and willingness to proceed with the plan.      Follow Up:  Return in about 4 weeks (around 10/12/2016) for For imaging and tests results review.      Estill Bakes, MD, FACS, FICS  Member of the [employer]    [phone]    09/14/16

## 2016-10-05 NOTE — Telephone Encounter (Signed)
Spoke with patient, advised his/her EGD is scheduled for 12/02/16 @1145am  (arrival 945 ) at [facility], instructed NPO after midnight. Patient voiced understanding.

## 2016-10-12 ENCOUNTER — Ambulatory Visit: Payer: Self-pay | Admitting: Physical Medicine & Rehabilitation

## 2016-10-19 ENCOUNTER — Encounter: Payer: Self-pay | Admitting: Physical Medicine & Rehabilitation

## 2016-10-19 ENCOUNTER — Encounter: Payer: Medicaid Other | Attending: Physical Medicine & Rehabilitation | Admitting: Physical Medicine & Rehabilitation

## 2016-10-19 ENCOUNTER — Encounter: Attending: Pulmonary Disease | Primary: Registered Nurse

## 2016-10-19 VITALS — BP 130/88 | HR 88 | Resp 14

## 2016-10-19 DIAGNOSIS — G931 Anoxic brain damage, not elsewhere classified: Secondary | ICD-10-CM | POA: Diagnosis present

## 2016-10-19 DIAGNOSIS — R41840 Attention and concentration deficit: Secondary | ICD-10-CM | POA: Insufficient documentation

## 2016-10-19 DIAGNOSIS — F1910 Other psychoactive substance abuse, uncomplicated: Secondary | ICD-10-CM | POA: Insufficient documentation

## 2016-10-19 NOTE — Progress Notes (Signed)
Subjective:    Patient ID: Martha Lawson, female    DOB: 04-30-1981, 35 y.o.   MRN: [id number] HPI  35 y.o.femalewith history of polysubstance presents for follow up of anoxic brain injury.   Last clinic visit 08/29/16.  Since that time, she has been approved for Medicaid.  She was prescribed SLP, but has not been able to go.  She states she has an appointment next month.  Pt playing on phone for entirety of visit. Mother present.  Overall, she states she is doing well since her last visit.   Pain Inventory Average Pain 0 Pain Right Now 0 My pain is No pain  In the last 24 hours, has pain interfered with the following? General activity 0 Relation with others 0 Enjoyment of life 0 What TIME of day is your pain at its worst? No pain Sleep (in general) Fair  Pain is worse with: No pain Pain improves with: No pain Relief from Meds: No pain  Mobility Do you have any goals in this area?  no  Function Do you have any goals in this area?  no  Neuro/Psych No problems in this area  Prior Studies Any changes since last visit?  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 See discussion above. Smaller focus of acute infarction affects the LEFT corpus callosum.     Electronically Signed   By: Elsie StainJohn T Curnes M.D.   On: 08/05/2016 16:42  Physicians involved in your care Any changes since last visit?  no   Family History  Problem Relation Age of Onset  . High blood pressure Mother   . Diabetes Mother   . Stroke Mother   . High blood pressure Father   . AAA (abdominal aortic aneurysm) Father    Social History   Social History  . Marital status: Single    Spouse name: N/A  . Number of children: N/A  . Years of education: N/A   Social History Main Topics  . Smoking status: Former Games developermoker  . Smokeless tobacco: Former NeurosurgeonUser  . Alcohol use No  . Drug use:   . Sexual activity: Yes     Other Topics Concern  . None   Social History Narrative  . None   History reviewed. No pertinent surgical history. Past Medical History:  Diagnosis Date  . Asthma    in fall due to allergies   BP 130/88 (BP Location: Left Arm, Patient Position: Sitting, Cuff Size: Large)   Pulse 88   Resp 14   SpO2 98%   Opioid Risk Score:   Fall Risk Score:  `1  Depression screen PHQ 2/9  Depression screen PHQ 2/9 08/29/2016  Decreased Interest 0  Down, Depressed, Hopeless 0  PHQ - 2 Score 0  Altered sleeping 0  Tired, decreased energy 1  Change in appetite 0  Feeling bad or failure about yourself  0  Trouble concentrating 1  Moving slowly or fidgety/restless 0  Suicidal thoughts 0  PHQ-9 Score 2     Review of Systems  HENT: Negative.   Eyes: Negative.   Respiratory: Negative.   Cardiovascular: Negative.   Gastrointestinal: Negative.   Endocrine: Negative.   Genitourinary: Negative.   Musculoskeletal: Negative.   Skin: Negative.   Allergic/Immunologic: Negative.   Neurological: Negative.  Negative for weakness.  Hematological: Negative.   Psychiatric/Behavioral: Negative.  Negative for confusion.  All other systems reviewed and are negative.  Objective:   Physical Exam  Constitutional: She is oriented to person, place, and time. She appears well-developed and well-nourished.  HENT:  Head: Normocephalic and atraumatic.  Eyes: Conjunctivae and EOM are normal. Pupils are equal, round, and reactive to light.  Neck: Normal range of motion. Neck supple.  Cardiovascular: Normal rate, regular rhythm, normal heart sounds and intact distal pulses.   Pulmonary/Chest: Effort normal and breath sounds normal.  Abdominal: Soft. Bowel sounds are normal.  Musculoskeletal: She exhibits no edema or tenderness.  Neurological: She is alert and oriented to person, place, and time.  Motor: 5/5 throughout Sensation intact to light touch CN II-XII grossly intact  A&Ox3 Difficult  with attention concentration, improving Short term call 3/3 after 2 min Cognitive tasks improving  Skin: Skin is warm and dry.  Psychiatric: Thought content normal. Her affect is blunt. Her speech is not slurred.  Patient did not speak unless spoken to.  Nursing note and vitals reviewed.     Assessment & Plan:  35 y.o.femalewith history of polysubstance presents for follow up of anoxic brain injury.  1. Anoxic encephalopathy  Begin SLP, however, given pt's improvement, she may not need for long  Pt doing well overall, significantly improved in last month, no longer on Ritalin.   Cont to abstain from substances  No driving at this time  2. Polysubstance abuse  Pt states she is abstaining from substances

## 2016-10-25 ENCOUNTER — Encounter

## 2016-10-25 ENCOUNTER — Encounter: Primary: Registered Nurse

## 2016-10-25 ENCOUNTER — Inpatient Hospital Stay: Attending: Orthopaedic Surgery | Primary: Registered Nurse

## 2016-10-25 DIAGNOSIS — M25461 Effusion, right knee: Secondary | ICD-10-CM

## 2016-10-26 ENCOUNTER — Inpatient Hospital Stay: Admit: 2016-10-26 | Attending: Orthopaedic Surgery | Primary: Registered Nurse

## 2016-10-26 ENCOUNTER — Ambulatory Visit
Admit: 2016-10-26 | Discharge: 2016-10-26 | Payer: PRIVATE HEALTH INSURANCE | Attending: Surgery | Primary: Registered Nurse

## 2016-10-26 DIAGNOSIS — M25461 Effusion, right knee: Secondary | ICD-10-CM

## 2016-10-26 DIAGNOSIS — Z6841 Body Mass Index (BMI) 40.0 and over, adult: Secondary | ICD-10-CM

## 2016-10-26 NOTE — Progress Notes (Signed)
BARIATRIC SURGERY OFFICE NOTE    SUBJECTIVE:    Patient presenting today referred from Gala MurdochSue A Carter, for   Chief Complaint   Patient presents with   ??? Weight Management     6th WM visit, diet, exercise, and pre-surgical weight loss.   .    Vitals:    10/26/16 1433   BP: (!) 119/50   Pulse: 75   Resp: 12        HPI: Linda Blevins is a 35 y.o. female  presenting in sixth bariatric visit, follow up diet and exercise - pre-operative weight loss, in consideration for bariatric surgery.    BMI: Body mass index is 46.44 kg/m??. Obesity Classification: III Morbid Obesity.    Weight History:   Wt Readings from Last 3 Encounters:   10/26/16 253 lb 14.4 oz (115.2 kg)   09/14/16 256 lb (116.1 kg)   08/12/16 259 lb 1.6 oz (117.5 kg)     Total weight loss/gain -2.5 Lbs over 6 week.      Patient dines out to a sit down restaurant 3 times per month.    Patient eats fast food meals 1 times per month.      Drinks mostly water    24 hour recall/food frequency chart:  Breakfast: Protein Shake  Snack: none  Lunch: tuna salad  Snack: none  Dinner: chicken breast on salad  Snack: none    Total daily calories: 1200 or less      Exercises: cardio and weights 2-3 times per week    DOING AMAZINGLY WELL - EGD Jan consent today for Sx:  Discussed with her in length on the risks benefits potential complications and possible alternatives of the procedure.  ??  We proceed with laparoscopic robotic-assisted sleeve gastrectomy possible hiatal hernia repair.  ??  Counseled in length regarding the 2 weeks Slim fast preoperative diet in the final stages post op bariatric diet.  ??  All questions answered to her satisfaction.  ??  Informed consent signed.       Thoroughly reviewed the patient's medical history, family history, social history and review of systems with the patient today in the office.  Please see medical record for pertinent positives.      Past Medical History:   Diagnosis Date   ??? Anxiety    ??? Depression    ??? Fatigue    ??? H/O  hysterectomy for benign disease 2012    endometriosis   ??? Obesity       Past Surgical History:   Procedure Laterality Date   ??? ADENOIDECTOMY     ??? HYSTERECTOMY     ??? TONSILLECTOMY       Current Outpatient Prescriptions   Medication Sig Dispense Refill   ??? loratadine (CLARITIN) 10 MG capsule Take 10 mg by mouth daily     ??? Multiple Vitamins-Minerals (THERAPEUTIC MULTIVITAMIN-MINERALS) tablet Take 1 tablet by mouth daily     ??? buPROPion (WELLBUTRIN XL) 300 MG extended release tablet Take 1 tablet by mouth every morning 30 tablet 3   ??? Cholecalciferol (VITAMIN D) 2000 UNITS TABS tablet Take 1 tablet by mouth daily 30 tablet 11     No current facility-administered medications for this visit.       Allergies   Allergen Reactions   ??? Ciprofloxacin    ??? Pcn [Penicillins] Hives   ??? Sulfa Antibiotics Hives   ??? Morphine And Related Nausea And Vomiting  Review of Systems   Constitutional: Positive for fatigue. Negative for activity change, chills, diaphoresis and fever.   HENT: Negative for sore throat, trouble swallowing and voice change.    Eyes: Negative for photophobia and visual disturbance.   Respiratory: Positive for shortness of breath. Negative for cough and wheezing.    Cardiovascular: Positive for leg swelling. Negative for chest pain and palpitations.   Gastrointestinal: Positive for abdominal distention and abdominal pain. Negative for anal bleeding, blood in stool, constipation, diarrhea, nausea and vomiting.   Endocrine: Positive for polyphagia. Negative for cold intolerance, heat intolerance, polydipsia and polyuria.   Genitourinary: Positive for urgency. Negative for dysuria, frequency and hematuria.   Musculoskeletal: Positive for arthralgias, back pain and gait problem. Negative for joint swelling, myalgias and neck stiffness.   Skin: Negative for color change and rash.   Neurological: Negative for seizures, speech difficulty, light-headedness and numbness.   Hematological: Negative for adenopathy.  Does not bruise/bleed easily.   Psychiatric/Behavioral: Positive for sleep disturbance. The patient is nervous/anxious.          OBJECTIVE:    BP (!) 119/50 (Site: Right Arm, Position: Sitting, Cuff Size: Large Adult)    Pulse 75    Resp 12    Ht 5\' 2"  (1.575 m)    Wt 253 lb 14.4 oz (115.2 kg)    BMI 46.44 kg/m??      Physical Exam   Constitutional: She is oriented to person, place, and time. She appears well-developed and well-nourished. No distress.   HENT:   Head: Normocephalic and atraumatic.   Eyes: Conjunctivae and EOM are normal. Pupils are equal, round, and reactive to light. No scleral icterus.   Neck: Normal range of motion. No JVD present. No tracheal deviation present. No thyromegaly present.   Cardiovascular: Normal rate, regular rhythm, normal heart sounds and intact distal pulses.  Exam reveals no gallop and no friction rub.    No murmur heard.  Pulmonary/Chest: Effort normal. No stridor. No respiratory distress. She has no wheezes. She has no rales. She exhibits no tenderness.   Abdominal: Soft. Bowel sounds are normal. She exhibits no distension and no mass. There is no tenderness. There is no rebound and no guarding.   Musculoskeletal: Normal range of motion. She exhibits no edema or tenderness.   Lymphadenopathy:     She has no cervical adenopathy.   Neurological: She is alert and oriented to person, place, and time. No cranial nerve deficit. Coordination normal.   Skin: Skin is warm and dry. No rash noted. She is not diaphoretic. No erythema. No pallor.   Psychiatric: Her behavior is normal. Judgment and thought content normal.   Vitals reviewed.        ASSESSMENT & PLAN:    1. Morbid obesity with BMI of 50.0-59.9, adult (HCC)       Counseled re the slimfast diet.    Patient was encouraged to journal all food intake. Keep calorie level at approximately 1200-1500. Protein intake is to be a minimum of 60-80 grams per day. Water drinking was encouraged with a goal of 64oz-128oz daily. Beverages to be  calorie free except for milk. Every other beverage should be water. They are to avoid soda.   Continue to increase level of physical activity.      More than 50% of the office visit today (25 min) was spent in face to face counseling regarding diet and exercise, in preparation for her planned Robotic Sleeve Gastrectomy.  Counting calories, complying with  the dietitian's recommendations, and complying with the preoperative workup including the dietitian counseling, exercise physiologist counseling, cardiologist evaluation and pre-operative optimization, pulmonologist evaluation and pre operative optimization, pre operative EGD evaluation.  The patient expressed understanding and willingness to comply nicely; all questions and concerns addressed in details.    Patient counseled on the risks, benefits, and alternatives of treatment plan at length while in the office today. Patient states an understanding and willingness to proceed with the plan.    Follow Up:  Return for Surgery.      Estill BakesMagued Keldrick Pomplun, MD, FACS, FICS  Member of the [employer]    [phone]    10/26/16

## 2016-11-15 ENCOUNTER — Telehealth

## 2016-11-15 NOTE — Telephone Encounter (Signed)
Will place referral for PT and EGD for bariatric clearance.

## 2016-11-25 ENCOUNTER — Ambulatory Visit
Admit: 2016-11-25 | Discharge: 2016-11-25 | Payer: PRIVATE HEALTH INSURANCE | Attending: Family | Primary: Registered Nurse

## 2016-11-25 DIAGNOSIS — Z6841 Body Mass Index (BMI) 40.0 and over, adult: Secondary | ICD-10-CM

## 2016-11-25 NOTE — Progress Notes (Signed)
BARIATRIC SURGERY OFFICE NOTE    SUBJECTIVE:    Patient presenting today referred from Linda Blevins, for   Chief Complaint   Patient presents with   ??? Follow-up     Patient is here for 7th follow up visit for diet, excercise and weight loss.   .    Vitals:    11/25/16 1035   BP: 114/60   Pulse: 88        BMI: Body mass index is 46.02 kg/m??. Obesity Classification: III Morbid Obesity.    Weight History:   Wt Readings from Last 3 Encounters:   11/25/16 251 lb 9.6 oz (114.1 kg)   10/26/16 253 lb 14.4 oz (115.2 kg)   09/14/16 256 lb (116.1 kg)       HPI: Linda Blevins is a 36 y.o. female Patient is here for their seventh bariatric visit, follow up diet and exercise - pre-operative weight loss, in consideration for bariatric surgery.    BMI: Body mass index is 46.02 kg/m??. Obesity Classification: Class III Morbid Obesity.    Weight History:   Wt Readings from Last 3 Encounters:   11/25/16 251 lb 9.6 oz (114.1 kg)   10/26/16 253 lb 14.4 oz (115.2 kg)   09/14/16 256 lb (116.1 kg)     Total weight loss/gain -2.3 Lbs over 1 month.      Patient dines out to a sit down restaurant 1 times per week.    Patient eats fast food meals 1 times per week.      Drinks mostly water or calorie free packets.    24 hour recall/food frequency chart:  Breakfast: protein shake  Snack: none  Lunch: chicken ceasar salad  Snack: none  Dinner: tilapia with rice and green beans  Snack: none    Total daily calories: 1200      Exercises yes 60 min 3 Times per week.    Continues to do weight training and cardio.  Weight loss at each visit.   EGD scheduled for 12/02/16.  Likes premier protein.   Depression/anxiety she continues Wellbutrin and doing well. No SI/HI.   Only needs PT and then completed.     Thoroughly reviewed the patient's medical history, family history, social history and review of systems with the patient today in the office.  Please see medical record for pertinent positives.      Past Medical History:   Diagnosis Date   ??? Anxiety     ??? Depression    ??? Fatigue    ??? H/O hysterectomy for benign disease 2012    endometriosis   ??? Obesity       Patient Active Problem List   Diagnosis   ??? Morbid obesity with BMI of 45.0-49.9, adult (HCC)   ??? Anxiety   ??? Reactive depression   ??? Gastroesophageal reflux disease without esophagitis   ??? SUI (stress urinary incontinence, female)   ??? Snores   ??? Arthritis   ??? Primary stabbing headache   ??? Dyslipidemia     Past Surgical History:   Procedure Laterality Date   ??? ADENOIDECTOMY     ??? HYSTERECTOMY     ??? TONSILLECTOMY       Current Outpatient Prescriptions   Medication Sig Dispense Refill   ??? loratadine (CLARITIN) 10 MG capsule Take 10 mg by mouth daily     ??? Multiple Vitamins-Minerals (THERAPEUTIC MULTIVITAMIN-MINERALS) tablet Take 1 tablet by mouth daily     ??? buPROPion (WELLBUTRIN XL) 300 MG extended  release tablet Take 1 tablet by mouth every morning 30 tablet 3   ??? Cholecalciferol (VITAMIN D) 2000 UNITS TABS tablet Take 1 tablet by mouth daily 30 tablet 11     No current facility-administered medications for this visit.       Allergies   Allergen Reactions   ??? Ciprofloxacin    ??? Pcn [Penicillins] Hives   ??? Sulfa Antibiotics Hives   ??? Morphine And Related Nausea And Vomiting           Review of Systems   Constitutional: Negative.  Negative for appetite change, fatigue and fever.   HENT: Negative for congestion, dental problem, hearing loss, rhinorrhea and trouble swallowing.    Eyes: Negative for pain.   Respiratory: Negative for chest tightness, shortness of breath and wheezing.    Cardiovascular: Negative for chest pain, palpitations and leg swelling.   Gastrointestinal: Negative for abdominal distention, abdominal pain, diarrhea and nausea.        GERD   Endocrine: Negative for cold intolerance and polydipsia.   Genitourinary: Negative for difficulty urinating and frequency.        SUI   Musculoskeletal: Negative for arthralgias and gait problem.   Skin: Negative for rash.   Allergic/Immunologic: Negative  for environmental allergies.   Neurological: Negative for dizziness, seizures and syncope.   Hematological: Does not bruise/bleed easily.   Psychiatric/Behavioral: Negative for behavioral problems and suicidal ideas. The patient is nervous/anxious.        OBJECTIVE:    BP 114/60    Pulse 88    Ht 5\' 2"  (1.575 m)    Wt 251 lb 9.6 oz (114.1 kg)    BMI 46.02 kg/m??      Physical Exam   Constitutional: She is oriented to person, place, and time. She appears well-developed and well-nourished.   Obese   HENT:   Head: Normocephalic and atraumatic.   Right Ear: Hearing and ear canal normal.   Left Ear: Hearing and ear canal normal.   Nose: Nose normal.   Mouth/Throat: Uvula is midline and oropharynx is clear and moist.   Eyes: Conjunctivae are normal. Pupils are equal, round, and reactive to light.   Neck: Normal range of motion.   Cardiovascular: Normal rate, regular rhythm and normal heart sounds.    Pulmonary/Chest: Effort normal and breath sounds normal. She has no decreased breath sounds. She has no wheezes. She has no rhonchi. She has no rales.   Abdominal: Soft. Bowel sounds are normal. There is no tenderness.   Musculoskeletal: Normal range of motion. She exhibits no tenderness.   In all 4 extremities.    Neurological: She is alert and oriented to person, place, and time. She has normal strength. She exhibits normal muscle tone. GCS eye subscore is 4. GCS verbal subscore is 5. GCS motor subscore is 6.   Skin: Skin is warm and dry. No rash noted.   Psychiatric: She has a normal mood and affect. Her behavior is normal. Judgment normal.   Nursing note and vitals reviewed.      ASSESSMENT & PLAN:    1. Morbid obesity with BMI of 45.0-49.9, adult (HCC)  - Continue weight loss at each visit.   - Remains motivated.   - Follow up in 1 month.   - Call with PT completed and EGD done.     2. Gastroesophageal reflux disease without esophagitis  - Will be further evaluated with EGD.   - Discussed importance of weight loss.   -  Follow with PCP for worsening symptoms.     3. Dyslipidemia  - Continue to follow.   - Weight loss recommended.     4. Anxiety  - Continue Wellbutrin daily 300 mg. No SI/HI.   - Follow up with any worsening symptoms.     5. SUI (stress urinary incontinence, female)  Stale, continue weight loss and management per PCP.        Patient was encouraged to journal all food intake. Keep calorie level at approximately 1200-1500. Protein intake is to be a minimum of 40-50 grams per day. Water drinking was encouraged with a goal of 64oz-128oz daily.     Only needs PT completed and EGD. Will call following PT assessment. Consent already obtained.     The patient expressed understanding and willingness to comply nicely; all questions and concerns addressed.    No orders of the defined types were placed in this encounter.    No orders of the defined types were placed in this encounter.      Follow Up:  Return in about 1 month (around 12/26/2016).    Camelia PhenesKAYLENE A BAKER, CNP

## 2016-11-25 NOTE — Progress Notes (Signed)
Patient is here for their seventh bariatric visit, follow up diet and exercise - pre-operative weight loss, in consideration for bariatric surgery.    BMI: Body mass index is 46.02 kg/m??. Obesity Classification: Class III Morbid Obesity.    Weight History:   Wt Readings from Last 3 Encounters:   11/25/16 251 lb 9.6 oz (114.1 kg)   10/26/16 253 lb 14.4 oz (115.2 kg)   09/14/16 256 lb (116.1 kg)     Total weight loss/gain -2.3 Lbs over 1 month.      Patient dines out to a sit down restaurant 1 times per week.    Patient eats fast food meals 1 times per week.      Drinks mostly water or calorie free packets.    24 hour recall/food frequency chart:  Breakfast: protein shake  Snack: none  Lunch: chicken ceasar salad  Snack: none  Dinner: tilapia with rice and green beans  Snack: none    Total daily calories: 1200      Exercises yes 60 min 3  Times per week.

## 2016-11-30 NOTE — Telephone Encounter (Signed)
Called patient to advise his/her bariatric surgery is scheduled for 12/30/16 @1030  (arrival 830) [facility], PAT/Phone assessment 12/23/16@8am , PreOp diet 12/15/16@1030a ,, weigh-in 12/29/16@1030 . Instructed NPO after midnight and discontinue all blood thinners at least 5 days prior.

## 2016-11-30 NOTE — Telephone Encounter (Signed)
Spoke with patient about her prep for EGD on 12/02/16, voiced understanding.

## 2016-12-01 NOTE — Patient Instructions (Signed)
Surgery:         12/02/2016@ 1145                 Arrival time: 0945  Nothing to eat or drink after midnight unless instructed to take certain medications by the doctor or the nurse the am of surgery  Arrive at the front information desk -1st floor /SDS is on [address]  Please leave money and all other valuables at home.  Wear comfortable clothing. If you wear contacts please bring a case.  No make up. You may be asked to remove rings or body piercing.  Please bring insurance cards and picture ID am of procedure.  Please bring any consent or paper work from your doctor.  If you become ill,such as a cold, sore throat or fever contact your doctor.  Please bathe or shower am of procedure.  Medications to take AM of procedure:   As directed by dr Ward GivensKhouzam  Any questions call SDS  [phone]

## 2016-12-02 ENCOUNTER — Inpatient Hospital Stay
Admit: 2016-12-02 | Discharge: 2016-12-19 | Payer: PRIVATE HEALTH INSURANCE | Attending: Surgery | Primary: Registered Nurse

## 2016-12-02 MED ORDER — LACTATED RINGERS IV SOLN
Freq: Once | INTRAVENOUS | Status: AC
Start: 2016-12-02 — End: 2016-12-02
  Administered 2016-12-02: 16:00:00 via INTRAVENOUS

## 2016-12-02 MED ORDER — PROPOFOL 200 MG/20ML IV EMUL
200 MG/20ML | INTRAVENOUS | Status: AC
Start: 2016-12-02 — End: ?

## 2016-12-02 MED ORDER — PROPOFOL 200 MG/20ML IV EMUL
20020 MG/20ML | INTRAVENOUS | Status: AC
Start: 2016-12-02 — End: ?

## 2016-12-02 MED FILL — PROPOFOL 200 MG/20ML IV EMUL: 200 MG/20ML | INTRAVENOUS | Qty: 20

## 2016-12-02 MED FILL — LACTATED RINGERS IV SOLN: INTRAVENOUS | Qty: 1000

## 2016-12-02 NOTE — Other (Signed)
Patient Acct Nbr:  [id number]  Primary AUTH/CERT:    Primary Insurance Company Name:   [employer]  Primary Insurance Plan Name:  [facility] PL[employer] PLAN EPO [id number]  Primary Insurance Group Number:  [id number]  Primary Insurance Plan Type: N  Primary Insurance Policy Number:  [id number]

## 2016-12-02 NOTE — Anesthesia Post-Procedure Evaluation (Signed)
Anesthesia Post-op Note    Patient: Linda Blevins  MRN: [id number]  Birthdate: 03/24/1981  Date of evaluation: 12/02/2016  Time:  11:43 AM     Procedure(s) Performed: EGD  Last Vitals: BP 119/74    Pulse 83    Temp 96.2 ??F (35.7 ??C)    Resp 16    Ht _0  (1.575 m)    Wt 251 lb (113.9 kg)    SpO2 96%    Breastfeeding? No    BMI 45.91 kg/m??     Aldrete Phase I: Aldrete Score: 10    Aldrete Phase II:      Anesthesia Post Evaluation    Final anesthesia type: MAC  Patient location during evaluation: procedure area  Patient participation: complete - patient participated  Level of consciousness: awake and alert  Pain score: 0  Airway patency: patent  Nausea & Vomiting: no nausea and no vomiting  Complications: no  Cardiovascular status: blood pressure returned to baseline  Respiratory status: acceptable  Hydration status: euvolemic        Florinda Marker, CRNA  11:43 AM

## 2016-12-02 NOTE — Anesthesia Pre-Procedure Evaluation (Signed)
Department of Anesthesiology  Preprocedure Note       Name:  Linda Blevins   Age:  36 y.o.  DOB:  11-17-1981                                          MRN:  [id number]         Date:  12/02/2016      Surgeon:    Procedure:    Medications prior to admission:   Prior to Admission medications    Medication Sig Start Date End Date Taking? Authorizing Provider   Multiple Vitamins-Minerals (THERAPEUTIC MULTIVITAMIN-MINERALS) tablet Take 1 tablet by mouth daily   Yes Historical Provider, MD   buPROPion (WELLBUTRIN XL) 300 MG extended release tablet Take 1 tablet by mouth every morning 05/06/16  Yes Georgina Pillion   Cholecalciferol (VITAMIN D) 2000 UNITS TABS tablet Take 1 tablet by mouth daily 12/21/15  Yes Georgina Pillion   loratadine (CLARITIN) 10 MG capsule Take 10 mg by mouth daily    Historical Provider, MD       Current medications:    No current facility-administered medications for this encounter.        Allergies:    Allergies   Allergen Reactions   ??? Ciprofloxacin Hives   ??? Pcn [Penicillins] Hives   ??? Sulfa Antibiotics Hives   ??? Morphine And Related Nausea And Vomiting       Problem List:    Patient Active Problem List   Diagnosis Code   ??? Morbid obesity with BMI of 45.0-49.9, adult ([place]) E66.01, Z68.42   ??? Anxiety F41.9   ??? Reactive depression F32.9   ??? Gastroesophageal reflux disease without esophagitis K21.9   ??? SUI (stress urinary incontinence, female) N39.3   ??? Snores R06.83   ??? Arthritis M19.90   ??? Primary stabbing headache G44.85   ??? Dyslipidemia E78.5       Past Medical History:        Diagnosis Date   ??? Anxiety    ??? Depression    ??? Fatigue    ??? H/O hysterectomy for benign disease 2012    endometriosis   ??? Obesity    ??? Sleep apnea     has sleep study 08/2016- does use cpap       Past Surgical History:        Procedure Laterality Date   ??? HYSTERECTOMY  2012    "they left one ovary"   ??? PELVIC LAPAROSCOPY  2011 and 2010    x2   ??? TONSILLECTOMY AND ADENOIDECTOMY  age 80   ??? WISDOM TOOTH EXTRACTION  age 43        Social History:    Social History   Substance Use Topics   ??? Smoking status: Never Smoker   ??? Smokeless tobacco: Never Used   ??? Alcohol use No                                Counseling given: Not Answered      Vital Signs (Current):   Vitals:    12/01/16 1001 12/02/16 1017   BP:  119/74   Pulse:  83   Resp:  16   Temp:  96.2 ??F (35.7 ??C)   SpO2:  96%   Weight: 251 lb (113.9 kg)  Height: _0  (1.575 m)                                               BP Readings from Last 3 Encounters:   12/02/16 119/74   11/25/16 114/60   10/26/16 (!) 119/50       NPO Status: Time of last liquid consumption: 1800                        Time of last solid consumption: 1800                        Date of last liquid consumption: 12/01/16                        Date of last solid food consumption: 12/01/16    BMI:   Wt Readings from Last 3 Encounters:   12/01/16 251 lb (113.9 kg)   11/25/16 251 lb 9.6 oz (114.1 kg)   10/26/16 253 lb 14.4 oz (115.2 kg)     Body mass index is 45.91 kg/m??.    Anesthesia Evaluation  Patient summary reviewed no history of anesthetic complications:   Airway: Mallampati: II  TM distance: >3 FB   Neck ROM: full  Mouth opening: > = 3 FB Dental: normal exam         Pulmonary:normal exam    (+) sleep apnea:                             Cardiovascular:             Beta Blocker:  Not on Beta Blocker         Neuro/Psych:   (+) headaches:, psychiatric history:            GI/Hepatic/Renal:   (+) GERD:, morbid obesity          Endo/Other:                     Abdominal:           Vascular:                                        Anesthesia Plan      MAC     ASA 2       Induction: intravenous.      Anesthetic plan and risks discussed with patient.      Plan discussed with attending.                  Florinda Marker, CRNA   12/02/2016

## 2016-12-02 NOTE — Op Note (Signed)
OPERATIVE / PROCEDURE NOTE    Linda Blevins, Linda Blevins, 1981/06/11, 36 y.o.,  female, CSN: [id number]  December 02, 2016    PREOPERATIVE DIAGNOSES:   1. Gastroesophageal reflux disease.  2. Morbid obesity, Body mass index is 45.91 kg/m.    POSTOPERATIVE DIAGNOSES:  Same - NORMAL EGD  FINDINGS: Esophagogastroduodenoscopy performed all the way through the third  portion of the duodenum. Z-line was at 40 cm from the greater incisors  level. No GERD stigmata noted. No obvious hiatal hernia. No changes  suspicious for herpetic esophagitis. No gastritis. No peptic ulcer disease.  No biliary reflux.  PROCEDURE DONE: Esophagogastroduodenoscopy all the way to the third portion  of the duodenum + Antral mucosal biopsies to rule out H pylori.  ASSISTANT: None.  ANESTHESIA: MAC per anesthesia.  SPECIMEN: None.  ESTIMATED BLOOD LOSS: None.  COMPLICATIONS: None.  CONDITION: Stable to the PACU.  OPERATIVE DESCRIPTION: After appropriate informed consent was signed by the  patient knowing all of the risks, benefits, potential complications, and  possible alternatives of the procedure, the patient was properly identified  in the holding area. He was taken to the GI suite and after institution of  gentle IV sedation, he was placed on his left lateral decubitus position in  reverse Trendelenburg position. The EGD Olympus scope was placed into his  mouth and under direct visualization was advanced. The vocal cords were  identified. No stigmata of reflux laryngitis noted. The esophagus was  intubated. Z-line was at 40 cm from the superior incisors level. No  stigmata of reflux esophagitis. No changes suspicious of Barretts  esophagitis noted. The stomach was intubated. Healthy looking mucosa was  identified. No ulcers. No gastritis. The pylorus was intubated all the way  to the third portion of the duodenum. No postpyloric abnormalities noted  including the ampulla and periampullary regions. The scope was retracted back  into the stomach  and retroflexed. No evidence of hiatal hernia identified.   No biliary reflux was noted.   2 Antral mucosal biopsies to rule out H pylori. Were taken and sent to the lab and pathology.  The stomach was decompressed and the scope was  retracted out uneventfully. The patient tolerated the procedure well without  any complication.   She takes proton pump inhibitors on a p.r.n. basis and he can continue doing  this. I believe morbid obesity is the culprit for her reflux symptoms and  when she loses the weight, this will undoubtedly resolve her   reflux symptoms.   The patient tolerated the procedure well without any complications and was  sent to PACU in stable condition.    ____________________________________________    Estill Bakes, MD, FACS, FICS    12/02/2016  11:38 AM

## 2016-12-02 NOTE — Discharge Instructions (Signed)
Upper GI Endoscopy: What to Expect at Home  Your Recovery  After you have an endoscopy, you will stay at the hospital or clinic for 1 to 2 hours. This will allow the medicine to wear off. You will be able to go home after your doctor or nurse checks to make sure you are not having any problems.  You may have to stay overnight if you had treatment during the test. You may have a sore throat for a day or two after the test.  This care sheet gives you a general idea about what to expect after the test.  How can you care for yourself at home?  Activity   Rest as much as you need to after you go home.   You should be able to go back to your usual activities the day after the test.  Diet   Follow your doctor's directions for eating after the test.   Drink plenty of fluids (unless your doctor has told you not to).  Medications   If you have a sore throat the day after the test, use an over-the-counter spray to numb your throat.  Follow-up care is a key part of your treatment and safety. Be sure to make and go to all appointments, and call your doctor if you are having problems. It's also a good idea to know your test results and keep a list of the medicines you take.  When should you call for help?  Call 911 anytime you think you may need emergency care. For example, call if:  ?  You passed out (loses consciousness).   ?  You have trouble breathing.   ?  You pass maroon or bloody stools.   ?Call your doctor now or seek immediate medical care if:  ?  You have pain that does not get better after your take pain medicine.   ?  You have new or worse belly pain.   ?  You have blood in your stools.   ?  You are sick to your stomach and cannot keep fluids down.   ?  You have a fever.   ?  You cannot pass stools or gas.   ?Watch closely for changes in your health, and be sure to contact your doctor if:  ?  Your throat still hurts after a day or two.   ?  You do not get better as expected.   Where can you learn  more?  Go to [email or web address] and sign in to your MyChart account. Enter J454 in the Search Health Information box to learn more about "Upper GI Endoscopy: What to Expect at Home."     If you do not have an account, please click on the "Sign Up Now" link.  Current as of: Apr 01, 2016  Content Version: 11.5   2006-2017 [employer]. Care instructions adapted under license by [facility]. If you have questions about a medical condition or this instruction, always ask your healthcare professional. [employer] disclaims any warranty or liability for your use of this information.

## 2016-12-02 NOTE — H&P (Signed)
HISTORY AND PHYSICAL EXAM    Date of Admission:  12/02/2016    PCP:  Linda MurdochSue A Blevins    Chief Complaint / History of Present Illness:  Linda DownsJessica S Blevins is a very pleasant 36 y.o. female who presents today for her preop EGD eval before her bariatric procedure.  As noted her Body mass index is 45.91 kg/m??. with significant co-morbidities as below.    The patient has been complying with the pre-operative workup, lifestyle modifications and changes with the bariatric clinic, including:  Dietitian evaluation and counseling, psychologist evaluation and counseling, Exercise physiologist evaluation and counseling, cardiac preoperative clearance and optimization, pulmonology preoperative clearance and optimization, today will proceed with preoperative EGD for GERD, morbid obesity: Body mass index is 45.91 kg/m??., in preparation for a bariatric procedure; to r/o GERD, Hiatal Hernia, Peptic Ulcer Disease, Gastroparesis, Esophageal dysmotility; etc.    All risks and benefits, potential complications and possible alternatives discussed, and agreeable to proceed.    PMH:   has a past medical history of Anxiety; Depression; Fatigue; H/O hysterectomy for benign disease (2012); Obesity; and Sleep apnea.    PSH:   has a past surgical history that includes Tonsillectomy and adenoidectomy (age 36); Hysterectomy (2012); pelvic laparoscopy (2011 and 2010); and Wisdom tooth extraction (age 36).    Allergies:    Allergies   Allergen Reactions   ??? Ciprofloxacin Hives   ??? Pcn [Penicillins] Hives   ??? Sulfa Antibiotics Hives   ??? Morphine And Related Nausea And Vomiting        Home Meds:    Prior to Admission medications    Medication Sig Start Date End Date Taking? Authorizing Provider   Multiple Vitamins-Minerals (THERAPEUTIC MULTIVITAMIN-MINERALS) tablet Take 1 tablet by mouth daily   Yes Historical Provider, MD   buPROPion (WELLBUTRIN XL) 300 MG extended release tablet Take 1 tablet by mouth every morning 05/06/16  Yes Linda MurdochSue A Blevins    Cholecalciferol (VITAMIN D) 2000 UNITS TABS tablet Take 1 tablet by mouth daily 12/21/15  Yes Linda MurdochSue A Blevins   loratadine (CLARITIN) 10 MG capsule Take 10 mg by mouth daily    Historical Provider, MD        Hospital Meds:    No current facility-administered medications for this encounter.          Social History / Family History:        Social History     Social History   ??? Marital status: Married     Spouse name: N/A   ??? Number of children: N/A   ??? 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 Smoking status: Never Smoker   ??? Smokeless tobacco: Never Used   ??? Alcohol use No   ??? Drug use: No   ??? Sexual activity: Yes     Partners: Male     Other Topics Concern   ??? None     Social History Narrative   ??? None       Review of Systems:  Constitutional: Negative for fever, chills, diaphoresis, appetite change and fatigue.   HENT: Negative for sore throat, trouble swallowing and voice change.   Respiratory: Negative for cough, positive for shortness of breath no wheezing.   Cardiovascular: Negative for chest pain positive for SOB with one flight of stairs exertion, no pitting LE edema.   Gastrointestinal: Negative for nausea, vomiting, diarrhea, constipation, blood  in stool, abdominal distention, anal bleeding or rectal pain.   Musculoskeletal: Negative for joint swelling and arthralgias.   Skin: Warm and dry, well perfused.  Neurological: Negative for seizures, syncope, speech difficulty and weakness.   Hematological/Lymphatic: Negative for adenopathy. No history of DVT/PE. Does not bruise/bleed easily.   Psychiatric/Behavioral: Negative for agitation.         Physical Exam:  Vital Signs:   Vitals:    12/02/16 1017   BP: 119/74   Pulse: 83   Resp: 16   Temp: 96.2 ??F (35.7 ??C)   SpO2: 96%     General appearance: Pt Alert and oriented, in no apparent acute distress.  HEENT:  PERLA, EOMI, No JVDs, Bruits, Megalies.  Lungs: Clear to auscultation  bilaterally.  Heart: Regular rate and rhythm, S1, S2 normal, no murmur, rub or gallop.  Abdomen: Non tender. Non distended. Positive bowel sounds. No hernias noted, no masses palpable.  Extremities: Warm, well perfused, no cyanosis or edema.  Skin: Skin color, texture, turgor normal.  Neurologic: Grossly normal, Cranial nerves from II to XII intact.       Radiologic / Imaging / TESTING  No results found.      Labs:    No results found for this or any previous visit (from the past 24 hour(s)).      Diagnosis:  Patient Active Problem List   Diagnosis   ??? Morbid obesity with BMI of 45.0-49.9, adult (HCC)   ??? Anxiety   ??? Reactive depression   ??? Gastroesophageal reflux disease without esophagitis   ??? SUI (stress urinary incontinence, female)   ??? Snores   ??? Arthritis   ??? Primary stabbing headache   ??? Dyslipidemia       Assessment & Plan:    Linda Blevins is a very pleasant 36 y.o. female who presents today for her preop EGD eval before her bariatric procedure.  As noted her Body mass index is 45.91 kg/m??. with significant co-morbidities as below.    The patient has been complying with the pre-operative workup, lifestyle modifications and changes with the bariatric clinic, including:  Dietitian evaluation and counseling, psychologist evaluation and counseling, Exercise physiologist evaluation and counseling, cardiac preoperative clearance and optimization, pulmonology preoperative clearance and optimization, today will proceed with preoperative EGD for GERD, morbid obesity: Body mass index is 45.91 kg/m??., in preparation for a bariatric procedure; to r/o GERD, Hiatal Hernia, Peptic Ulcer Disease, Gastroparesis, Esophageal dysmotility; etc.    All risks and benefits, potential complications and possible alternatives discussed, and agreeable to proceed.    ____________________________________________    Estill Bakes, MD, FACS, FICS  Linda Laine, CNP    12/02/2016  11:26 AM

## 2016-12-03 LAB — CLO TEST: Clotest: NEGATIVE

## 2016-12-06 ENCOUNTER — Encounter: Primary: Registered Nurse

## 2016-12-13 ENCOUNTER — Inpatient Hospital Stay: Primary: Registered Nurse

## 2016-12-13 NOTE — Other (Signed)
Physical Therapy  Cancellation/No-show Note  Patient Name:  Linda DownsJessica S Weyland  DOB:  1981-11-12   Date:  12/13/2016  Cancelled visits to date: 0  No-shows to date: 1    For today's appointment patient:  []   Cancelled  []   Rescheduled appointment  [x]   No-show for Initial Evaluation     Reason given by patient:  []   Patient ill  []   Conflicting appointment  []   No transportation    []   Conflict with work  []   No reason given  []   Other:     Comments:      Electronically signed by:  Graciella Beltonangi N Sisler, PT, 12/13/2016, 10:44 AM

## 2016-12-15 ENCOUNTER — Ambulatory Visit: Admit: 2016-12-15 | Discharge: 2016-12-15 | Payer: PRIVATE HEALTH INSURANCE | Primary: Registered Nurse

## 2016-12-15 DIAGNOSIS — Z6841 Body Mass Index (BMI) 40.0 and over, adult: Secondary | ICD-10-CM

## 2016-12-15 NOTE — Progress Notes (Signed)
Outpatient Nutrition Counseling    REASON FOR VISIT: Pre Op Diet teaching    Chief Complaint:    Chief Complaint   Patient presents with   ??? Weight Management       SUBJECTIVE:  Pt here to start 2 week liquid protein liver shrinking diet. Pt instructed on 2 week pre-op diet and expected weight loss. Instructed on complete post-op diet progression including vitamin/mineral and exercise guidelines. Pt voiced understanding to all.   The patient is a 36 y.o. female being seen for morbid obesity, planning weight loss surgery 12/30/16; Angelys's, Height: 5\' 2"  (157.5 cm), Weight: 253 lb 6.4 oz (114.9 kg), Current Body mass index is 46.35 kg/m??.   The patient's PCP is Gala MurdochSue A Carter     Comorbid Conditions:  Significant diseases affecting this patient are   Past Medical History:   Diagnosis Date   ??? Anxiety    ??? Depression    ??? Fatigue    ??? H/O hysterectomy for benign disease 2012    endometriosis   ??? Obesity    ??? Sleep apnea     has sleep study 08/2016- does use cpap   .    Review of Systems - ROS  Otherwise per HPI.    Allergies:  Allergies   Allergen Reactions   ??? Ciprofloxacin Hives   ??? Pcn [Penicillins] Hives   ??? Sulfa Antibiotics Hives   ??? Morphine And Related Nausea And Vomiting       Past Surgical History:  Past Surgical History:   Procedure Laterality Date   ??? HYSTERECTOMY  2012    "they left one ovary"   ??? PELVIC LAPAROSCOPY  2011 and 2010    x2   ??? TONSILLECTOMY AND ADENOIDECTOMY  age 36   ??? WISDOM TOOTH EXTRACTION  age 36       Family History:  Family History   Problem Relation Age of Onset   ??? Asthma Mother    ??? Hypertension Father    ??? High Blood Pressure Father        Social History:  Social History     Social History   ??? Marital status: Married     Spouse name: N/A   ??? Number of children: N/A   ??? 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 Smoking status: Never Smoker   ??? Smokeless tobacco: Never Used   ??? Alcohol use No   ??? Drug use: No   ???  Sexual activity: Yes     Partners: Male     Other Topics Concern   ??? Not on file     Social History Narrative   ??? No narrative on file         OBJECTIVE:  Physical Exam   BP 127/61 (Site: Right Arm, Position: Sitting, Cuff Size: Medium Adult)    Pulse 78    Ht 5\' 2"  (1.575 m)    Wt 253 lb 6.4 oz (114.9 kg)    BMI 46.35 kg/m??        NUTRITION DIAGNOSIS: Overweight / Obesity   Problem: Increased adiposity compared to reference standard or established norms   Etiology: Excess intake compared to output over time   S/S: Ht: 62" Wt: 253.4 lbs BMI: 46.35    NUTRITION INTERVENTIONS:    Individualized treatment goals to address nutrition diagnosis:   Instructed on pre and post-op  diet for planned Sleeve gastrectomy   Provided sample menus, food lists, recipes, food journal and meal planning handouts   Encouraged Physical activity as approved by physician    MONITORING/ EVALUATION/ PLAN:   Pt verbalized understanding of all materials covered   Pt asked pertinent questions throughout the session - expect compliance with nutrition guidelines presented   Provided pt with contact information should questions arise prior to next visit   Will f/u with pt in 2 weeks for weigh-in  Cristi Loron MS, RDN, LD  12/15/2016

## 2016-12-19 NOTE — Telephone Encounter (Signed)
Called Linda DownsJessica S Blevins to check on her slim fast diet for surgery. Doing very well, no issues or concerns.

## 2016-12-20 NOTE — Anesthesia Pre-Procedure Evaluation (Signed)
Pre-Admission Testing   Pre-Procedural Screening   NP Note    PMH:  Past Medical History:   Diagnosis Date   ??? Anxiety    ??? Depression    ??? Fatigue    ??? H/O hysterectomy for benign disease 2012    endometriosis   ??? Obesity    ??? Sleep apnea     has sleep study 08/2016- does use cpap       PSH:    Past Surgical History:   Procedure Laterality Date   ??? HYSTERECTOMY  2012    "they left one ovary"   ??? PELVIC LAPAROSCOPY  2011 and 2010    x2   ??? TONSILLECTOMY AND ADENOIDECTOMY  age 44   ??? WISDOM TOOTH EXTRACTION  age 73        Current Medications:   Not in a hospital admission.    Allergies:   Allergies   Allergen Reactions   ??? Ciprofloxacin Hives   ??? Pcn [Penicillins] Hives   ??? Sulfa Antibiotics Hives   ??? Morphine And Related Nausea And Vomiting             Social History:  Social History     Social History   ??? Marital status: Married     Spouse name: N/A   ??? Number of children: N/A   ??? 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 Smoking status: Never Smoker   ??? Smokeless tobacco: Never Used   ??? Alcohol use No   ??? Drug use: No   ??? Sexual activity: Yes     Partners: Male     Other Topics Concern   ??? Not on file     Social History Narrative   ??? No narrative on file        Recent Vitals:  Vitals:    12/21/16 0749   BP: 120/70   Pulse: 76   Resp: 16   Temp: 98.4 ??F (36.9 ??C)   SpO2: 96%     Wt Readings from Last 3 Encounters:   12/15/16 253 lb 6.4 oz (114.9 kg)   12/01/16 251 lb (113.9 kg)   11/25/16 251 lb 9.6 oz (114.1 kg)      Ht Readings from Last 3 Encounters:   12/15/16 5\' 2"  (1.575 m)   12/01/16 5\' 2"  (1.575 m)   11/25/16 5\' 2"  (1.575 m)     There is no height or weight on file to calculate BMI.    Wt Readings from Last 3 Encounters:   12/15/16 253 lb 6.4 oz (114.9 kg)   12/01/16 251 lb (113.9 kg)   11/25/16 251 lb 9.6 oz (114.1 kg)       Present on Admission:  **None**       Anesthesia Alerts:  NO      Malignant Hyperthermia:  NO     History of  Sleep Apnea:   Yes.  Uses CPAP?  Yes    REVIEW OF SYSTEMS:      EYES: contacts    HEENT:   Neg     RESPIRATORY: OSA, wears CPAP  Non smoker  Able to lie flat.  Yes     CARDIOVASCULAR: denies CP, SOB, palpitations, and dizziness.    GASTROINTESTINAL:  Denies reflux, hiatal hernia, Hx obesity with failed diets. On pre-op liquid protein diet. Started 12/16/2016  drinks 4  shakes per  day.  Water intake: 64 oz. Counseled to have minimum of 64 oz water per day.     GENITOURINARY:  Neg     INTEGUMENT:  neg    BREAST/GYN:  S/p hysterectomy, 2012.     HEMATOLOGIC/LYMPHATIC: neg    ENDOCRINE: neg     MUSCULOSKELETAL: full ROM    NEUROLOGICAL:  Neg     BEHAVIOR/PSYCH:  Depression, anxiety  Alert and oriented x 4. Questions answered. Understanding verbalized    PHYSICAL EXAM:  Airway Exam:  Head/Neck movement: full  Thyromental Distance: > 3 FB  History of difficult intubation:  None  Mallampati Classification:  Class II, small mouth opening   Teeth: missing upper and lower molars.        LUNGS:  No increased work of breathing, good air exchange, clear to auscultation bilaterally, no crackles or wheezing  CARDIOVASCULAR:  Normal apical impulse, regular rate and rhythm, normal S1 and S2, no S3 or S4, and no murmur noted    Testing Results:    EKG:  None per anesthesia protocol    LABS:    CBC  Lab Results   Component Value Date/Time    WBC 8.1 12/18/2015 04:36 AM    HGB 14.4 12/18/2015 04:36 AM    HCT 43.5 12/18/2015 04:36 AM    PLT 303 12/18/2015 04:36 AM     RENAL  Lab Results   Component Value Date/Time    NA 135 12/18/2015 04:36 AM    K 4.0 12/18/2015 04:36 AM    CL 98 (L) 12/18/2015 04:36 AM    CO2 25 12/18/2015 04:36 AM    BUN 16 12/18/2015 04:36 AM    CREATININE 0.8 12/18/2015 04:36 AM    GLUCOSE 109 02/22/2016 08:45 AM       COAGS  No results found for: PROTIME, INR, APTT    Summary:  Chart reviewed, pt. Interviewed and examined.   Planned surgical procedure:  Robotic Laparoscopic Gastrectomy Sleeve, Hiatal Hernia Repair  per Dr. Ward GivensKhouzam.    1.  Cardiac evaluation per Dr. Erle CrockerKabir. Low risk.  No known cardiac condition. B/p: WNL.  Denies CP or SOB.  Exercises 3 days per week cardio and weights.     2.  Pulmonary evaluation per Dr. Allena NapoleonBashir.  OSA, wears CPAP. Instructed to bring machine DOS. Non smoker. Able to lie flat.      3.  Denies reflux, hiatal hernia, Hx obesity with failed diets. On pre-op liquid protein diet. Started 12/16/2016  drinks 4  shakes per day.  Water intake: 64 oz. Counseled to have minimum of 64 oz water per day.    4.  S/p hysterectomy, 2012.     5.  Stable depression, anxiety. On Wellbutrin.     Anesthesia Evaluation will follow by a certified practitioner prior to surgery.

## 2016-12-21 ENCOUNTER — Inpatient Hospital Stay: Admit: 2016-12-21 | Attending: Surgery | Primary: Registered Nurse

## 2016-12-21 LAB — BASIC METABOLIC PANEL
Anion Gap: 20 — ABNORMAL HIGH (ref 4–16)
BUN: 17 MG/DL (ref 6–23)
CO2: 24 MMOL/L (ref 21–32)
Calcium: 9.5 MG/DL (ref 8.3–10.6)
Chloride: 100 mMol/L (ref 99–110)
Creatinine: 0.9 MG/DL (ref 0.6–1.1)
GFR African American: >60 mL/min/{1.73_m2} (ref >60)
GFR Non-African American: >60 mL/min/{1.73_m2} (ref >60)
Glucose: 83 MG/DL (ref 70–99)
Potassium: 4.3 MMOL/L (ref 3.5–5.1)
Sodium: 144 MMOL/L (ref 135–145)

## 2016-12-21 LAB — CBC
Hematocrit: 43 % (ref 37–47)
Hemoglobin: 14.7 GM/DL (ref 12.5–16.0)
MCH: 31.3 PG — ABNORMAL HIGH (ref 27–31)
MCHC: 34.2 % (ref 32.0–36.0)
MCV: 91.5 FL (ref 78–100)
MPV: 10.3 FL (ref 7.5–11.1)
Platelets: 258 10*3/uL (ref 140–440)
RBC: 4.7 10*6/uL (ref 4.2–5.4)
RDW: 11.4 % — ABNORMAL LOW (ref 11.7–14.9)
WBC: 6 10*3/uL (ref 4.0–10.5)

## 2016-12-21 NOTE — Patient Instructions (Signed)
See scanned instruction form, instructed to bring CPAP per anesthesia department

## 2016-12-23 ENCOUNTER — Encounter: Attending: Family | Primary: Registered Nurse

## 2016-12-23 ENCOUNTER — Encounter: Primary: Registered Nurse

## 2016-12-28 ENCOUNTER — Ambulatory Visit: Admit: 2016-12-28 | Discharge: 2016-12-28 | Payer: PRIVATE HEALTH INSURANCE | Primary: Registered Nurse

## 2016-12-28 DIAGNOSIS — Z6841 Body Mass Index (BMI) 40.0 and over, adult: Secondary | ICD-10-CM

## 2016-12-28 NOTE — Progress Notes (Signed)
Outpatient Nutrition Counseling    REASON FOR VISIT: Pre-Op Weigh-In    Chief Complaint:    Chief Complaint   Patient presents with   ??? Weight Management       SUBJECTIVE:  Pt here to weigh-in prior to scheduled Sleeve Gastrectomy 12/30/2016.  Pt lost 13.7 lbs on 2 week liver shrinking diet. Questions answered regarding vitamins. Will notify physician of weight loss.     The patient is a 36 y.o. female being seen for morbid obesity, planning weight loss surgery; Linda Blevins's, Height: 5\' 2"  (157.5 cm), Weight: 239 lb 11.2 oz (108.7 kg), Current Body mass index is 43.84 kg/m??.   The patient's PCP is Gala MurdochSue A Carter     Comorbid Conditions:  Significant diseases affecting this patient are   Past Medical History:   Diagnosis Date   ??? Anxiety    ??? Back pain     "Sometimes"   ??? Depression    ??? Fatigue    ??? Morbid obesity (HCC)    ??? Sleep apnea     Uses CPAP   ??? Teeth missing     Upper And Lower   ??? Wears contact lenses     Bilateral    ??? Wears glasses    ??? Wisdom teeth extracted     4 Wisdom Teeth Extracted In Past   .    Review of Systems - ROS  Otherwise per HPI.    Allergies:  Allergies   Allergen Reactions   ??? Ciprofloxacin Hives   ??? Morphine And Related Nausea And Vomiting   ??? Pcn [Penicillins] Hives   ??? Sulfa Antibiotics Hives       Past Surgical History:  Past Surgical History:   Procedure Laterality Date   ??? DENTAL SURGERY      Teeth Extracted In Past   ??? ENDOSCOPY, COLON, DIAGNOSTIC  11/2016   ??? HYSTERECTOMY VAGINAL  2012   ??? LAPAROSCOPY  2011    "Twice"   ??? TONSILLECTOMY AND ADENOIDECTOMY  2014   ??? WISDOM TOOTH EXTRACTION      4 Wisdom Teeth Extracted In Past       Family History:  Family History   Problem Relation Age of Onset   ??? Asthma Mother    ??? Depression Mother    ??? High Blood Pressure Mother    ??? High Blood Pressure Father    ??? High Cholesterol Father        Social History:  Social History     Social History   ??? Marital status: Married     Spouse name: N/A   ??? Number of children: N/A   ??? 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 Smoking status: Never Smoker   ??? Smokeless tobacco: Never Used   ??? Alcohol use No   ??? Drug use: No   ??? Sexual activity: Yes     Partners: Male     Other Topics Concern   ??? Not on file     Social History Narrative   ??? No narrative on file         OBJECTIVE:  Physical Exam   BP 120/74 (Site: Right Arm, Position: Sitting, Cuff Size: Large Adult)    Pulse 85    Ht 5\' 2"  (1.575 m)    Wt 239 lb 11.2 oz (108.7 kg)    BMI 43.84  kg/m??        NUTRITION DIAGNOSIS: Overweight / Obesity   Problem: Increased adiposity compared to reference standard or established norms   Etiology: Excess intake compared to output over time   S/S: Ht: 62" Wt: 239.7 lbs BMI: 43.84    NUTRITION INTERVENTIONS:    Individualized treatment goals to address nutrition diagnosis:   Instructed on continuing liquid diet until tomorrow   Provided vitamin samples   Encouraged Physical activity as approved by physician    MONITORING/ EVALUATION/ PLAN:   Pt verbalized understanding of all materials covered   Pt asked pertinent questions throughout the session - expect compliance with nutrition guidelines presented   Provided pt with contact information should questions arise prior to next visit   Will f/u with pt post-op  J. Jinelle Butchko MS, RDN, LD  12/28/2016

## 2016-12-29 ENCOUNTER — Encounter: Attending: Family | Primary: Registered Nurse

## 2016-12-29 NOTE — H&P (Addendum)
HISTORY AND PHYSICAL EXAM    Date of Admission:  12/30/2016    PCP:  Gala MurdochSue A Carter, CNP    Chief Complaint / History of Present Illness:  Linda DownsJessica S Jenny is a very pleasant 36 y.o. female who presents today for her bariatric procedure.  As noted her Body mass index is 43.53 kg/m??. with significant co-morbidities as below.    The patient has been complying with the pre-operative workup, lifestyle modifications and changes with the bariatric clinic, including:  Dietitian evaluation and counseling, psychologist evaluation and counseling, Exercise physiologist evaluation and counseling, cardiac preoperative clearance and optimization, pulmonology preoperative clearance and optimization, her preoperative EGD for GERD, revealed no obvious endoscopic Hiatal Hernia.  she complied with the protein liquid diet for 2 weeks and successfully lost ~14 Lbs; will proceed today with robotic laparoscopic sleeve gastrectomy and possible hiatal herniorrhaphy.    All risks and benefits, potential complications and possible alternatives discussed, and agreeable to proceed.    PMH:   has a past medical history of Anxiety; Back pain; Depression; Fatigue; Morbid obesity (HCC); Sleep apnea; Teeth missing; Wears contact lenses; Wears glasses; and Wisdom teeth extracted.    PSH:   has a past surgical history that includes Tonsillectomy and adenoidectomy (2014); Wisdom tooth extraction; Dental surgery; Endoscopy, colon, diagnostic (11/2016); HYSTERECTOMY VAGINAL (2012); and laparoscopy (2011).    Allergies:    Allergies   Allergen Reactions   ??? Ciprofloxacin Hives   ??? Morphine And Related Nausea And Vomiting   ??? Pcn [Penicillins] Hives   ??? Sulfa Antibiotics Hives        Home Meds:    Prior to Admission medications    Medication Sig Start Date End Date Taking? Authorizing Provider   Cholecalciferol (VITAMIN D3 PO) Take by mouth nightly Over The Counter   Yes Historical Provider, MD   Multiple Vitamins-Minerals (MULTIVITAMIN PO) Take by  mouth nightly Over The Counter   Yes Historical Provider, MD   buPROPion (WELLBUTRIN XL) 300 MG extended release tablet Take 1 tablet by mouth every morning 05/06/16  Yes Gala MurdochSue A Carter, CNP   loratadine (CLARITIN) 10 MG capsule Take 10 mg by mouth as needed Over The Counter    Historical Provider, MD        Hospital Meds:    Current Facility-Administered Medications   Medication Dose Route Frequency Provider Last Rate Last Dose   ??? ceFAZolin (ANCEF) 2 g in dextrose 4 % 100 mL IVPB (premix)  2 g Intravenous Once Ilene QuaMagued N Deavion Dobbs, MD 200 mL/hr at 12/30/16 0934 2 g at 12/30/16 0934   ??? metronidazole (FLAGYL) 500 mg in NaCl 100 mL IVPB premix  500 mg Intravenous Once Ilene QuaMagued N Jesyka Slaght, MD 100 mL/hr at 12/30/16 0934 500 mg at 12/30/16 0934   ??? fentaNYL (SUBLIMAZE) injection 25 mcg  25 mcg Intravenous Q5 Min PRN Mickel Duhamelhomas M Brady, DO       ??? fentaNYL (SUBLIMAZE) injection 50 mcg  50 mcg Intravenous Q5 Min PRN Mickel Duhamelhomas M Brady, DO       ??? HYDROmorphone (DILAUDID) injection 0.5 mg  0.5 mg Intravenous Q5 Min PRN Mickel Duhamelhomas M Brady, DO       ??? ondansetron [facility](ZOFRAN) injection 4 mg  4 mg Intravenous Once PRN Mickel Duhamelhomas M Brady, DO       ??? labetalol (NORMODYNE;TRANDATE) injection 5 mg  5 mg Intravenous Q10 Min PRN Mickel Duhamelhomas M Brady, DO       ??? hydrALAZINE (APRESOLINE) injection 5 mg  5 mg Intravenous  Q10 Min PRN Mickel Duhamel, DO       ??? meperidine (DEMEROL) injection 12.5 mg  12.5 mg Intravenous Q5 Min PRN Mickel Duhamel, DO           Social History / Family History:        Social History     Social History   ??? Marital status: Married     Spouse name: N/A   ??? Number of children: N/A   ??? 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 Smoking status: Never Smoker   ??? Smokeless tobacco: Never Used   ??? Alcohol use No   ??? Drug use: No   ??? Sexual activity: Yes     Partners: Male     Other Topics Concern   ??? None     Social History Narrative   ??? None       Review of  Systems:  Constitutional: Negative for fever, chills, diaphoresis, appetite change and fatigue.   HENT: Negative for sore throat, trouble swallowing and voice change.   Respiratory: Negative for cough, positive for shortness of breath no wheezing.   Cardiovascular: Negative for chest pain positive for SOB with one flight of stairs exertion, no pitting LE edema.   Gastrointestinal: Negative for nausea, vomiting, diarrhea, constipation, blood in stool, abdominal distention, anal bleeding or rectal pain.   Musculoskeletal: Negative for joint swelling and arthralgias.   Skin: Warm and dry, well perfused.  Neurological: Negative for seizures, syncope, speech difficulty and weakness.   Hematological/Lymphatic: Negative for adenopathy. No history of DVT/PE. Does not bruise/bleed easily.   Psychiatric/Behavioral: Negative for agitation.         Physical Exam:  Vital Signs:   Vitals:    12/30/16 0858   BP: 121/64   Pulse: 71   Resp: 16   Temp: 97.1 ??F (36.2 ??C)   SpO2: 96%     General appearance: Pt Alert and oriented, in no apparent acute distress.  HEENT:  PERLA, EOMI, No JVDs, Bruits, Megalies.  Lungs: Clear to auscultation bilaterally.  Heart: Regular rate and rhythm, S1, S2 normal, no murmur, rub or gallop.  Abdomen: Non tender. Non distended. Positive bowel sounds. No hernias noted, no masses palpable.  Extremities: Warm, well perfused, no cyanosis or edema.  Skin: Skin color, texture, turgor normal.  Neurologic: Grossly normal, Cranial nerves from II to XII intact.       Radiologic / Imaging / TESTING  No results found.      Labs:    No results found for this or any previous visit (from the past 24 hour(s)).      Diagnosis:  Patient Active Problem List   Diagnosis   ??? Morbid obesity with BMI of 45.0-49.9, adult (HCC)   ??? Anxiety   ??? Reactive depression   ??? Gastroesophageal reflux disease without esophagitis   ??? SUI (stress urinary incontinence, female)   ??? Snores   ??? Arthritis   ??? Primary stabbing headache   ???  Dyslipidemia           Assessment & Plan:    NAI BORROMEO is a very pleasant 36 y.o. female who presents today for her bariatric procedure.  As noted her Body mass index is 43.53 kg/m??. with significant co-morbidities as below.    The patient has been complying with the pre-operative workup, lifestyle modifications and  changes with the bariatric clinic, including:  Dietitian evaluation and counseling, psychologist evaluation and counseling, Exercise physiologist evaluation and counseling, cardiac preoperative clearance and optimization, pulmonology preoperative clearance and optimization, her preoperative EGD for GERD, revealed no obvious endoscopic Hiatal Hernia.  she complied with the protein liquid diet for 2 weeks and successfully lost ~14 Lbs; will proceed today with robotic laparoscopic sleeve gastrectomy and possible hiatal herniorrhaphy.    All risks and benefits, potential complications and possible alternatives discussed, and agreeable to proceed.    ____________________________________________    Estill Bakes, MD, FACS, FICS    12/30/2016  9:50 AM

## 2016-12-30 ENCOUNTER — Inpatient Hospital Stay
Admit: 2016-12-30 | Discharge: 2017-01-01 | Disposition: A | Discharge status: Home / Self Care | Payer: PRIVATE HEALTH INSURANCE | Source: Ambulatory Visit | Attending: Surgery | Admitting: Surgery

## 2016-12-30 DIAGNOSIS — Z6841 Body Mass Index (BMI) 40.0 and over, adult: Secondary | ICD-10-CM

## 2016-12-30 MED ORDER — DEXAMETHASONE SODIUM PHOSPHATE 4 MG/ML IJ SOLN
4 | INTRAMUSCULAR | Status: AC
Start: 2016-12-30 — End: 2016-12-30

## 2016-12-30 MED ORDER — HYDRALAZINE HCL 20 MG/ML IJ SOLN
20 MG/ML | INTRAMUSCULAR | Status: DC | PRN
Start: 2016-12-30 — End: 2016-12-30

## 2016-12-30 MED ORDER — POTASSIUM CHLORIDE 10 MEQ/100ML IV SOLN
10100 MEQ/0ML | INTRAVENOUS | Status: DC | PRN
Start: 2016-12-30 — End: 2017-01-01

## 2016-12-30 MED ORDER — HYDROMORPHONE HCL 2 MG/ML IJ SOLN
2 MG/ML | INTRAMUSCULAR | Status: DC | PRN
Start: 2016-12-30 — End: 2016-12-30

## 2016-12-30 MED ORDER — BUPROPION HCL ER (XL) 150 MG PO TB24
150 MG | Freq: Every morning | ORAL | Status: DC
Start: 2016-12-30 — End: 2017-01-01
  Administered 2017-01-01: 15:00:00 300 mg via ORAL

## 2016-12-30 MED ORDER — CEFAZOLIN SODIUM-DEXTROSE 2-4 GM/100ML-% IV SOLN
2-4 | Freq: Three times a day (TID) | INTRAVENOUS | Status: AC
Start: 2016-12-30 — End: 2016-12-31
  Administered 2016-12-30 – 2016-12-31 (×3): 2 g via INTRAVENOUS

## 2016-12-30 MED ORDER — FENTANYL CITRATE (PF) 100 MCG/2ML IJ SOLN
100 MCG/2ML | INTRAMUSCULAR | Status: DC | PRN
Start: 2016-12-30 — End: 2016-12-30

## 2016-12-30 MED ORDER — LACTATED RINGERS IV SOLN
Freq: Once | INTRAVENOUS | Status: AC
Start: 2016-12-30 — End: 2016-12-30
  Administered 2016-12-30: 15:00:00 via INTRAVENOUS

## 2016-12-30 MED ORDER — ONDANSETRON HCL 4 MG/2ML IJ SOLN
4 MG/2ML | INTRAMUSCULAR | Status: DC | PRN
Start: 2016-12-30 — End: 2017-01-01

## 2016-12-30 MED ORDER — ROCURONIUM BROMIDE 50 MG/5ML IV SOLN
50 | INTRAVENOUS | Status: AC
Start: 2016-12-30 — End: 2016-12-30

## 2016-12-30 MED ORDER — NORMAL SALINE FLUSH 0.9 % IV SOLN
0.9 % | INTRAVENOUS | Status: DC | PRN
Start: 2016-12-30 — End: 2017-01-01

## 2016-12-30 MED ORDER — ENOXAPARIN SODIUM 40 MG/0.4ML SC SOLN
40 MG/0.4ML | Freq: Every day | SUBCUTANEOUS | Status: DC
Start: 2016-12-30 — End: 2017-01-01
  Administered 2016-12-31 – 2017-01-01 (×2): 40 mg via SUBCUTANEOUS

## 2016-12-30 MED ORDER — METRONIDAZOLE IN NACL 5-0.79 MG/ML-% IV SOLN
5 mg/mL | Freq: Once | INTRAVENOUS | Status: AC
Start: 2016-12-30 — End: 2016-12-30
  Administered 2016-12-30: 16:00:00 500 mg via INTRAVENOUS

## 2016-12-30 MED ORDER — ACETAMINOPHEN 650 MG RE SUPP
650 MG | RECTAL | Status: DC | PRN
Start: 2016-12-30 — End: 2017-01-01

## 2016-12-30 MED ORDER — ACETAMINOPHEN 10 MG/ML IV SOLN
10 MG/ML | Freq: Once | INTRAVENOUS | Status: AC
Start: 2016-12-30 — End: 2016-12-30
  Administered 2016-12-30: 18:00:00 1000 mg via INTRAVENOUS

## 2016-12-30 MED ORDER — PROPOFOL 200 MG/20ML IV EMUL
200 | INTRAVENOUS | Status: AC
Start: 2016-12-30 — End: 2016-12-30

## 2016-12-30 MED ORDER — PROMETHAZINE HCL 25 MG/ML IJ SOLN
25 MG/ML | INTRAMUSCULAR | Status: AC
Start: 2016-12-30 — End: 2016-12-30
  Administered 2016-12-30: 19:00:00 6.25 via INTRAVENOUS

## 2016-12-30 MED ORDER — KCL IN DEXTROSE-NACL 20-5-0.45 MEQ/L-%-% IV SOLN
INTRAVENOUS | Status: DC
Start: 2016-12-30 — End: 2017-01-01
  Administered 2016-12-30 – 2017-01-01 (×7): via INTRAVENOUS

## 2016-12-30 MED ORDER — LIDOCAINE-EPINEPHRINE 1 %-1:100000 IJ SOLN
1 | INTRAMUSCULAR | Status: AC
Start: 2016-12-30 — End: 2016-12-30

## 2016-12-30 MED ORDER — CEFAZOLIN SODIUM-DEXTROSE 2-4 GM/100ML-% IV SOLN
2-4 GM/100ML-% | Freq: Once | INTRAVENOUS | Status: AC
Start: 2016-12-30 — End: 2016-12-30
  Administered 2016-12-30: 16:00:00 2 g via INTRAVENOUS

## 2016-12-30 MED ORDER — BUPIVACAINE LIPOSOME 1.3 % IJ SUSP
1.3 | INTRAMUSCULAR | Status: AC
Start: 2016-12-30 — End: 2016-12-30

## 2016-12-30 MED ORDER — MAGNESIUM SULFATE IN D5W 1-5 GM/100ML-% IV SOLN
1-5 | INTRAVENOUS | Status: DC | PRN
Start: 2016-12-30 — End: 2017-01-01

## 2016-12-30 MED ORDER — PROMETHAZINE HCL 25 MG/ML IJ SOLN
25 MG/ML | Freq: Once | INTRAMUSCULAR | Status: AC
Start: 2016-12-30 — End: 2016-12-30

## 2016-12-30 MED ORDER — OXYCODONE-ACETAMINOPHEN 5-325 MG PO TABS
5-325 MG | ORAL | Status: DC | PRN
Start: 2016-12-30 — End: 2017-01-01
  Administered 2016-12-31 – 2017-01-01 (×2): 1 via ORAL

## 2016-12-30 MED ORDER — LIDOCAINE HCL (CARDIAC) 20 MG/ML IV SOLN
20 | INTRAVENOUS | Status: AC
Start: 2016-12-30 — End: 2016-12-30

## 2016-12-30 MED ORDER — MIDAZOLAM HCL 2 MG/2ML IJ SOLN
2 | INTRAMUSCULAR | Status: AC
Start: 2016-12-30 — End: 2016-12-30

## 2016-12-30 MED ORDER — SUCCINYLCHOLINE CHLORIDE 20 MG/ML IJ SOLN
20 | INTRAMUSCULAR | Status: AC
Start: 2016-12-30 — End: 2016-12-30

## 2016-12-30 MED ORDER — METRONIDAZOLE IN NACL 5-0.79 MG/ML-% IV SOLN
5 mg/mL | Freq: Three times a day (TID) | INTRAVENOUS | Status: AC
Start: 2016-12-30 — End: 2016-12-31
  Administered 2016-12-30 – 2016-12-31 (×3): 500 mg via INTRAVENOUS

## 2016-12-30 MED ORDER — ACETAMINOPHEN 325 MG PO TABS
325 MG | ORAL | Status: DC | PRN
Start: 2016-12-30 — End: 2017-01-01

## 2016-12-30 MED ORDER — DOCUSATE SODIUM 100 MG PO CAPS
100 MG | Freq: Two times a day (BID) | ORAL | Status: DC
Start: 2016-12-30 — End: 2017-01-01
  Administered 2017-01-01: 15:00:00 100 mg via ORAL

## 2016-12-30 MED ORDER — MAGNESIUM HYDROXIDE 400 MG/5ML PO SUSP
400 MG/5ML | Freq: Every day | ORAL | Status: DC | PRN
Start: 2016-12-30 — End: 2017-01-01

## 2016-12-30 MED ORDER — ONDANSETRON HCL 4 MG/2ML IJ SOLN
4 | INTRAMUSCULAR | Status: AC
Start: 2016-12-30 — End: 2016-12-30

## 2016-12-30 MED ORDER — LABETALOL HCL 5 MG/ML IV SOLN
5 MG/ML | INTRAVENOUS | Status: DC | PRN
Start: 2016-12-30 — End: 2016-12-30

## 2016-12-30 MED ORDER — ESMOLOL HCL 100 MG/10ML IV SOLN
100 | INTRAVENOUS | Status: AC
Start: 2016-12-30 — End: 2016-12-30

## 2016-12-30 MED ORDER — HEPARIN SODIUM (PORCINE) 5000 UNIT/ML IJ SOLN
5000 UNIT/ML | Freq: Once | INTRAMUSCULAR | Status: AC
Start: 2016-12-30 — End: 2016-12-30
  Administered 2016-12-30: 15:00:00 5000 [IU] via SUBCUTANEOUS

## 2016-12-30 MED ORDER — MEPERIDINE HCL 25 MG/ML IJ SOLN
25 MG/ML | INTRAMUSCULAR | Status: DC | PRN
Start: 2016-12-30 — End: 2016-12-30

## 2016-12-30 MED ORDER — BISACODYL 10 MG RE SUPP
10 MG | Freq: Every day | RECTAL | Status: DC | PRN
Start: 2016-12-30 — End: 2017-01-01

## 2016-12-30 MED ORDER — BUPIVACAINE HCL (PF) 0.5 % IJ SOLN
0.5 | INTRAMUSCULAR | Status: AC
Start: 2016-12-30 — End: 2016-12-30

## 2016-12-30 MED ORDER — FENTANYL CITRATE (PF) 100 MCG/2ML IJ SOLN
100 MCG/2ML | INTRAMUSCULAR | Status: DC | PRN
Start: 2016-12-30 — End: 2016-12-30
  Administered 2016-12-30: 19:00:00 25 ug via INTRAVENOUS

## 2016-12-30 MED ORDER — NORMAL SALINE FLUSH 0.9 % IV SOLN
0.9 | INTRAVENOUS | Status: AC
Start: 2016-12-30 — End: 2016-12-30

## 2016-12-30 MED ORDER — ONDANSETRON HCL 4 MG/2ML IJ SOLN
4 MG/2ML | INTRAMUSCULAR | Status: DC
Start: 2016-12-30 — End: 2017-01-01
  Administered 2016-12-30 – 2017-01-01 (×12): 4 mg via INTRAVENOUS

## 2016-12-30 MED ORDER — NORMAL SALINE FLUSH 0.9 % IV SOLN
0.9 % | Freq: Two times a day (BID) | INTRAVENOUS | Status: DC
Start: 2016-12-30 — End: 2017-01-01
  Administered 2016-12-31 – 2017-01-01 (×2): 10 mL via INTRAVENOUS

## 2016-12-30 MED ORDER — HYDROMORPHONE HCL 2 MG/ML IJ SOLN
2 MG/ML | INTRAMUSCULAR | Status: DC | PRN
Start: 2016-12-30 — End: 2017-01-01
  Administered 2016-12-30 – 2016-12-31 (×5): 1 mg via INTRAVENOUS

## 2016-12-30 MED ORDER — SENNA-DOCUSATE SODIUM 8.6-50 MG PO TABS
Freq: Two times a day (BID) | ORAL | Status: DC
Start: 2016-12-30 — End: 2017-01-01
  Administered 2017-01-01: 15:00:00 2 via ORAL

## 2016-12-30 MED ORDER — FENTANYL CITRATE (PF) 100 MCG/2ML IJ SOLN
100 | INTRAMUSCULAR | Status: AC
Start: 2016-12-30 — End: 2016-12-30

## 2016-12-30 MED ORDER — PROMETHAZINE HCL 25 MG/ML IJ SOLN
25 MG/ML | Freq: Four times a day (QID) | INTRAMUSCULAR | Status: DC | PRN
Start: 2016-12-30 — End: 2017-01-01
  Administered 2016-12-31 – 2017-01-01 (×2): 12.5 mg via INTRAVENOUS

## 2016-12-30 MED ORDER — OXYCODONE-ACETAMINOPHEN 5-325 MG PO TABS
5-325 MG | ORAL | Status: DC | PRN
Start: 2016-12-30 — End: 2017-01-01
  Administered 2016-12-31: 23:00:00 2 via ORAL

## 2016-12-30 MED ORDER — CEFAZOLIN SODIUM 1 G IJ SOLR
1 g | Freq: Three times a day (TID) | INTRAMUSCULAR | Status: DC
Start: 2016-12-30 — End: 2016-12-30

## 2016-12-30 MED ORDER — ONDANSETRON HCL 4 MG/2ML IJ SOLN
4 MG/2ML | Freq: Once | INTRAMUSCULAR | Status: DC | PRN
Start: 2016-12-30 — End: 2016-12-30

## 2016-12-30 MED ORDER — HYDROMORPHONE HCL 2 MG/ML IJ SOLN
2 | INTRAMUSCULAR | Status: AC
Start: 2016-12-30 — End: 2016-12-30

## 2016-12-30 MED ORDER — PANTOPRAZOLE SODIUM 40 MG IV SOLR
40 MG | Freq: Two times a day (BID) | INTRAVENOUS | Status: DC
Start: 2016-12-30 — End: 2017-01-01
  Administered 2016-12-30 – 2017-01-01 (×5): 40 mg via INTRAVENOUS

## 2016-12-30 MED ORDER — SUGAMMADEX SODIUM 200 MG/2ML IV SOLN
200 | INTRAVENOUS | Status: AC
Start: 2016-12-30 — End: 2016-12-30

## 2016-12-30 MED FILL — MONOJECT FLUSH SYRINGE 0.9 % IV SOLN: 0.9 % | INTRAVENOUS | Qty: 80

## 2016-12-30 MED FILL — ONDANSETRON HCL 4 MG/2ML IJ SOLN: 4 MG/2ML | INTRAMUSCULAR | Qty: 2

## 2016-12-30 MED FILL — BUPIVACAINE HCL (PF) 0.5 % IJ SOLN: 0.5 % | INTRAMUSCULAR | Qty: 30

## 2016-12-30 MED FILL — ROCURONIUM BROMIDE 50 MG/5ML IV SOLN: 50 MG/5ML | INTRAVENOUS | Qty: 5

## 2016-12-30 MED FILL — KCL IN DEXTROSE-NACL 20-5-0.45 MEQ/L-%-% IV SOLN: INTRAVENOUS | Qty: 1000

## 2016-12-30 MED FILL — FENTANYL CITRATE (PF) 100 MCG/2ML IJ SOLN: 100 MCG/2ML | INTRAMUSCULAR | Qty: 2

## 2016-12-30 MED FILL — LACTATED RINGERS IV SOLN: INTRAVENOUS | Qty: 1000

## 2016-12-30 MED FILL — METRONIDAZOLE IN NACL 5-0.79 MG/ML-% IV SOLN: 5 mg/mL | INTRAVENOUS | Qty: 100

## 2016-12-30 MED FILL — DEXAMETHASONE SODIUM PHOSPHATE 4 MG/ML IJ SOLN: 4 MG/ML | INTRAMUSCULAR | Qty: 1

## 2016-12-30 MED FILL — ESMOLOL HCL 100 MG/10ML IV SOLN: 100 MG/10ML | INTRAVENOUS | Qty: 10

## 2016-12-30 MED FILL — PROPOFOL 200 MG/20ML IV EMUL: 200 MG/20ML | INTRAVENOUS | Qty: 20

## 2016-12-30 MED FILL — PROMETHAZINE HCL 25 MG/ML IJ SOLN: 25 MG/ML | INTRAMUSCULAR | Qty: 1

## 2016-12-30 MED FILL — MIDAZOLAM HCL 2 MG/2ML IJ SOLN: 2 MG/ML | INTRAMUSCULAR | Qty: 2

## 2016-12-30 MED FILL — HYDROMORPHONE HCL 2 MG/ML IJ SOLN: 2 MG/ML | INTRAMUSCULAR | Qty: 1

## 2016-12-30 MED FILL — CEFAZOLIN SODIUM-DEXTROSE 2-4 GM/100ML-% IV SOLN: 2-4 GM/100ML-% | INTRAVENOUS | Qty: 100

## 2016-12-30 MED FILL — OFIRMEV 10 MG/ML IV SOLN: 10 MG/ML | INTRAVENOUS | Qty: 100

## 2016-12-30 MED FILL — LIDOCAINE HCL (CARDIAC) 20 MG/ML IV SOLN: 20 MG/ML | INTRAVENOUS | Qty: 5

## 2016-12-30 MED FILL — LIDOCAINE-EPINEPHRINE 1 %-1:100000 IJ SOLN: 1 %-:00000 | INTRAMUSCULAR | Qty: 20

## 2016-12-30 MED FILL — QUELICIN 20 MG/ML IJ SOLN: 20 MG/ML | INTRAMUSCULAR | Qty: 10

## 2016-12-30 MED FILL — BRIDION 200 MG/2ML IV SOLN: 200 MG/2ML | INTRAVENOUS | Qty: 2

## 2016-12-30 MED FILL — PANTOPRAZOLE SODIUM 40 MG IV SOLR: 40 MG | INTRAVENOUS | Qty: 40

## 2016-12-30 MED FILL — HEPARIN SODIUM (PORCINE) 5000 UNIT/ML IJ SOLN: 5000 UNIT/ML | INTRAMUSCULAR | Qty: 1

## 2016-12-30 MED FILL — EXPAREL 1.3 % IJ SUSP: 1.3 % | INTRAMUSCULAR | Qty: 20

## 2016-12-30 NOTE — Other (Signed)
Patient Acct Nbr:  [id number]  Primary AUTH/CERT:  IPS/ [id number]  Primary Insurance Company Name:   [employer]  Primary Insurance Plan Name:  [facility] [place] PLUS PLAN EPO [id number]  Primary Insurance Group Number:  [id number]  Primary Insurance Plan Type: N  Primary Insurance Policy Number:  [id number]

## 2016-12-30 NOTE — Anesthesia Pre-Procedure Evaluation (Signed)
Department of Anesthesiology  Preprocedure Note       Name:  Linda Blevins   Age:  36 y.o.  DOB:  1981-09-27                                          MRN:  [id number]         Date:  12/30/2016      Surgeon:    Procedure:    Medications prior to admission:   Prior to Admission medications    Medication Sig Start Date End Date Taking? Authorizing Provider   Cholecalciferol (VITAMIN D3 PO) Take by mouth nightly Over The Counter   Yes Historical Provider, MD   Multiple Vitamins-Minerals (MULTIVITAMIN PO) Take by mouth nightly Over The Counter   Yes Historical Provider, MD   buPROPion (WELLBUTRIN XL) 300 MG extended release tablet Take 1 tablet by mouth every morning 05/06/16  Yes [name], CNP   loratadine (CLARITIN) 10 MG capsule Take 10 mg by mouth as needed Over The Counter    Historical Provider, MD       Current medications:    Current Facility-Administered Medications   Medication Dose Route Frequency Provider Last Rate Last Dose   ??? lactated ringers infusion   Intravenous Once Rikki Spearing, MD       ??? ceFAZolin (ANCEF) 2 g in dextrose 4 % 100 mL IVPB (premix)  2 g Intravenous Once Ilene Qua, MD       ??? heparin (porcine) injection 5,000 Units  5,000 Units Subcutaneous Once Ilene Qua, MD       ??? metronidazole (FLAGYL) 500 mg in NaCl 100 mL IVPB premix  500 mg Intravenous Once Ilene Qua, MD           Allergies:    Allergies   Allergen Reactions   ??? Ciprofloxacin Hives   ??? Morphine And Related Nausea And Vomiting   ??? Pcn [Penicillins] Hives   ??? Sulfa Antibiotics Hives       Problem List:    Patient Active Problem List   Diagnosis Code   ??? Morbid obesity with BMI of 45.0-49.9, adult (HCC) E66.01, Z68.42   ??? Anxiety F41.9   ??? Reactive depression F32.9   ??? Gastroesophageal reflux disease without esophagitis K21.9   ??? SUI (stress urinary incontinence, female) N39.3   ??? Snores R06.83   ??? Arthritis M19.90   ??? Primary stabbing headache G44.85   ??? Dyslipidemia E78.5       Past Medical History:         Diagnosis Date   ??? Anxiety    ??? Back pain     "Sometimes"   ??? Depression    ??? Fatigue    ??? Morbid obesity (HCC)    ??? Sleep apnea     Uses CPAP   ??? Teeth missing     Upper And Lower   ??? Wears contact lenses     Bilateral    ??? Wears glasses    ??? Wisdom teeth extracted     4 Wisdom Teeth Extracted In Past       Past Surgical History:        Procedure Laterality Date   ??? DENTAL SURGERY      Teeth Extracted In Past   ??? ENDOSCOPY, COLON, DIAGNOSTIC  11/2016   ??? HYSTERECTOMY VAGINAL  2012   ???  LAPAROSCOPY  2011    "Twice"   ??? TONSILLECTOMY AND ADENOIDECTOMY  2014   ??? WISDOM TOOTH EXTRACTION      4 Wisdom Teeth Extracted In Past       Social History:    Social History   Substance Use Topics   ??? Smoking status: Never Smoker   ??? Smokeless tobacco: Never Used   ??? Alcohol use No                                Counseling given: Not Answered      Vital Signs (Current):   Vitals:    12/30/16 0858   BP: 121/64   Pulse: 71   Resp: 16   Temp: 97.1 ??F (36.2 ??C)   TempSrc: Temporal   SpO2: 96%   Weight: 238 lb (108 kg)   Height: 5\' 2"  (1.575 m)                                              BP Readings from Last 3 Encounters:   12/30/16 121/64   12/28/16 120/74   12/21/16 120/70       NPO Status: Time of last liquid consumption: 2300                        Time of last solid consumption: 2100                        Date of last liquid consumption: 12/29/16                        Date of last solid food consumption: 12/29/16    BMI:   Wt Readings from Last 3 Encounters:   12/30/16 238 lb (108 kg)   12/28/16 239 lb 11.2 oz (108.7 kg)   12/21/16 246 lb 2 oz (111.6 kg)     Body mass index is 43.53 kg/m??.    Anesthesia Evaluation    Airway: Mallampati: III        Dental: normal exam         Pulmonary:   (+) sleep apnea:                             Cardiovascular:             Beta Blocker:  Not on Beta Blocker         Neuro/Psych:               GI/Hepatic/Renal:   (+) GERD:, morbid obesity          Endo/Other:                     Abdominal:    (+) obese,         Vascular:                                        Anesthesia Plan      general     ASA 3       Induction: intravenous.      Anesthetic plan  and risks discussed with patient.    Use of blood products discussed with patient whom.   Plan discussed with CRNA.                  Mickel Duhamel, DO   12/30/2016

## 2016-12-30 NOTE — Consults (Signed)
HOSPITALIST NON-PHYSICIAN PROVIDER CONSULTATION NOTE                      Name:  Linda Blevins DOB/Age/Sex: [birth year]  (36 y.o. female)   MRN & CSN:  [id number] & [id number] Admission Date/Time: 12/30/2016  8:43 AM   Location:  4021/4021-A Attending:  Ilene Qua, MD                                                  REASON FOR CONSULTATION:  Medical Management of Morbid obese and depression    HPI  Linda Blevins is a 36 y.o. female who presents to with chief complaint of nausea and abdominal pain s/p gastric sleeve. Pt reports 7/10 sharp pain worse with movement. She had Tylenol IV with some relief. Additional order for Dilaudid and PCT in place. Denies fever, chills, or diarrhea. Hx of morbid obese and depression.     SUBJECTIVE    10 point review of systems reviewed and negative unless noted above.    ALLERGIES PCP    Allergies   Allergen Reactions   ??? Ciprofloxacin Hives   ??? Morphine And Related Nausea And Vomiting   ??? Pcn [Penicillins] Hives   ??? Sulfa Antibiotics Hives    [name], CNP   PAST MEDICAL HISTORY SURGICAL HISTORY   Past Medical History:   Diagnosis Date   ??? Anxiety    ??? Back pain     "Sometimes"   ??? Depression    ??? Fatigue    ??? Morbid obesity (HCC)    ??? Sleep apnea     Uses CPAP   ??? Teeth missing     Upper And Lower   ??? Wears contact lenses     Bilateral    ??? Wears glasses    ??? Wisdom teeth extracted     4 Wisdom Teeth Extracted In Past    Past Surgical History:   Procedure Laterality Date   ??? DENTAL SURGERY      Teeth Extracted In Past   ??? ENDOSCOPY, COLON, DIAGNOSTIC  11/2016   ??? HYSTERECTOMY VAGINAL  2012   ??? LAPAROSCOPY  2011    "Twice"   ??? SLEEVE GASTRECTOMY  12/30/2016    Robotic   ??? TONSILLECTOMY AND ADENOIDECTOMY  2014   ??? WISDOM TOOTH EXTRACTION      4 Wisdom Teeth Extracted In Past      SOCIAL HISTORY FAMILY HISTORY   Social History     Social History   ??? Marital status: Married     Spouse name: N/A   ??? Number of children: N/A   ??? 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 Smoking status: Never Smoker   ??? Smokeless tobacco: Never Used   ??? Alcohol use No   ??? Drug use: No   ??? Sexual activity: Yes     Partners: Male     Other Topics Concern   ??? None     Social History Narrative   ??? None    Family History   Problem Relation Age of Onset   ??? Asthma Mother    ??? Depression Mother    ??? High Blood Pressure Mother    ???  High Blood Pressure Father    ??? High Cholesterol Father        HOME MEDICATIONS    Prior to Admission medications    Medication Sig Start Date End Date Taking? Authorizing Provider   Cholecalciferol (VITAMIN D3 PO) Take by mouth nightly Over The Counter   Yes Historical Provider, MD   Multiple Vitamins-Minerals (MULTIVITAMIN PO) Take by mouth nightly Over The Counter   Yes Historical Provider, MD   buPROPion (WELLBUTRIN XL) 300 MG extended release tablet Take 1 tablet by mouth every morning 05/06/16  Yes Gala MurdochSue A Carter, CNP   loratadine (CLARITIN) 10 MG capsule Take 10 mg by mouth as needed Over The Counter    Historical Provider, MD          OBJECTIVE  Vitals:    12/30/16 1517   BP: 130/81   Pulse: 73   Resp: 20   Temp: 97.7 ??F (36.5 ??C)   SpO2: 100%       Intake/Output Summary (Last 24 hours) at 12/30/16 1536  Last data filed at 12/30/16 1411   Gross per 24 hour   Intake             1100 ml   Output               10 ml   Net             1090 ml       Intake/Output Summary (Last 24 hours) at 12/30/16 1536  Last data filed at 12/30/16 1411   Gross per 24 hour   Intake             1100 ml   Output               10 ml   Net             1090 ml             No intake/output data recorded.    PHYSICAL EXAM   Gen:  awake, alert, cooperative, moderate distress d/t pain  EYES:Lids and lashes normal, pupils equal, round ,extra ocular muscles intact, sclera clear, conjunctiva normal  ENT:  Normocephalic, oral pharynx with moist mucus membranes  NECK:  Supple, symmetrical, trachea midline, no adenopathy,  LUNGS:  Diminished  bilaterally, no rales ronchi or wheezing noted.  CARDIOVASCULAR:  regular rate and rhythm, normal S1 and S2,no murmur noted, peripheral pulses 2+, no pitting edema  ABDOMEN: Hypoactive , Tenderness throughout the abdomen, non distended, no HSM noted. Obese. 4 wound stabs covered with dermabond.   MUSCULOSKELETAL:  ROM of all extremities grossly wnl  NEUROLOGIC: AOx 3,  Cranial nerves II-XII are grossly intact.  Motor is 5 out of 5 bilaterally.  Sensory is intact, no lateralizing findings.   SKIN:  no bruising or bleeding, normal skin color, turgor, no redness, warmth, or swelling      LABS  No results found for this visit on 12/30/16.     IMAGING      MEDS  Scheduled Meds:  ??? [START ON 12/31/2016] buPROPion  300 mg Oral QAM   ??? sodium chloride flush  10 mL Intravenous 2 times per day   ??? docusate sodium  100 mg Oral BID   ??? [START ON 12/31/2016] enoxaparin  40 mg Subcutaneous Daily   ??? metroNIDAZOLE  500 mg Intravenous Q8H   ??? pantoprazole  40 mg Intravenous BID   ??? sennosides-docusate sodium  2 tablet Oral BID   ???  ondansetron  4 mg Intravenous Q4H   ??? ceFAZolin  2 g Intravenous Q8H     Continuous Infusions:  ??? dextrose 5% and 0.45% NaCl with KCl 20 mEq 125 mL/hr at 12/30/16 1357     PRN Meds:sodium chloride flush, potassium chloride, magnesium sulfate, acetaminophen, acetaminophen, oxyCODONE-acetaminophen **OR** oxyCODONE-acetaminophen, HYDROmorphone, magnesium hydroxide, bisacodyl, ondansetron, promethazine    ASSESSMENT and PLAN  Hospital Day: 1 Morbid obese s/p Lap robotic DaVinci assisted gastric sleeve.  BMI 43.6  -Antiemetic and pain management  -ABX empirically  -Dr. Ward Givens onboard    2. Depression per hx, continue Wellbutrin        Diet Diet NPO Effective Now Exceptions are: Ice Chips, Sips with Meds   DVT Prophylaxis [x]  Lovenox, []   Heparin, []  SCDs, []  Ambulation   GI Prophylaxis [x]  PPI,  []  H2 Blocker,  []  Carafate,  []  Diet/Tube Feeds   Code Status Full Code   Disposition Patient requires continued  admission due to IV ABX and IVF   CMS Level of Risk []  Low, [x]  Moderate,[]   High  Patient's risk as above due to post op monitoring       Thank you Dr. Ward Givens for allowing [place] to participate in the care of your patient. Further recommendations will be made as the patient's hospital course progresses. If you have any questions about the medical care of this patient, please do not hesitate to contact us.    Electronically signed by Adria Devon, CNP, on 12/30/2016 at 3:36 PM

## 2016-12-30 NOTE — Care Coordination-Inpatient (Signed)
Reviewed chart and spoke with RN about discharge needs/plans.  Pt lives with her husband and 34 yr old daughter.  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  Family will assist her as needed with ADL's and transportation.  Pt has PCP and insurance that covers medications.  Pt denies any needs at this time.

## 2016-12-30 NOTE — Anesthesia Post-Procedure Evaluation (Signed)
Anesthesia Post-op Note    Patient: Linda DownsJessica S Liotta  MRN: [id number]  Birthdate: 08-05-1981  Date of evaluation: 12/30/2016  Time:  2:00 PM     Procedure(s) Performed:     Last Vitals: BP 128/73    Pulse 78    Temp 97.7 ??F (36.5 ??C) (Temporal)    Resp 14    Ht 5\' 2"  (1.575 m)    Wt 238 lb (108 kg)    SpO2 100%    BMI 43.53 kg/m??     Aldrete Phase I: Aldrete Score: 4    Aldrete Phase II:      Anesthesia Post Evaluation    Final anesthesia type: general  Patient location during evaluation: PACU  Patient participation: complete - patient participated  Level of consciousness: awake  Airway patency: patent  Nausea & Vomiting: no nausea  Complications: no  Cardiovascular status: hemodynamically stable  Respiratory status: acceptable        Alvie Fowles M Kealii Thueson, DO  2:00 PM

## 2016-12-30 NOTE — Progress Notes (Signed)
1302: Arrived to PACU from OR. Monitors applied, alarms on. [facility] and M. ArchitectHall CRNA. Airway obstructing, #30 Reusch nasal airway inserted right nares without difficulty. Good air exchange noted.  1310: Arouses briefly to name, takes a deep breath when asked then quickly goes back to sleep.  1335: Nasal airway removed.  1345: Medicated for nausea and abdominal discomfort.  1420: Transported to room 4021. Care assumed per MicrosoftCrystal RN. Family at bedside.

## 2016-12-30 NOTE — Op Note (Signed)
OPERATIVE / PROCEDURE NOTE    Linda Blevins,Makayle S, 09-03-1981, 36 y.o.,  female, CSN: [id number]  December 30, 2016       PREOPERATIVE DIAGNOSES:    Past Medical History:   Diagnosis Date   ??? Anxiety    ??? Back pain     "Sometimes"   ??? Depression    ??? Fatigue    ??? Morbid obesity (HCC)    ??? Sleep apnea     Uses CPAP   ??? Teeth missing     Upper And Lower   ??? Wears contact lenses     Bilateral    ??? Wears glasses    ??? Wisdom teeth extracted     4 Wisdom Teeth Extracted In Past        POSTOPERATIVE DIAGNOSES:   The same.     PROCEDURE: Laparoscopic robotic DaVinci-assisted sleeve gastrectomy around a  38-French bougie.    Surgeon: Estill BakesMagued Shakera Ebrahimi, MD, FACS, FICS.    ANESTHESIA: General endotracheal anesthesia as well as local to the wounds  preoperatively using 1% Xylocaine with epinephrine and  Exparel post operatively.     ESTIMATED BLOOD LOSS: Less than 10 mL.     SPECIMEN: Partial gastrectomy.    DRAINS: None.     COMPLICATIONS: None.     CONDITION: Stable and extubated to the PACU.     OPERATIVE DESCRIPTION: After proper informed consent was signed by the  patient knowing all the risks, benefits, potential complications, and  possible alternatives of the procedure after she underwent very extensive  preoperative workup and evaluation and optimization after having seen the  dietician and followed with a 1200-1500-calorie restriction diet, the physical  therapist, and complied adequately with 3 times a week PT  exercises in addition to his routine daily activities and has been seen by  the psychologist, and after she underwent cardiac  clearance and pulmonology clearance and after she   adequately complied with dietary and lifestyle modification changes, the  patient was well prepped and committed to her lifestyle modifying and changing bariatric procedure in an  attempt to improve and/or eliminate some of her comorbidities. Preoperatively she was placed on a  liquid high-protein, low-carbohydrate Slim-Fast diet for  the  last 2 weeks, and successfully lost 14 Lbs.  In the holding area, she   received 3 mg of Ancef IV, 750 mg of Flagyl IV. TEDs  and SCDs were placed on her legs and activated bilaterally. Hibiclens skin  prep to the abdomen was performed. Then she was taken to the operating room and  was placed supine on the operating room table after institution of general  endotracheal anesthesia.  The above-mentioned anesthetic 1% Xylocaine with epinephrine  was used to anesthetize all the incisions:  at the left lateral rectus level a 12-mm incision was performed. The Visiport was used to enter  the abdomen under direct visualization. Pneumoperitoneum was instituted with  C02 insufflation up to 15 mmHg pressure. The patient was then placed in steep reverse Trendelenburg   position, and the left subxiphoid 4-mm incision  was performed through which the [facility]. The  left lobe of the liver was retracted superiorly / cephalad and to the right  of the patient to expose the hiatus and hiatal hernia adequately. The  Martin table-mounted arm was stabilized. All of  the ports were placed, an 8-mm left subcostal port in the left anterior  axillary line, mirrored image right subcostal port in the right anterior  axillary line, and a 12-mm right midclavicular port for the Ethicon stapler; all on a semilunar line.  After all the  ports were placed in, the DaVinci robot was docked in after exploration   of the abdomen revealed no  contraindication to proceed with the planned procedures.   The hiatal hernia was about 4 cm in greatest dimension. The pylorus was identified,   and 4 cm proximal to the pylorus, the gastrocolic omentum was taken down meticulously  using the robotic vessel sealer. Part of the right gastroepiploic vessels, the  left gastroepiploic and the short gastric vessels were taken down  meticulously using the DaVinci vessel sealer all the way to the angle of Hiss which  was done uneventfully  without any bleeding encountered throughout the whole  procedure. Some posterior gastro-pancreatic attachments were identified and  divided sharply uneventfully, and then the OG tube placed to suction at the beginning  of the procedure until this point of the surgery was then removed  uneventfully and a 38-French bougie was advanced per Anesthesia and guided  robotically all the way until it crossed the pylorus.     The Ethicon Endo-GIA 60-mm stapler  was then used to create the sleeve gastrectomy, by triple stapling on each side and dividing the stomach  starting 4 cm proximal to the pylorus, cephalad; starting with 1 green  loads followed by 1 gold refill followed by 3 blue staplers for a total of 5  firings all the way to the angle of Hiss around the 38-French bougie  uneventfully in a very even manner anteriorly and posteriorly, without any bleeding encountered  throughout the whole procedure, without any staple misfires, totally uneventfully. The suture line was then inverted and  oversewn and buttressed completely all the way starting at the angle of Hiss, distally  all the way to the pre-pyloric area using 3-0 Vicryl in a running continuous  fashion. One continuous suture was used. The staple line was then inspected  Meticulously and no abnormality was noted. No bleeding noted either. The  oversewn staple line was then sprayed with Tisseel -tissue sealant for an extra layer of  Protection against bleeding & / or leaks.    The excluded stomach was then delivered through the left  supraumbilical port wound and was palpated for any gross abnormalities, and none  were noted. It was sent to permanent pathology. The Carter-Thomason fascia  closure device was then used to reapproximate the 12-mm ports and the 10-mm port with 0  Vicryl. All ports were removed under direct visualization without any  evidence of port site bleeding, including the Nathanson liver retractor after the  pneumoperitoneum was evacuated.  Further more all port sites were re-injected with the above mentioned local anesthetic mixture.   The skin of all wounds were approximated using 4-0  Vicryl in a running subcuticular fashion followed by 2 layers of Dermabond skin glue.     The patient tolerated both procedures very well without any complications, was extubated in the OR, and was  sent to the PACU in stable condition.    ____________________________________________    Estill Bakes, MD, FACS, FICS  12/30/2016  1:24 PM

## 2016-12-31 ENCOUNTER — Encounter: Primary: Registered Nurse

## 2016-12-31 ENCOUNTER — Inpatient Hospital Stay: Primary: Registered Nurse

## 2016-12-31 ENCOUNTER — Inpatient Hospital Stay: Admit: 2016-12-31 | Primary: Registered Nurse

## 2016-12-31 LAB — CBC WITH AUTO DIFFERENTIAL
Basophils %: 0.1 % (ref 0–1)
Basophils Absolute: 0 10*3/uL
Eosinophils %: 0 % (ref 0–3)
Eosinophils Absolute: 0 10*3/uL
Hematocrit: 40 % (ref 37–47)
Hemoglobin: 13.5 GM/DL (ref 12.5–16.0)
Immature Neutrophil %: 0.4 % (ref 0–0.43)
Lymphocytes %: 6.2 % — ABNORMAL LOW (ref 24–44)
Lymphocytes Absolute: 0.8 10*3/uL
MCH: 31 PG (ref 27–31)
MCHC: 33.8 % (ref 32.0–36.0)
MCV: 91.7 FL (ref 78–100)
MPV: 10.7 FL (ref 7.5–11.1)
Monocytes %: 6.5 % — ABNORMAL HIGH (ref 0–4)
Monocytes Absolute: 0.8 10*3/uL
Nucleated RBC %: 0 %
Platelets: 256 10*3/uL (ref 140–440)
RBC: 4.36 10*6/uL (ref 4.2–5.4)
RDW: 11.5 % — ABNORMAL LOW (ref 11.7–14.9)
Segs Absolute: 10.6 10*3/uL
Segs Relative: 86.8 % — ABNORMAL HIGH (ref 36–66)
Total Immature Neutrophil: 0.05 10*3/uL
Total Nucleated RBC: 0 10*3/uL
WBC: 12.2 10*3/uL — ABNORMAL HIGH (ref 4.0–10.5)

## 2016-12-31 LAB — BASIC METABOLIC PANEL W/ REFLEX TO MG FOR LOW K
Anion Gap: 11 (ref 4–16)
BUN: 7 MG/DL (ref 6–23)
CO2: 26 MMOL/L (ref 21–32)
Calcium: 8.1 MG/DL — ABNORMAL LOW (ref 8.3–10.6)
Chloride: 100 mMol/L (ref 99–110)
Creatinine: 0.7 MG/DL (ref 0.6–1.1)
GFR African American: >60 mL/min/{1.73_m2} (ref >60)
GFR Non-African American: >60 mL/min/{1.73_m2} (ref >60)
Glucose: 165 MG/DL — ABNORMAL HIGH (ref 70–99)
Potassium reflex Magnesium: 4.3 MMOL/L (ref 3.5–5.1)
Sodium: 137 MMOL/L (ref 135–145)

## 2016-12-31 MED ORDER — SENNOSIDES-DOCUSATE SODIUM 8.6-50 MG PO TABS
ORAL_TABLET | Freq: Every day | ORAL | 3 refills | Status: AC
Start: 2016-12-31 — End: 2017-01-10

## 2016-12-31 MED ORDER — OXYCODONE-ACETAMINOPHEN 5-325 MG PO TABS
5-325 MG | ORAL_TABLET | Freq: Four times a day (QID) | ORAL | 0 refills | Status: AC | PRN
Start: 2016-12-31 — End: 2017-01-07

## 2016-12-31 MED ORDER — PANTOPRAZOLE SODIUM 40 MG PO TBEC
40 MG | ORAL_TABLET | Freq: Two times a day (BID) | ORAL | 3 refills | Status: DC
Start: 2016-12-31 — End: 2018-05-30

## 2016-12-31 MED ORDER — ONDANSETRON 4 MG PO TBDP
4 MG | ORAL_TABLET | ORAL | 3 refills | Status: AC | PRN
Start: 2016-12-31 — End: ?

## 2016-12-31 MED ORDER — DIATRIZOATE MEGLUMINE & SODIUM 66-10 % PO SOLN
66-10 % | Freq: Once | ORAL | Status: DC | PRN
Start: 2016-12-31 — End: 2017-01-01
  Administered 2016-12-31: 15:00:00 30 mL via ORAL

## 2016-12-31 MED ORDER — DIATRIZOATE MEGLUMINE & SODIUM 66-10 % PO SOLN
66-10 | ORAL | Status: AC
Start: 2016-12-31 — End: 2016-12-31

## 2016-12-31 MED FILL — KCL IN DEXTROSE-NACL 20-5-0.45 MEQ/L-%-% IV SOLN: INTRAVENOUS | Qty: 1000

## 2016-12-31 MED FILL — METRONIDAZOLE IN NACL 5-0.79 MG/ML-% IV SOLN: 5 mg/mL | INTRAVENOUS | Qty: 100

## 2016-12-31 MED FILL — ONDANSETRON HCL 4 MG/2ML IJ SOLN: 4 MG/2ML | INTRAMUSCULAR | Qty: 2

## 2016-12-31 MED FILL — MD-GASTROVIEW 66-10 % PO SOLN: 66-10 % | ORAL | Qty: 30

## 2016-12-31 MED FILL — PANTOPRAZOLE SODIUM 40 MG IV SOLR: 40 MG | INTRAVENOUS | Qty: 40

## 2016-12-31 MED FILL — OXYCODONE-ACETAMINOPHEN 5-325 MG PO TABS: 5-325 MG | ORAL | Qty: 2

## 2016-12-31 MED FILL — PROMETHAZINE HCL 25 MG/ML IJ SOLN: 25 MG/ML | INTRAMUSCULAR | Qty: 1

## 2016-12-31 MED FILL — HYDROMORPHONE HCL 2 MG/ML IJ SOLN: 2 MG/ML | INTRAMUSCULAR | Qty: 1

## 2016-12-31 MED FILL — OXYCODONE-ACETAMINOPHEN 5-325 MG PO TABS: 5-325 MG | ORAL | Qty: 1

## 2016-12-31 MED FILL — CEFAZOLIN SODIUM-DEXTROSE 2-4 GM/100ML-% IV SOLN: 2-4 GM/100ML-% | INTRAVENOUS | Qty: 100

## 2016-12-31 MED FILL — LOVENOX 40 MG/0.4ML SC SOLN: 40 MG/0.4ML | SUBCUTANEOUS | Qty: 0.4

## 2016-12-31 NOTE — Discharge Summary (Addendum)
Physician Discharge Summary     Patient ID:  Linda Blevins  [id number]  35 y.o.  [birth year]    Admit date: 12/30/2016    Discharge date: 01/01/2017     Admitting Physician: Ilene Qua, MD     Admission Diagnoses: Morbid obesity with BMI of 40.0-44.9, adult (HCC) [E66.01, Z68.41]  Patient Active Problem List   Diagnosis   . Morbid obesity with BMI of 45.0-49.9, adult (HCC)   . Anxiety   . Reactive depression   . Gastroesophageal reflux disease without esophagitis   . SUI (stress urinary incontinence, female)   . Snores   . Arthritis   . Primary stabbing headache   . Dyslipidemia   . Morbid obesity with BMI of 40.0-44.9, adult [facility])     Past Medical History:   Diagnosis Date   . Anxiety    . Back pain     "Sometimes"   . Depression    . Fatigue    . Morbid obesity (HCC)    . Sleep apnea     Uses CPAP   . Teeth missing     Upper And Lower   . Wears contact lenses     Bilateral    . Wears glasses    . Wisdom teeth extracted     4 Wisdom Teeth Extracted In Past       Discharge Diagnoses: same    Admission Condition: Fair.    Discharged Condition: Good.    Indication for Admission: Elective bariatric surgery.    Hospital Course: Patient had an uneventful hospital course after her bariatric procedure that went uneventfully: laparoscopic robotic assisted Sleeve gastrectomy, and was tolerating bariatric stage II diet and ambulating without difficulty at the time of discharge.    Consults: Hospitalist / PCP.    Treatments: IV hydration, antibiotics: Ancef and metronidazole, analgesia: acetaminophen w/ codeine and Dilaudid, anticoagulation: LMW heparin, respiratory therapy: O2 and incentive spirometry and surgery: laparoscopic robotic assisted Sleeve gastrectomy.    Discharge Exam:  BP 139/84   Pulse 70   Temp 99.4 F (37.4 C) (Oral)   Resp 17   Ht 5\' 2"  (1.575 m)   Wt 247 lb (112 kg)   SpO2 95%   BMI 45.18 kg/m     General Appearance:    Alert, cooperative, no distress,  appears stated age   Head:    Normocephalic, without obvious abnormality, atraumatic   Eyes:    PERRL, conjunctiva/corneas clear, EOM's intact, fundi     benign, both eyes   Ears:    Normal TM's and external ear canals, both ears   Nose:   Nares normal, septum midline, mucosa normal, no drainage    or sinus tenderness   Throat:   Lips, mucosa, and tongue normal; teeth and gums normal   Neck:   Supple, symmetrical, trachea midline, no adenopathy;     thyroid:  no enlargement/tenderness/nodules; no carotid    bruit or JVD   Back:     Symmetric, no curvature, ROM normal, no CVA tenderness   Lungs:     Clear to auscultation bilaterally, respirations unlabored   Chest Wall:    No tenderness or deformity    Heart:    Regular rate and rhythm, S1 and S2 normal, no murmur, rub   or gallop   Breast Exam:    No tenderness, masses, or nipple abnormality   Abdomen:     Soft, non-tender, bowel sounds active all four quadrants,  no masses, no organomegaly   Genitalia:    Normal female without lesion, discharge or tenderness   Rectal:    Normal tone ;guaiac negative stool   Extremities:   Extremities normal, atraumatic, no cyanosis or edema   Pulses:   2+ and symmetric all extremities   Skin:   Skin color, texture, turgor normal, no rashes or lesions   Lymph nodes:   Cervical, supraclavicular, and axillary nodes normal   Neurologic:   CNII-XII intact, normal strength, sensation and reflexes     throughout         Discharge vitals:   Wt Readings from Last 3 Encounters:   01/01/17 247 lb (112 kg)   12/28/16 239 lb 11.2 oz (108.7 kg)   12/21/16 246 lb 2 oz (111.6 kg)   ,  Temp Readings from Last 3 Encounters:   01/01/17 99.4 F (37.4 C) (Oral)   12/21/16 98.4 F (36.9 C) (Temporal)   12/02/16 97.2 F (36.2 C)   ,  BP Readings from Last 3 Encounters:   01/01/17 139/84   12/28/16 120/74   12/21/16 120/70   ,  Pulse Readings from Last 3 Encounters:   01/01/17 70   12/28/16 85   12/21/16 76        Disposition: home.    Patient  Instructions:   Current Discharge Medication List      START taking these medications    Details   oxyCODONE-acetaminophen (PERCOCET) 5-325 MG per tablet Take 1-2 tablets by mouth every 6 hours as needed for Pain for up to 7 days.  Qty: 25 tablet, Refills: 0    Associated Diagnoses: Morbid obesity with BMI of 45.0-49.9, adult (HCC)      pantoprazole (PROTONIX) 40 MG tablet Take 1 tablet by mouth 2 times daily  Qty: 60 tablet, Refills: 3    Associated Diagnoses: Morbid obesity with BMI of 45.0-49.9, adult (HCC)      senna-docusate (SENOKOT S) 8.6-50 MG per tablet Take 2 tablets by mouth daily for 10 days  Qty: 60 tablet, Refills: 3    Associated Diagnoses: Morbid obesity with BMI of 45.0-49.9, adult (HCC)      ondansetron (ZOFRAN ODT) 4 MG disintegrating tablet Take 1 tablet by mouth every 4 hours as needed for Nausea or Vomiting  Qty: 30 tablet, Refills: 3    Associated Diagnoses: Morbid obesity with BMI of 45.0-49.9, adult (HCC)         CONTINUE these medications which have NOT CHANGED    Details   buPROPion (WELLBUTRIN XL) 300 MG extended release tablet Take 1 tablet by mouth every morning  Qty: 30 tablet, Refills: 3         STOP taking these medications       Cholecalciferol (VITAMIN D3 PO) Comments:   Reason for Stopping:         Multiple Vitamins-Minerals (MULTIVITAMIN PO) Comments:   Reason for Stopping:         loratadine (CLARITIN) 10 MG capsule Comments:   Reason for Stopping:             Activity: no lifting > 20 Lbs, or Strenuous exercise for 3 weeks.  Diet: encourage fluids and bariatric stage II diet for 2 wks.  Wound Care: keep wound clean and dry    Call  [phone] to make a follow-up appointment  with Dr Estill Bakes, MD, FACS, FICS in 1 week.    ____________________________________________    Signed:  Estill Bakes, MD, FACS, FICS    01/01/2017  8:07 AM

## 2016-12-31 NOTE — Discharge Instructions (Signed)
HOME CARE INSTRUCTIONS FOLLOWING SURGERY           Diet:   Slowly advance diet to stage II FULL LIQUIDS BARIATRIC diet as tolerated x 2 wks.   Increase your fluids, as pain medications may cause constipation.   No alcoholic beverages.    Activities:   No strenuous activity   No lifting greater than 20 pounds   No driving while taking pain medication    Wound Care:   No tub baths, swimming, or hot tubs for 6 wks.   May shower 24 hours after surgery.   Ice to incision the first 24 hrs as needed for pain; after that may use dry warm compresses as needed.   Allow Dermaond (the skin glue) to fall off on its own.    Pain control:  Percocet 5/325 mg: 1-2 tablets by mouth every 4-6 hours as needed for pain.    Antacid:  Protonix as prescribed twice daily for 3 months.    Stool softener:  Senna 8.6 mg with Docusate Sodium 50 mg (Senokot-S): Two Capsules by mouth every day while on Percocet (or any narcotics) to prevent constipation. (May take 2-twice a day if needed).    Laxative:  Milk of magnesia 30-60 ml by mouth every day as needed for constipation.    Please call the office at: [phone]  to make a follow-up with Dr Estill BakesMagued Malakye Nolden, MD, FACS, FICS in 1-2 week.    Call Surgeon if you have:   Temperature greater than 101.   Persistent nausea and vomiting.   Severe uncontrolled pain.   Redness, tenderness, or signs of infection (pain, swelling, redness, odor or green/yellow discharge around the site).   Difficulty breathing, headache or visual disturbances.   Hives.   Persistent dizziness or light-headedness.   Extreme fatigue.   Any other questions or concerns you may have after discharge.    In an emergency, call 911 or go to an Emergency Department.      It is important to bring a complete, current list of your medications to any medical appointments or hospitalizations.      You just successfully completed a VERY important step on the way to living a healthy, full life; Continue the  great work that you started.      GET WELL SOON!  May God Bless you.    ____________________________________________    Signed:    Estill BakesMagued Catalea Labrecque, MD, FACS, FICS    12/31/2016  7:04 AM

## 2016-12-31 NOTE — Plan of Care (Signed)
Problem: SAFETY  Goal: Free from accidental physical injury  Outcome: Ongoing    Goal: Free from intentional harm  Outcome: Ongoing      Problem: DAILY CARE  Goal: Daily care needs are met  Outcome: Ongoing      Problem: PAIN  Goal: Patient's pain/discomfort is manageable  Outcome: Ongoing      Problem: SKIN INTEGRITY  Goal: Skin integrity is maintained or improved  Outcome: Ongoing

## 2016-12-31 NOTE — Progress Notes (Signed)
BARIATRIC SURGERY PROGRESS NOTE                Vitals:    12/30/16 1517 12/30/16 1831 12/30/16 2324 12/31/16 0336   BP: 130/81 115/63 (!) 104/59 (!) 107/55   Pulse: 73 80 73 78   Resp: 20 16 18 12    Temp: 97.7 F (36.5 C) 97.7 F (36.5 C) 98 F (36.7 C) 97.5 F (36.4 C)   TempSrc:   Oral Oral   SpO2: 100% 97% 93% 90%   Weight:       Height:         I/O last 3 completed shifts:  In: 1920.8 [I.V.:1720.8; IV Piggyback:200]  Out: 10 [Blood:10]  No intake/output data recorded.    Diet NPO Effective Now Exceptions are: Ice Chips, Sips with Meds    Recent Results (from the past 48 hour(s))   Basic Metabolic Panel w/ Reflex to MG    Collection Time: 12/31/16  3:19 AM   Result Value Ref Range    Sodium 137 135 - 145 MMOL/L    Potassium reflex Magnesium 4.3 3.5 - 5.1 MMOL/L    Chloride 100 99 - 110 mMol/L    CO2 26 21 - 32 MMOL/L    Anion Gap 11 4 - 16    BUN 7 6 - 23 MG/DL    CREATININE 0.7 0.6 - 1.1 MG/DL    Glucose 956 (H) 70 - 99 MG/DL    Calcium 8.1 (L) 8.3 - 10.6 MG/DL    GFR Non-African American >60 >60 mL/min/1.25m2    GFR African American >60 >60 mL/min/1.75m2   CBC auto differential    Collection Time: 12/31/16  3:19 AM   Result Value Ref Range    WBC 12.2 (H) 4.0 - 10.5 K/CU MM    RBC 4.36 4.2 - 5.4 M/CU MM    Hemoglobin 13.5 12.5 - 16.0 GM/DL    Hematocrit 38.7 37 - 47 %    MCV 91.7 78 - 100 FL    MCH 31.0 27 - 31 PG    MCHC 33.8 32.0 - 36.0 %    RDW 11.5 (L) 11.7 - 14.9 %    Platelets 256 140 - 440 K/CU MM    MPV 10.7 7.5 - 11.1 FL    Differential Type AUTOMATED DIFFERENTIAL     Segs Relative 86.8 (H) 36 - 66 %    Lymphocytes % 6.2 (L) 24 - 44 %    Monocytes % 6.5 (H) 0 - 4 %    Eosinophils % 0.0 0 - 3 %    Basophils % 0.1 0 - 1 %    Segs Absolute 10.6 K/CU MM    Lymphocytes # 0.8 K/CU MM    Monocytes # 0.8 K/CU MM    Eosinophils # 0.0 K/CU MM    Basophils # 0.0 K/CU MM    Nucleated RBC % 0.0 %    Total Nucleated RBC 0.0 K/CU MM    Total Immature Neutrophil 0.05 K/CU MM    Immature Neutrophil % 0.4 0  - 0.43 %       Scheduled Meds:  . buPROPion  300 mg Oral QAM   . sodium chloride flush  10 mL Intravenous 2 times per day   . docusate sodium  100 mg Oral BID   . enoxaparin  40 mg Subcutaneous Daily   . metroNIDAZOLE  500 mg Intravenous Q8H   . pantoprazole  40 mg Intravenous BID   .  sennosides-docusate sodium  2 tablet Oral BID   . ondansetron  4 mg Intravenous Q4H   . ceFAZolin  2 g Intravenous Q8H     Continuous Infusions:  . dextrose 5% and 0.45% NaCl with KCl 20 mEq 125 mL/hr at 12/31/16 0541       Physical Exam:  HEENT: Anicteric sclerae, Oropharyngeal mucosae moist, pink and intact.  Heart:  Normal S1 and S2, RRR  Lungs: Clear to auscultation bilaterally, No audible Wheezes or Rales.  Extremities: No edema.  Neuro: Alert and Oriented x 3, Non focal.  Abdomen: Soft, Benign, Non tender, Non distended, Positive bowel sounds.  Incision: Nicely healing: No erythema, No discharge.      Active Problems:    Morbid obesity with BMI of 40.0-44.9, adult (HCC)  Resolved Problems:    * No resolved hospital problems. *      Assessment and Plan:  Linda Blevins is a 36 y.o. female who is POD # 1 status post Laparoscopic robotic DaVinci-assisted sleeve gastrectomy around a  38-French bougie.  Pain and nausea under adequate control.    Today pending gastrografin upper GI to r/o stricture, r/o leaks.    START:    Bariatric stage I: Clears  then advance to stage II Full liquid diet: no sugar added    UP TO 4 Oz every 15 min, and over 15 min each.    Increase Ambulation to at least 4x/day walk in the hallways with assistance.  Respiratory peri-operative care: Incentive Spirometry / deep breathing and coughing 10x/hr while awake.    Continue DVT prophylaxis with Teds and SCDs and SC Lovenox.  Continue GI prophylaxis with Protonix IV till able to tolerate PO.    Discharge WHEN tolerates 32 oz / 1000 mL of liquids.  Goal 40-50 gm of protein / day gradually.    ___________________________________________    Estill Bakes, MD,  FACS, FICS  12/31/2016  7:00 AM

## 2016-12-31 NOTE — Progress Notes (Signed)
BARIATRIC SURGERY PROGRESS NOTE                Vitals:    12/31/16 2147 01/01/17 0131 01/01/17 0610 01/01/17 0615   BP: (!) 140/80 136/71 139/84    Pulse: 79 71 70    Resp: 17 17 17     Temp: 98.8 ??F (37.1 ??C) 98.7 ??F (37.1 ??C) 99.4 ??F (37.4 ??C)    TempSrc: Oral Oral Oral    SpO2: 95% 95% 95%    Weight:    247 lb (112 kg)   Height:         I/O last 3 completed shifts:  In: 720 [P.O.:720]  Out: -   No intake/output data recorded.    DIET BARIATRIC FULL LIQUID;    Recent Results (from the past 48 hour(s))   Basic Metabolic Panel w/ Reflex to MG    Collection Time: 12/31/16  3:19 AM   Result Value Ref Range    Sodium 137 135 - 145 MMOL/L    Potassium reflex Magnesium 4.3 3.5 - 5.1 MMOL/L    Chloride 100 99 - 110 mMol/L    CO2 26 21 - 32 MMOL/L    Anion Gap 11 4 - 16    BUN 7 6 - 23 MG/DL    CREATININE 0.7 0.6 - 1.1 MG/DL    Glucose 295165 (H) 70 - 99 MG/DL    Calcium 8.1 (L) 8.3 - 10.6 MG/DL    GFR Non-African American >60 >60 mL/min/1.2773m2    GFR African American >60 >60 mL/min/1.6073m2   CBC auto differential    Collection Time: 12/31/16  3:19 AM   Result Value Ref Range    WBC 12.2 (H) 4.0 - 10.5 K/CU MM    RBC 4.36 4.2 - 5.4 M/CU MM    Hemoglobin 13.5 12.5 - 16.0 GM/DL    Hematocrit 62.140.0 37 - 47 %    MCV 91.7 78 - 100 FL    MCH 31.0 27 - 31 PG    MCHC 33.8 32.0 - 36.0 %    RDW 11.5 (L) 11.7 - 14.9 %    Platelets 256 140 - 440 K/CU MM    MPV 10.7 7.5 - 11.1 FL    Differential Type AUTOMATED DIFFERENTIAL     Segs Relative 86.8 (H) 36 - 66 %    Lymphocytes % 6.2 (L) 24 - 44 %    Monocytes % 6.5 (H) 0 - 4 %    Eosinophils % 0.0 0 - 3 %    Basophils % 0.1 0 - 1 %    Segs Absolute 10.6 K/CU MM    Lymphocytes # 0.8 K/CU MM    Monocytes # 0.8 K/CU MM    Eosinophils # 0.0 K/CU MM    Basophils # 0.0 K/CU MM    Nucleated RBC % 0.0 %    Total Nucleated RBC 0.0 K/CU MM    Total Immature Neutrophil 0.05 K/CU MM    Immature Neutrophil % 0.4 0 - 0.43 %       Scheduled Meds:  ??? buPROPion  300 mg Oral QAM   ??? sodium chloride  flush  10 mL Intravenous 2 times per day   ??? docusate sodium  100 mg Oral BID   ??? enoxaparin  40 mg Subcutaneous Daily   ??? pantoprazole  40 mg Intravenous BID   ??? sennosides-docusate sodium  2 tablet Oral BID   ??? ondansetron  4 mg Intravenous Q4H  Continuous Infusions:  ??? dextrose 5% and 0.45% NaCl with KCl 20 mEq 125 mL/hr at 01/01/17 1610       Physical Exam:  HEENT: Anicteric sclerae, Oropharyngeal mucosae moist, pink and intact.  Heart:  Normal S1 and S2, RRR  Lungs: Clear to auscultation bilaterally, No audible Wheezes or Rales.  Extremities: No edema.  Neuro: Alert and Oriented x 3, Non focal.  Abdomen: Soft, Benign, Non tender, Non distended, Positive bowel sounds.  Incision: Nicely healing: No erythema, No discharge.      Principal Problem:    Morbid obesity with BMI of 40.0-44.9, adult (HCC)  Active Problems:    Anxiety    Reactive depression    Gastroesophageal reflux disease without esophagitis    SUI (stress urinary incontinence, female)    Snores    Arthritis    Primary stabbing headache    Dyslipidemia  Resolved Problems:    * No resolved hospital problems. *      Assessment and Plan:  ANGLE DIRUSSO is a 36 y.o. female who is POD # 2 status post Laparoscopic robotic DaVinci-assisted sleeve gastrectomy around a  38-French bougie.  Pain and nausea under adequate control.    Yesterday's  gastrografin upper GI OK NO stricture, NO leaks.    CONTINUE    Bariatric stage I and II diet: Clear and Full liquids no sugar added    UP TO 4 Oz every 15 min, and over 15 min each.    Increase Ambulation to at least 4x/day walk in the hallways with assistance.  Respiratory peri-operative care: Incentive Spirometry / deep breathing and coughing 10x/hr while awake.    Continue DVT prophylaxis with Teds and SCDs and SC Lovenox.  Continue GI prophylaxis with Protonix IV till able to tolerate PO.    Discharge WHEN tolerates 32 oz / 1000 mL of liquids.    Goal 40-50 gm of protein / day gradually.      ___________________________________________    Estill Bakes, MD, FACS, FICS  01/01/2017  8:07 AM

## 2016-12-31 NOTE — Plan of Care (Signed)
Problem: SAFETY  Goal: Free from accidental physical injury  Outcome: Ongoing    Goal: Free from intentional harm  Outcome: Ongoing      Problem: DAILY CARE  Goal: Daily care needs are met  Outcome: Ongoing      Problem: PAIN  Goal: Patient's pain/discomfort is manageable  Outcome: Ongoing      Problem: SKIN INTEGRITY  Goal: Skin integrity is maintained or improved  Outcome: Ongoing      Problem: KNOWLEDGE DEFICIT  Goal: Patient/S.O. demonstrates understanding of disease process, treatment plan, medications, and discharge instructions.  Outcome: Ongoing      Problem: DISCHARGE BARRIERS  Goal: Patient's continuum of care needs are met  Outcome: Ongoing      Problem: Bowel Function - Altered:  Goal: Bowel elimination is within specified parameters  Bowel elimination is within specified parameters   Outcome: Ongoing      Problem: Fluid Volume - Imbalance:  Goal: Ability to achieve a balanced intake and output will improve  Ability to achieve a balanced intake and output will improve   Outcome: Ongoing      Problem: Pain:  Goal: Pain level will decrease  Pain level will decrease   Outcome: Ongoing    Goal: Control of acute pain  Control of acute pain   Outcome: Ongoing    Goal: Control of chronic pain  Control of chronic pain   Outcome: Ongoing

## 2016-12-31 NOTE — Progress Notes (Signed)
Hospitalist Progress Note      Name:  Linda Blevins DOB/Age/Sex: 11/27/1980  (36 y.o. female)   MRN & CSN:  [id number] & [id number] Admission Date/Time: 12/30/2016  8:43 AM   Location:  4021/4021-A PCP: Gala MurdochSue A Carter, CNP       Linda Blevins is a 36 y.o.  female  who presents with     Assessment and Plan:   Morbid obese s/p Lap robotic DaVinci assisted gastric sleeve 12/30/16.  BMI 43.6  -Antiemetic and pain management as per surgery  -ABX empirically  -Dr. Ward GivensKhouzam following  -increase ambulation, advance diet as epr surgery, incentive spirometry  -may discharge per surgery when tolerates 32 oz of fluids  ??  Depression per hx, continue Wellbutrin      Diet DIET BARIATRIC FULL LIQUID;   DVT Prophylaxis [x]  Lovenox, []   Heparin, []  SCDs, []  Ambulation   GI Prophylaxis [x]  PPI,  []  H2 Blocker,  []  Carafate,  []  Diet/Tube Feeds   Code Status Full Code     Medications:   Medications:   ??? buPROPion  300 mg Oral QAM   ??? sodium chloride flush  10 mL Intravenous 2 times per day   ??? docusate sodium  100 mg Oral BID   ??? enoxaparin  40 mg Subcutaneous Daily   ??? pantoprazole  40 mg Intravenous BID   ??? sennosides-docusate sodium  2 tablet Oral BID   ??? ondansetron  4 mg Intravenous Q4H      Infusions:   ??? dextrose 5% and 0.45% NaCl with KCl 20 mEq 125 mL/hr at 12/31/16 0541     PRN Meds:   diatrizoate meglumine-sodium 30 mL ONCE PRN   sodium chloride flush 10 mL PRN   potassium chloride 10 mEq PRN   magnesium sulfate 1 g PRN   acetaminophen 650 mg Q4H PRN   acetaminophen 650 mg Q4H PRN   oxyCODONE-acetaminophen 1 tablet Q4H PRN   Or     oxyCODONE-acetaminophen 2 tablet Q4H PRN   HYDROmorphone 1 mg Q2H PRN   magnesium hydroxide 30 mL Daily PRN   bisacodyl 10 mg Daily PRN   ondansetron 4 mg Q4H PRN   promethazine 12.5 mg Q6H PRN     Subjective:     Awake but drowsy from pain meds. Denies pain. + nausea and emesis with water today. Denies fevers, chills.     Objective:     Intake/Output Summary (Last 24 hours) at 12/31/16  1144  Last data filed at 12/30/16 1855   Gross per 24 hour   Intake          1920.83 ml   Output               10 ml   Net          1910.83 ml      Vitals:   Vitals:    12/31/16 1042   BP: 124/83   Pulse: 71   Resp: 17   Temp: 97 ??F (36.1 ??C)   SpO2: 99%     Physical Exam:   Gen:  awake,drowsy, cooperative, no apparent distress   EYES:Lids and lashes normal, pupils equal, round ,extra ocular muscles intact, sclera clear  ENT:  Normocephalic, oral pharynx with moist mucus membranes  NECK:  Supple, symmetrical, trachea midline  LUNGS:  Clear to auscultate bilaterally, no rales ronchi or wheezing noted. Normal resp effort on room air  CARDIOVASCULAR:  regular rate and rhythm, normal S1 and S2,no  murmur noted,no pitting edema  ABDOMEN: Normal BS, diffusely mildly tender without guarding, non distended, soft  MUSCULOSKELETAL:  ROM of all extremities grossly wnl  NEUROLOGIC: AOx 3  SKIN:  no rash, juandice or pallor       Data:       CBC   Recent Labs      12/31/16   0319   WBC  12.2*   HGB  13.5   HCT  40.0   PLT  256      BMP Recent Labs      12/31/16   0319   NA  137   K  4.3   CL  100   CO2  26   BUN  7   CREATININE  0.7         Electronically signed by Wyona Almas, DO on 12/31/2016 at 11:44 AM

## 2016-12-31 NOTE — Plan of Care (Signed)
Problem: SAFETY  Goal: Free from accidental physical injury  Outcome: Ongoing    Goal: Free from intentional harm  Outcome: Ongoing      Problem: DAILY CARE  Goal: Daily care needs are met  Outcome: Ongoing      Problem: PAIN  Goal: Patient's pain/discomfort is manageable  Outcome: Ongoing      Problem: SKIN INTEGRITY  Goal: Skin integrity is maintained or improved  Outcome: Ongoing      Problem: KNOWLEDGE DEFICIT  Goal: Patient/S.O. demonstrates understanding of disease process, treatment plan, medications, and discharge instructions.  Outcome: Ongoing      Problem: DISCHARGE BARRIERS  Goal: Patient's continuum of care needs are met  Outcome: Ongoing      Problem: Bowel Function - Altered:  Goal: Bowel elimination is within specified parameters  Bowel elimination is within specified parameters   Outcome: Ongoing      Problem: Fluid Volume - Imbalance:  Goal: Ability to achieve a balanced intake and output will improve  Ability to achieve a balanced intake and output will improve   Outcome: Ongoing

## 2017-01-01 MED FILL — ONDANSETRON HCL 4 MG/2ML IJ SOLN: 4 MG/2ML | INTRAMUSCULAR | Qty: 2

## 2017-01-01 MED FILL — BUPROPION HCL ER (XL) 150 MG PO TB24: 150 MG | ORAL | Qty: 2

## 2017-01-01 MED FILL — KCL IN DEXTROSE-NACL 20-5-0.45 MEQ/L-%-% IV SOLN: INTRAVENOUS | Qty: 1000

## 2017-01-01 MED FILL — PROMETHAZINE HCL 25 MG/ML IJ SOLN: 25 MG/ML | INTRAMUSCULAR | Qty: 1

## 2017-01-01 MED FILL — DOK PLUS 50-8.6 MG PO TABS: ORAL | Qty: 2

## 2017-01-01 MED FILL — OXYCODONE-ACETAMINOPHEN 5-325 MG PO TABS: 5-325 MG | ORAL | Qty: 2

## 2017-01-01 MED FILL — LOVENOX 40 MG/0.4ML SC SOLN: 40 MG/0.4ML | SUBCUTANEOUS | Qty: 0.4

## 2017-01-01 MED FILL — DOCUSATE SODIUM 100 MG PO CAPS: 100 MG | ORAL | Qty: 1

## 2017-01-01 MED FILL — PANTOPRAZOLE SODIUM 40 MG IV SOLR: 40 MG | INTRAVENOUS | Qty: 40

## 2017-01-01 NOTE — Progress Notes (Signed)
Hospitalist Progress Note      Name:  Linda Blevins DOB/Age/Sex: November 23, 1980  (36 y.o. female)   MRN & CSN:  [id number] & [id number] Admission Date/Time: 12/30/2016  8:43 AM   Location:  4021/4021-A PCP: Gala MurdochSue A Carter, CNP       Linda Blevins is a 36 y.o.  female  who presents with     Assessment and Plan:   Morbid obese s/p Lap robotic DaVinci assisted gastric sleeve 12/30/16.  BMI 43.6  -Antiemetic and pain management as per surgery  -ABX empirically   -Dr. Ward GivensKhouzam following  -increase ambulation, advance diet as per surgery, encouraged incentive spirometry  -may discharge per surgery when tolerates 32 oz of fluids  ??  Depression per hx, continue Wellbutrin      Diet DIET BARIATRIC FULL LIQUID;   DVT Prophylaxis [x]  Lovenox, []   Heparin, []  SCDs, []  Ambulation   GI Prophylaxis [x]  PPI,  []  H2 Blocker,  []  Carafate,  []  Diet/Tube Feeds   Code Status Full Code     Medications:   Medications:   ??? buPROPion  300 mg Oral QAM   ??? sodium chloride flush  10 mL Intravenous 2 times per day   ??? docusate sodium  100 mg Oral BID   ??? enoxaparin  40 mg Subcutaneous Daily   ??? pantoprazole  40 mg Intravenous BID   ??? sennosides-docusate sodium  2 tablet Oral BID   ??? ondansetron  4 mg Intravenous Q4H      Infusions:   ??? dextrose 5% and 0.45% NaCl with KCl 20 mEq 125 mL/hr at 01/01/17 0629     PRN Meds:     diatrizoate meglumine-sodium 30 mL ONCE PRN   sodium chloride flush 10 mL PRN   potassium chloride 10 mEq PRN   magnesium sulfate 1 g PRN   acetaminophen 650 mg Q4H PRN   acetaminophen 650 mg Q4H PRN   oxyCODONE-acetaminophen 1 tablet Q4H PRN   Or     oxyCODONE-acetaminophen 2 tablet Q4H PRN   HYDROmorphone 1 mg Q2H PRN   magnesium hydroxide 30 mL Daily PRN   bisacodyl 10 mg Daily PRN   ondansetron 4 mg Q4H PRN   promethazine 12.5 mg Q6H PRN     Subjective:     Awake. Continued nausea and emesis of liquids yesterday. Has not had anything yet this morning. No fevers, chills. No current abd pain.     Objective:       Intake/Output  Summary (Last 24 hours) at 01/01/17 0724  Last data filed at 12/31/16 1721   Gross per 24 hour   Intake              720 ml   Output                0 ml   Net              720 ml      Vitals:   Vitals:    01/01/17 0610   BP: 139/84   Pulse: 70   Resp: 17   Temp: 99.4 ??F (37.4 ??C)   SpO2: 95%     Physical Exam:   Gen:  Awake,tired appearing , cooperative,  EYES:Lids and lashes normal, pupils equal, round ,extra ocular muscles intact, sclera clear  ENT:  Normocephalic, oral pharynx with moist mucus membranes  NECK:  Supple, symmetrical, trachea midline  LUNGS:  Clear to auscultate bilaterally, no rales ronchi or wheezing noted.  Normal resp effort on room air  CARDIOVASCULAR:  regular rate and rhythm, normal S1 and S2,no murmur noted,no pitting edema  ABDOMEN: Normal BS, diffusely mildly tender without guarding, non distended, soft  MUSCULOSKELETAL:  ROM of all extremities grossly wnl  NEUROLOGIC: AOx 3  SKIN:  no rash, juandice or pallor       Data:       CBC   Recent Labs      12/31/16   0319   WBC  12.2*   HGB  13.5   HCT  40.0   PLT  256      BMP   Recent Labs      12/31/16   0319   NA  137   K  4.3   CL  100   CO2  26   BUN  7   CREATININE  0.7         Electronically signed by Wyona Almas, DO on 01/01/2017 at 7:24 AM

## 2017-01-01 NOTE — Progress Notes (Signed)
Please have someone correct my contact information to the appropriate address  Thank you!

## 2017-01-01 NOTE — Discharge Instructions (Signed)

## 2017-01-02 NOTE — Care Coordination-Inpatient (Signed)
[facility] Trans[facility] Initial Follow Up Call    Call within 2 business days of discharge: Yes    Patient: Linda DownsJessica S Blevins Patient DOB: December 14, 1980   MRN: [id number]  Reason for Admission: There are no discharge diagnoses documented for the most recent discharge.  Discharge Date: 01/01/17 RARS: Geisinger Risk Score: 0     Spoke with: patient    Facility: [facility]        Care Transitions 24 Hour Call    Care Transitions Interventions         Follow Up:  Attempted to reach patient for 24 hour post hospital follow up.  Message left with CTC contact and request for call back.   Future Appointments  Date Time Provider Department Center   01/06/2017 9:15 AM Ilene QuaMagued N Khouzam, MD [facility]       Caryn SectionAndrea Darrelle Barrell, RN

## 2017-01-02 NOTE — Telephone Encounter (Signed)
Called Linda Blevins to see how they were doing post-operatively. No answer and VM left.

## 2017-01-03 NOTE — Care Coordination-Inpatient (Signed)
[facility] Trans[facility] Initial Follow Up Call    Call within 2 business days of discharge: Yes    Patient: Linda DownsJessica S Blevins Patient DOB: [birth year]   MRN: [id number]  Reason for Admission: There are no discharge diagnoses documented for the most recent discharge.  Discharge Date: 01/01/17 RARS: Geisinger Risk Score: 0         [facility]        Care Transitions 24 Hour Call    Care Transitions Interventions         Follow Up:  Attempted to reach patient for Care Transition follow up.  No answer to phone.  Message left on patient's voicemail with CTC contact information and request for call back.   Future Appointments  Date Time Provider Department Center   01/06/2017 9:15 AM Ilene QuaMagued N Khouzam, MD [facility]       Caryn SectionAndrea Berenise Hunton, RN

## 2017-01-04 ENCOUNTER — Encounter

## 2017-01-04 NOTE — Care Coordination-Inpatient (Signed)
[facility] Transitions Initial Follow Up Call    Call within 2 business days of discharge: Yes    Patient: Linda Blevins Patient DOB: 28-Dec-1980   MRN: [id number]  Reason for Admission: There are no discharge diagnoses documented for the most recent discharge.  Discharge Date: 01/01/17 RARS: Geisinger Risk Score: 0     Spoke with:  patient    Facility: [facility]    Non-face-to-face services provided:  Assessment and support for treatment adherence and medication management-[id number]   Care Transitions 24 Hour Call    Care Transitions Interventions         Follow Up:  Spoke with patient.  Patient reports that she has been to her follow up appointment with her PCP and is following with her surgeon later this week.  Reports c/o occasional nausea relieved with Zofran as directed.  Medication reconciliation completed.  Patient reports that she is able to drive her self to appointments and has no issue with the cost of her medication.  Reports that she lives at home with spouse and that he "took the week off to be with her".  Patient states that her care needs are being met.  Denied the need for ongoing Care Transition.  CTC contact information given.  Encouraged patient to call if needs arise.    Future Appointments  Date Time Provider DWaKeeney  01/06/2017 9:15 AM MEstell Harpin MD [facility]       ADan Humphreys RN

## 2017-01-06 ENCOUNTER — Ambulatory Visit
Admit: 2017-01-06 | Discharge: 2017-01-06 | Payer: PRIVATE HEALTH INSURANCE | Attending: Surgery | Primary: Registered Nurse

## 2017-01-06 DIAGNOSIS — Z9884 Bariatric surgery status: Secondary | ICD-10-CM

## 2017-01-06 NOTE — Progress Notes (Signed)
BARIATRIC SURGERY POST OPERATIVE NOTE    SUBJECTIVE:    Patient presenting today referred from Linda MurdochSue A Carter, CNP, for   Chief Complaint   Patient presents with   ??? Bariatrics Post Op Follow Up     PO#1 OR 12/30/16 sleeve     BP 110/75 (Site: Right Arm, Position: Sitting, Cuff Size: Large Adult)    Pulse 76    Resp 16    Ht 5\' 2"  (1.575 m)    Wt 233 lb 8 oz (105.9 kg)    BMI 42.71 kg/m??      HPI: Linda DownsJessica S Blevins is a 36 y.o. female Patient is here for their first, follow up 1 weeks post op 12/30/16.    Date of Surgery: 2/29/18    Type of Surgery:   Laparoscopic robotic DaVinci-assisted sleeve gastrectomy around a  38-French bougie.    BMI: 42.71 Obesity Classification: Class III  Today's weight is 233.5 lbs, last visits weight was   Wt Readings from Last 3 Encounters:   01/06/17 233 lb 8 oz (105.9 kg)   01/01/17 247 lb (112 kg)   12/28/16 239 lb 11.2 oz (108.7 kg)   , total gain/loss is -7.2 lbs    Weight History:   Wt Readings from Last 3 Encounters:   01/06/17 233 lb 8 oz (105.9 kg)   01/01/17 247 lb (112 kg)   12/28/16 239 lb 11.2 oz (108.7 kg)       Total weight loss/gain -7.2 Lbs over 1 week.        How pleased are you with your current weight loss: pleased  If you are disappointed, what do you think is preventing you from increased weight loss?   Is patient experiencing any physical problems post surgery: No:   Is patient experiencing any dietary problems post surgery: No:   Food Intolerances: Denies any food intolerances since last seen.  How hungry are you? hungry  How full do you feel after eating? full  How fast do you eat? 20 min  Food log brought to appointment today: No    Breakfast: yes  Mid-AM Snack: yes  Lunch: yes  Mid-PM Snack: yes  Dinner: yes  Evening Snack: no    Liquids Consumed: 32-38 oz per day.  Times of day liquids consumed: all day  Patient taking protein supplement: Yes.  Brand of Supplement: Premier - 50-60 gm.  Patient taking multivitamins: Yes  PPI daily   NO GERD  Does patient  exercise: rarely  What prevents you from exercising: post op      Current Outpatient Prescriptions:   ???  Multiple Vitamins-Minerals (THERAPEUTIC MULTIVITAMIN-MINERALS) tablet, Take 1 tablet by mouth daily, Disp: , Rfl:   ???  calcium citrate-vitamin D (CITRICAL + D) 315-250 MG-UNIT TABS per tablet, Take 1 tablet by mouth 2 times daily (with meals), Disp: , Rfl:   ???  oxyCODONE-acetaminophen (PERCOCET) 5-325 MG per tablet, Take 1-2 tablets by mouth every 6 hours as needed for Pain for up to 7 days., Disp: 25 tablet, Rfl: 0  ???  pantoprazole (PROTONIX) 40 MG tablet, Take 1 tablet by mouth 2 times daily, Disp: 60 tablet, Rfl: 3  ???  senna-docusate (SENOKOT S) 8.6-50 MG per tablet, Take 2 tablets by mouth daily for 10 days, Disp: 60 tablet, Rfl: 3  ???  ondansetron (ZOFRAN ODT) 4 MG disintegrating tablet, Take 1 tablet by mouth every 4 hours as needed for Nausea or Vomiting, Disp: 30 tablet, Rfl: 3  ???  buPROPion (WELLBUTRIN XL) 300 MG extended release tablet, Take 1 tablet by mouth every morning, Disp: 30 tablet, Rfl: 3  Past Medical History:   Diagnosis Date   ??? Anxiety    ??? Back pain     "Sometimes"   ??? Depression    ??? Fatigue    ??? Morbid obesity (HCC)    ??? Sleep apnea     Uses CPAP   ??? Teeth missing     Upper And Lower   ??? Wears contact lenses     Bilateral    ??? Wears glasses    ??? Wisdom teeth extracted     4 Wisdom Teeth Extracted In Past      Past Surgical History:   Procedure Laterality Date   ??? DENTAL SURGERY      Teeth Extracted In Past   ??? ENDOSCOPY, COLON, DIAGNOSTIC  11/2016   ??? HYSTERECTOMY VAGINAL  2012   ??? LAPAROSCOPY  2011    "Twice"   ??? SLEEVE GASTRECTOMY  12/30/2016    Robotic   ??? TONSILLECTOMY AND ADENOIDECTOMY  2014   ??? WISDOM TOOTH EXTRACTION      4 Wisdom Teeth Extracted In Past     Social History     Social History   ??? Marital status: Married     Spouse name: N/A   ??? Number of children: N/A   ??? Years of education: 66     Occupational History   ??? RN [employer]     Social History Main Topics    ??? Smoking status: Never Smoker   ??? Smokeless tobacco: Never Used   ??? Alcohol use No   ??? Drug use: No   ??? Sexual activity: Yes     Partners: Male     Other Topics Concern   ??? None     Social History Narrative   ??? None     Review of Systems      OBJECTIVE:    Physical Exam    Assessment / Plan:    1. Status post laparoscopic sleeve gastrectomy    2. Status post bariatric surgery      BP 110/75 (Site: Right Arm, Position: Sitting, Cuff Size: Large Adult)    Pulse 76    Resp 16    Ht 5\' 2"  (1.575 m)    Wt 233 lb 8 oz (105.9 kg)    BMI 42.71 kg/m??        Liquids Consumed: 32-38 oz per day.  Times of day liquids consumed: all day  Patient taking protein supplement: Yes.  Brand of Supplement: Premier - 50-60 gm.  Patient taking multivitamins: Yes  PPI daily   NO GERD  Does patient exercise: rarely  What prevents you from exercising: post op    Follow Up:  Return in about 2 weeks (around 01/20/2017) for Bariatric follow up: diet, exercise & weight loss, Follow up Symptoms.    Estill Bakes, MD, FACS, FICS  Member of the [employer]    [phone]    01/06/17

## 2017-01-13 ENCOUNTER — Ambulatory Visit
Admit: 2017-01-13 | Discharge: 2017-01-13 | Payer: PRIVATE HEALTH INSURANCE | Attending: Surgery | Primary: Registered Nurse

## 2017-01-13 DIAGNOSIS — Z9884 Bariatric surgery status: Secondary | ICD-10-CM

## 2017-01-13 NOTE — Progress Notes (Signed)
BARIATRIC SURGERY POST OPERATIVE NOTE    SUBJECTIVE:    Patient presenting today referred from [name], CNP, for No chief complaint on file.    BP 120/77 (Site: Right Arm, Position: Sitting, Cuff Size: Large Adult)    Pulse 80    Ht 5\' 2"  (1.575 m)    Wt 229 lb 6.4 oz (104.1 kg)    BMI 41.96 kg/m??      HPI: Linda Blevins is a 36 y.o. female Patient is here for their second, follow up 2 weeks post op Gastric Sleeve.    Date of Surgery: 12/30/16    Type of Surgery: Laparoscopic robotic DaVinci-assisted sleeve gastrectomy around a  40-French bougie.    BMI:  Obesity Classification: Class III  Today's weight is 229.4 lbs, last visits weight was   Wt Readings from Last 3 Encounters:   01/13/17 229 lb 6.4 oz (104.1 kg)   01/06/17 233 lb 8 oz (105.9 kg)   01/01/17 247 lb (112 kg)   , total gain/loss is -4.1 lbs    Weight History:   Wt Readings from Last 3 Encounters:   01/13/17 229 lb 6.4 oz (104.1 kg)   01/06/17 233 lb 8 oz (105.9 kg)   01/01/17 247 lb (112 kg)       Total weight loss/gain -11.3 Lbs since surgery.          Abdominal pain is located around central incision. She believes it started on 01/10/17 after stopping her dog from fallen from vehicle.    How pleased are you with your current weight loss: Pretty happy  If you are disappointed, what do you think is preventing you from increased weight loss?   Is patient experiencing any physical problems post surgery: No  Is patient experiencing any dietary problems post surgery: No:  Food Intolerances: Denies any food intolerances since last seen.  How hungry are you? Not hungry  How full do you feel after eating? full  How fast do you eat? Only on liquids  Food log brought to appointment today: No    Breakfast: yes  Mid-AM Snack: yes  Lunch: yes  Mid-PM Snack: yes  Dinner: yes  Evening Snack: yes    Liquids Consumed: 50 oz per day.  Times of day liquids consumed: Throughout  Patient taking protein supplement: Yes.  Brand of Supplement: Premiere - 50 +  Gm  Patient taking multivitamins: Yes  Does patient exercise: never  What prevents you from exercising: Not cleared on exercise yet.        Current Outpatient Prescriptions:   ???  Multiple Vitamins-Minerals (THERAPEUTIC MULTIVITAMIN-MINERALS) tablet, Take 1 tablet by mouth daily, Disp: , Rfl:   ???  calcium citrate-vitamin D (CITRICAL + D) 315-250 MG-UNIT TABS per tablet, Take 1 tablet by mouth 2 times daily (with meals), Disp: , Rfl:   ???  pantoprazole (PROTONIX) 40 MG tablet, Take 1 tablet by mouth 2 times daily, Disp: 60 tablet, Rfl: 3  ???  ondansetron (ZOFRAN ODT) 4 MG disintegrating tablet, Take 1 tablet by mouth every 4 hours as needed for Nausea or Vomiting, Disp: 30 tablet, Rfl: 3  ???  buPROPion (WELLBUTRIN XL) 300 MG extended release tablet, Take 1 tablet by mouth every morning, Disp: 30 tablet, Rfl: 3  Past Medical History:   Diagnosis Date   ??? Anxiety    ??? Back pain     "Sometimes"   ??? Depression    ??? Fatigue    ??? Morbid obesity (  HCC)    ??? Sleep apnea     Uses CPAP   ??? Teeth missing     Upper And Lower   ??? Wears contact lenses     Bilateral    ??? Wears glasses    ??? Wisdom teeth extracted     4 Wisdom Teeth Extracted In Past      Past Surgical History:   Procedure Laterality Date   ??? DENTAL SURGERY      Teeth Extracted In Past   ??? ENDOSCOPY, COLON, DIAGNOSTIC  11/2016   ??? HYSTERECTOMY VAGINAL  2012   ??? LAPAROSCOPY  2011    "Twice"   ??? SLEEVE GASTRECTOMY  12/30/2016    Robotic   ??? TONSILLECTOMY AND ADENOIDECTOMY  2014   ??? WISDOM TOOTH EXTRACTION      4 Wisdom Teeth Extracted In Past     Social History     Social History   ??? Marital status: Married     Spouse name: N/A   ??? Number of children: N/A   ??? 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 Smoking status: Never Smoker   ??? Smokeless tobacco: Never Used   ??? Alcohol use No   ??? Drug use: No   ??? Sexual activity: Yes     Partners: Male     Other Topics Concern   ??? None     Social History Narrative    ??? None     Review of Systems   Constitutional: Negative for activity change, chills, diaphoresis and fever.   HENT: Negative for sore throat, trouble swallowing and voice change.    Eyes: Negative for photophobia and visual disturbance.   Respiratory: Negative for cough, shortness of breath and wheezing.    Cardiovascular: Negative for chest pain, palpitations and leg swelling.   Gastrointestinal: Negative for abdominal pain, anal bleeding, blood in stool, constipation, diarrhea, nausea and vomiting.   Endocrine: Negative for cold intolerance, heat intolerance, polydipsia and polyuria.   Genitourinary: Negative for dysuria, frequency and hematuria.   Musculoskeletal: Negative for joint swelling, myalgias and neck stiffness.   Skin: Negative for color change and rash.   Neurological: Negative for seizures, speech difficulty, light-headedness and numbness.   Hematological: Negative for adenopathy. Does not bruise/bleed easily.         OBJECTIVE:    Physical Exam   Constitutional: She is oriented to person, place, and time. She appears well-developed and well-nourished. No distress.   HENT:   Head: Normocephalic and atraumatic.   Eyes: Conjunctivae and EOM are normal. Pupils are equal, round, and reactive to light. No scleral icterus.   Neck: Normal range of motion. No JVD present. No tracheal deviation present. No thyromegaly present.   Cardiovascular: Normal rate, regular rhythm, normal heart sounds and intact distal pulses.  Exam reveals no gallop and no friction rub.    No murmur heard.  Pulmonary/Chest: Effort normal. No stridor. No respiratory distress. She has no wheezes. She has no rales. She exhibits no tenderness.   Abdominal: Soft. Bowel sounds are normal. She exhibits no distension and no mass. There is tenderness (Incisional NL discomfort - NO HERNIAS). There is no rebound and no guarding.   Musculoskeletal: Normal range of motion. She exhibits no edema or tenderness.   Lymphadenopathy:     She has no  cervical adenopathy.   Neurological: She is alert  and oriented to person, place, and time. No cranial nerve deficit. Coordination normal.   Skin: Skin is warm and dry. No rash noted. She is not diaphoretic. No erythema. No pallor.   Psychiatric: Her behavior is normal. Judgment and thought content normal.   Vitals reviewed.      Assessment / Plan:    1. Status post bariatric surgery    2. Status post laparoscopic sleeve gastrectomy      BP 120/77 (Site: Right Arm, Position: Sitting, Cuff Size: Large Adult)    Pulse 80    Ht 5\' 2"  (1.575 m)    Wt 229 lb 6.4 oz (104.1 kg)    BMI 41.96 kg/m??        No incisional concerns after her mild trauma -     Follow Up:  Return in about 1 week (around 01/20/2017) for Bariatric follow up: diet, exercise & weight loss, Follow up Symptoms.    Estill Bakes, MD, FACS, FICS  Member of the [employer]    [phone]    01/13/17

## 2017-01-18 ENCOUNTER — Encounter: Payer: Medicaid Other | Attending: Physical Medicine & Rehabilitation | Admitting: Physical Medicine & Rehabilitation

## 2017-01-18 DIAGNOSIS — R41840 Attention and concentration deficit: Secondary | ICD-10-CM | POA: Insufficient documentation

## 2017-01-18 DIAGNOSIS — F1910 Other psychoactive substance abuse, uncomplicated: Secondary | ICD-10-CM | POA: Insufficient documentation

## 2017-01-18 DIAGNOSIS — G931 Anoxic brain damage, not elsewhere classified: Secondary | ICD-10-CM | POA: Insufficient documentation

## 2017-01-19 ENCOUNTER — Ambulatory Visit
Admit: 2017-01-19 | Discharge: 2017-01-19 | Payer: PRIVATE HEALTH INSURANCE | Attending: Family | Primary: Registered Nurse

## 2017-01-19 DIAGNOSIS — Z9884 Bariatric surgery status: Secondary | ICD-10-CM

## 2017-01-19 NOTE — Progress Notes (Signed)
BARIATRIC SURGERY POST OPERATIVE NOTE    SUBJECTIVE:    Patient presenting today referred from [name], CNP, for   Chief Complaint   Patient presents with   ??? Weight Management     PO#3 OR 12/30/16 Sleeve/HHR, c/o LLQ pain with bending describes as sharp stabbing pain.   [name]    HPI: Linda Blevins is a 36 y.o. female presenting for post op visit.   BP 108/64 (Site: Right Arm, Position: Sitting, Cuff Size: Large Adult)    Pulse 91    Resp 16    Ht 5\' 2"  (1.575 m)    Wt 226 lb 9.6 oz (102.8 kg)    BMI 41.45 kg/m??      BMI: Body mass index is 41.45 kg/m??. Obesity Classification: Class III  Date of Surgery: 12/30/16    Type of Surgery:   Laparoscopic robotic DaVinci-assisted sleeve gastrectomy around a  40-French bougie.    BMI: 41.45   Obesity Classification: Class III  Today's weight is 226.6 lbs, last visits weight was   Wt Readings from Last 3 Encounters:   01/19/17 226 lb 9.6 oz (102.8 kg)   01/13/17 229 lb 6.4 oz (104.1 kg)   01/06/17 233 lb 8 oz (105.9 kg)   , total gain/loss is 2.8 lbs    Total weight loss/gain -14.1 Lbs over 3 week.      How pleased are you with your current weight loss: pleased  If you are disappointed, what do you think is preventing you from increased weight loss?   Is patient experiencing any physical problems post surgery: Yes  Is patient experiencing any dietary problems post surgery: No:   Food Intolerances: Denies any food intolerances since last seen.  How hungry are you? Every 3 hrs  How full do you feel after eating? satisfied  How fast do you eat? full  Food log brought to appointment today: No    Breakfast: yes  Mid-AM Snack: yes  Lunch: yes  Mid-PM Snack: yes  Dinner: yes  Evening Snack: yes    Liquids Consumed: 40-50oz per day.  Times of day liquids consumed: all day  Patient taking protein supplement: Yes.  Brand of Supplement: Premier  Patient taking multivitamins: Yes  Does patient exercise: intermittently  What prevents you from exercising: LLQ pain    Still having the LLQ  pain from lifting dog x1 week ago. No new or worsening pain.  No N/V or any other symptoms.   Incisions healing well, no erythema or drainage.   Protonix BID.   Pureed diet until 01/30/17.  Protein intake 50-60 grams.   Water intake, hydration is good 40-50.   Not requiring nausea meds.       Current Outpatient Prescriptions:   ???  Multiple Vitamins-Minerals (THERAPEUTIC MULTIVITAMIN-MINERALS) tablet, Take 1 tablet by mouth daily, Disp: , Rfl:   ???  calcium citrate-vitamin D (CITRICAL + D) 315-250 MG-UNIT TABS per tablet, Take 1 tablet by mouth 2 times daily (with meals), Disp: , Rfl:   ???  pantoprazole (PROTONIX) 40 MG tablet, Take 1 tablet by mouth 2 times daily, Disp: 60 tablet, Rfl: 3  ???  ondansetron (ZOFRAN ODT) 4 MG disintegrating tablet, Take 1 tablet by mouth every 4 hours as needed for Nausea or Vomiting, Disp: 30 tablet, Rfl: 3  ???  buPROPion (WELLBUTRIN XL) 300 MG extended release tablet, Take 1 tablet by mouth every morning, Disp: 30 tablet, Rfl: 3  Past Medical History:   Diagnosis Date   ???  Anxiety    ??? Back pain     "Sometimes"   ??? Depression    ??? Fatigue    ??? Morbid obesity (HCC)    ??? Sleep apnea     Uses CPAP   ??? Teeth missing     Upper And Lower   ??? Wears contact lenses     Bilateral    ??? Wears glasses    ??? Wisdom teeth extracted     4 Wisdom Teeth Extracted In Past      Past Surgical History:   Procedure Laterality Date   ??? DENTAL SURGERY      Teeth Extracted In Past   ??? ENDOSCOPY, COLON, DIAGNOSTIC  11/2016   ??? HYSTERECTOMY VAGINAL  2012   ??? LAPAROSCOPY  2011    "Twice"   ??? SLEEVE GASTRECTOMY  12/30/2016    Robotic   ??? TONSILLECTOMY AND ADENOIDECTOMY  2014   ??? WISDOM TOOTH EXTRACTION      4 Wisdom Teeth Extracted In Past     Social History     Social History   ??? Marital status: Married     Spouse name: N/A   ??? Number of children: N/A   ??? 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 Smoking status: Never Smoker   ??? Smokeless tobacco:  Never Used   ??? Alcohol use No   ??? Drug use: No   ??? Sexual activity: Yes     Partners: Male     Other Topics Concern   ??? None     Social History Narrative   ??? None     Review of Systems   Constitutional: Negative.  Negative for appetite change, fatigue and fever.   HENT: Negative for congestion, dental problem, hearing loss, rhinorrhea and trouble swallowing.    Eyes: Negative for pain.   Respiratory: Negative for chest tightness, shortness of breath and wheezing.    Cardiovascular: Negative for chest pain, palpitations and leg swelling.   Gastrointestinal: Positive for abdominal pain (left mid abdomen.). Negative for abdominal distention, anal bleeding, blood in stool, constipation, diarrhea, nausea and vomiting.        Incisions healing well.    Endocrine: Negative for cold intolerance and polydipsia.   Genitourinary: Negative for difficulty urinating and frequency.   Musculoskeletal: Positive for arthralgias. Negative for gait problem.   Skin: Negative for rash.   Allergic/Immunologic: Negative for environmental allergies.   Neurological: Negative for dizziness, seizures and syncope.   Hematological: Does not bruise/bleed easily.   Psychiatric/Behavioral: Negative for behavioral problems and suicidal ideas.      Physical Exam   Constitutional: She is oriented to person, place, and time. She appears well-developed and well-nourished.   Obese   HENT:   Head: Normocephalic and atraumatic.   Right Ear: Hearing and ear canal normal.   Left Ear: Hearing and ear canal normal.   Nose: Nose normal.   Mouth/Throat: Uvula is midline and oropharynx is clear and moist.   Eyes: Conjunctivae are normal. Pupils are equal, round, and reactive to light.   Neck: Normal range of motion.   Cardiovascular: Normal rate, regular rhythm and normal heart sounds.    Pulmonary/Chest: Effort normal and breath sounds normal. She has no decreased breath sounds. She has no wheezes. She has no rhonchi. She has no rales.   Abdominal: Soft. Bowel  sounds are normal.  She exhibits no distension and no mass. There is tenderness (Left mid- abdomen with Deep palpation.). There is no rebound and no guarding.   Incisions healing well. Scabs noted to left incisions, no erythema or drainage. No hernias.    Musculoskeletal: Normal range of motion. She exhibits no tenderness.   In all 4 extremities.    Neurological: She is alert and oriented to person, place, and time. She has normal strength. She exhibits normal muscle tone. GCS eye subscore is 4. GCS verbal subscore is 5. GCS motor subscore is 6.   Skin: Skin is warm and dry. No rash noted.   Psychiatric: She has a normal mood and affect. Her behavior is normal. Judgment normal.   Nursing note and vitals reviewed.     Assessment / Plan:    1. Status post laparoscopic sleeve gastrectomy  - Doing very well, weight loss. Good  - Soft mechanical diet in 2 weeks, 01/30/17.  - Discussed appropriate foods.   - Continue working on hydration.  - 2 weeks dr. Ward Givens.     2. Morbid obesity with BMI of 40.0-44.9, adult (HCC)  - discussed recommended BMI.   - Continue weight loss.   - 600-800 calories daily.     3. Musculoskeletal strain  - No hernias noted on exam.   - Recommend tylenol as needed.   - Light stretching and heat 20 minutes 3-4 times daily.   - Follow up with any worsening symptoms.       No orders of the defined types were placed in this encounter.    No orders of the defined types were placed in this encounter.      Follow Up:  Return in about 2 weeks (around 02/02/2017).    Camelia Phenes, CNP

## 2017-01-19 NOTE — Progress Notes (Signed)
Date of Surgery: 12/30/16    Type of Surgery:   Laparoscopic robotic DaVinci-assisted sleeve gastrectomy around a  40-French bougie.    BMI: 41.45   Obesity Classification: Class III  Today's weight is 226.6 lbs, last visits weight was   Wt Readings from Last 3 Encounters:   01/19/17 226 lb 9.6 oz (102.8 kg)   01/13/17 229 lb 6.4 oz (104.1 kg)   01/06/17 233 lb 8 oz (105.9 kg)   , total gain/loss is 2.8 lbs    Weight History:   Wt Readings from Last 3 Encounters:   01/19/17 226 lb 9.6 oz (102.8 kg)   01/13/17 229 lb 6.4 oz (104.1 kg)   01/06/17 233 lb 8 oz (105.9 kg)     Total weight loss/gain -14.1 Lbs over 3 week.      How pleased are you with your current weight loss: pleased  If you are disappointed, what do you think is preventing you from increased weight loss?   Is patient experiencing any physical problems post surgery: Yes  Is patient experiencing any dietary problems post surgery: No:   Food Intolerances: Denies any food intolerances since last seen.  How hungry are you? Every 3 hrs  How full do you feel after eating? satisfied  How fast do you eat? full  Food log brought to appointment today: No    Breakfast: yes  Mid-AM Snack: yes  Lunch: yes  Mid-PM Snack: yes  Dinner: yes  Evening Snack: yes    Liquids Consumed: 40-50oz per day.  Times of day liquids consumed: all day  Patient taking protein supplement: Yes.  Brand of Supplement: Premier  Patient taking multivitamins: Yes  Does patient exercise: intermittently  What prevents you from exercising: LLQ pain

## 2017-02-03 ENCOUNTER — Ambulatory Visit
Admit: 2017-02-03 | Discharge: 2017-02-03 | Payer: PRIVATE HEALTH INSURANCE | Attending: Surgery | Primary: Registered Nurse

## 2017-02-03 ENCOUNTER — Encounter: Attending: Surgery | Primary: Registered Nurse

## 2017-02-03 DIAGNOSIS — Z9884 Bariatric surgery status: Secondary | ICD-10-CM

## 2017-02-03 NOTE — Progress Notes (Signed)
BARIATRIC SURGERY POST OPERATIVE NOTE    SUBJECTIVE:    Patient presenting today referred from [name], CNP, for   Chief Complaint   Patient presents with   ??? Bariatrics Post Op Follow Up     P/O #4, Gastric Sleeve     BP 104/76 (Site: Right Arm, Position: Sitting, Cuff Size: Large Adult)    Pulse 85    Ht 5\' 2"  (1.575 m)    Wt 222 lb 12.8 oz (101.1 kg)    BMI 40.75 kg/m??      HPI: Linda Blevins is a 36 y.o. female Patient is here for their fourth, follow up 1.5 months post op Gastric Sleeve.    Date of Surgery: 12/30/16    Type of Surgery: Laparoscopic robotic DaVinci-assisted sleeve gastrectomy around a  40-French bougie.    BMI:  Obesity Classification: Class III  Today's weight is 222.8lbs, last visits weight was   Wt Readings from Last 3 Encounters:   02/03/17 222 lb 12.8 oz (101.1 kg)   01/19/17 226 lb 9.6 oz (102.8 kg)   01/13/17 229 lb 6.4 oz (104.1 kg)     Total gain/loss is -3.8lbs    Weight History:   Wt Readings from Last 3 Encounters:   02/03/17 222 lb 12.8 oz (101.1 kg)   01/19/17 226 lb 9.6 oz (102.8 kg)   01/13/17 229 lb 6.4 oz (104.1 kg)       Total weight loss/gain -17.9 Lbs since surgery - ONE month.        How pleased are you with your current weight loss: Yes  If you are disappointed, what do you think is preventing you from increased weight loss?   Is patient experiencing any physical problems post surgery: No:  Is patient experiencing any dietary problems post surgery: No:  Food Intolerances: Denies any food intolerances since last seen.  How hungry are you? 2.5-3 hrs feels hungry  How full do you feel after eating? comfortable  How fast do you eat? 30 mins  Food log brought to appointment today: No    Breakfast: yes  Mid-AM Snack: no  Lunch: yes  Mid-PM Snack: yes  Dinner: yes  Evening Snack: no    Liquids Consumed: 50-60 oz per day.  Times of day liquids consumed: Throughout  Patient taking protein supplement: Yes.  Brand of Supplement:  Patient taking multivitamins: Yes  Does  patient exercise: three times a week - Cardio  What prevents you from exercising:    400-500 Kcal.  Will increase to ~ 600-800 Kcal.  50-60 gm prtn.    Off pain meds  PPI bid x 38mo      Current Outpatient Prescriptions:   ???  Multiple Vitamins-Minerals (THERAPEUTIC MULTIVITAMIN-MINERALS) tablet, Take 1 tablet by mouth daily, Disp: , Rfl:   ???  calcium citrate-vitamin D (CITRICAL + D) 315-250 MG-UNIT TABS per tablet, Take 1 tablet by mouth 2 times daily (with meals), Disp: , Rfl:   ???  pantoprazole (PROTONIX) 40 MG tablet, Take 1 tablet by mouth 2 times daily, Disp: 60 tablet, Rfl: 3  ???  ondansetron (ZOFRAN ODT) 4 MG disintegrating tablet, Take 1 tablet by mouth every 4 hours as needed for Nausea or Vomiting, Disp: 30 tablet, Rfl: 3  ???  buPROPion (WELLBUTRIN XL) 300 MG extended release tablet, Take 1 tablet by mouth every morning, Disp: 30 tablet, Rfl: 3  Past Medical History:   Diagnosis Date   ??? Anxiety    ??? Back  pain     "Sometimes"   ??? Depression    ??? Fatigue    ??? Morbid obesity (HCC)    ??? Sleep apnea     Uses CPAP   ??? Teeth missing     Upper And Lower   ??? Wears contact lenses     Bilateral    ??? Wears glasses    ??? Wisdom teeth extracted     4 Wisdom Teeth Extracted In Past      Past Surgical History:   Procedure Laterality Date   ??? DENTAL SURGERY      Teeth Extracted In Past   ??? ENDOSCOPY, COLON, DIAGNOSTIC  11/2016   ??? HYSTERECTOMY VAGINAL  2012   ??? LAPAROSCOPY  2011    "Twice"   ??? SLEEVE GASTRECTOMY  12/30/2016    Robotic   ??? TONSILLECTOMY AND ADENOIDECTOMY  2014   ??? WISDOM TOOTH EXTRACTION      4 Wisdom Teeth Extracted In Past     Social History     Social History   ??? Marital status: Married     Spouse name: N/A   ??? Number of children: N/A   ??? 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 Smoking status: Never Smoker   ??? Smokeless tobacco: Never Used   ??? Alcohol use No   ??? Drug use: No   ??? Sexual activity: Yes     Partners: Male     Other Topics  Concern   ??? None     Social History Narrative   ??? None     Review of Systems      OBJECTIVE:    Physical Exam    Assessment / Plan:    1. Status post bariatric surgery    2. Status post laparoscopic sleeve gastrectomy          400-500 Kcal.  Will increase to ~ 600-800 Kcal.  50-60 gm prtn.    Off pain meds  PPI bid x 13mo    Follow Up:  Return in about 2 months (around 04/05/2017) for Follow up Symptoms.    Estill BakesMagued Amberia Bayless, MD, FACS, FICS  Member of the [employer]    [phone]    02/03/17

## 2017-02-17 ENCOUNTER — Encounter: Attending: Surgery | Primary: Registered Nurse

## 2017-02-17 IMAGING — DX DG CHEST 1V PORT
2 series · 2 of 2 positions shown · non-contrast
Comparison: 08/06/2016.

CLINICAL DATA: Respiratory failure.

EXAM:
PORTABLE CHEST 1 VIEW

[chest ap (1 of 2)]
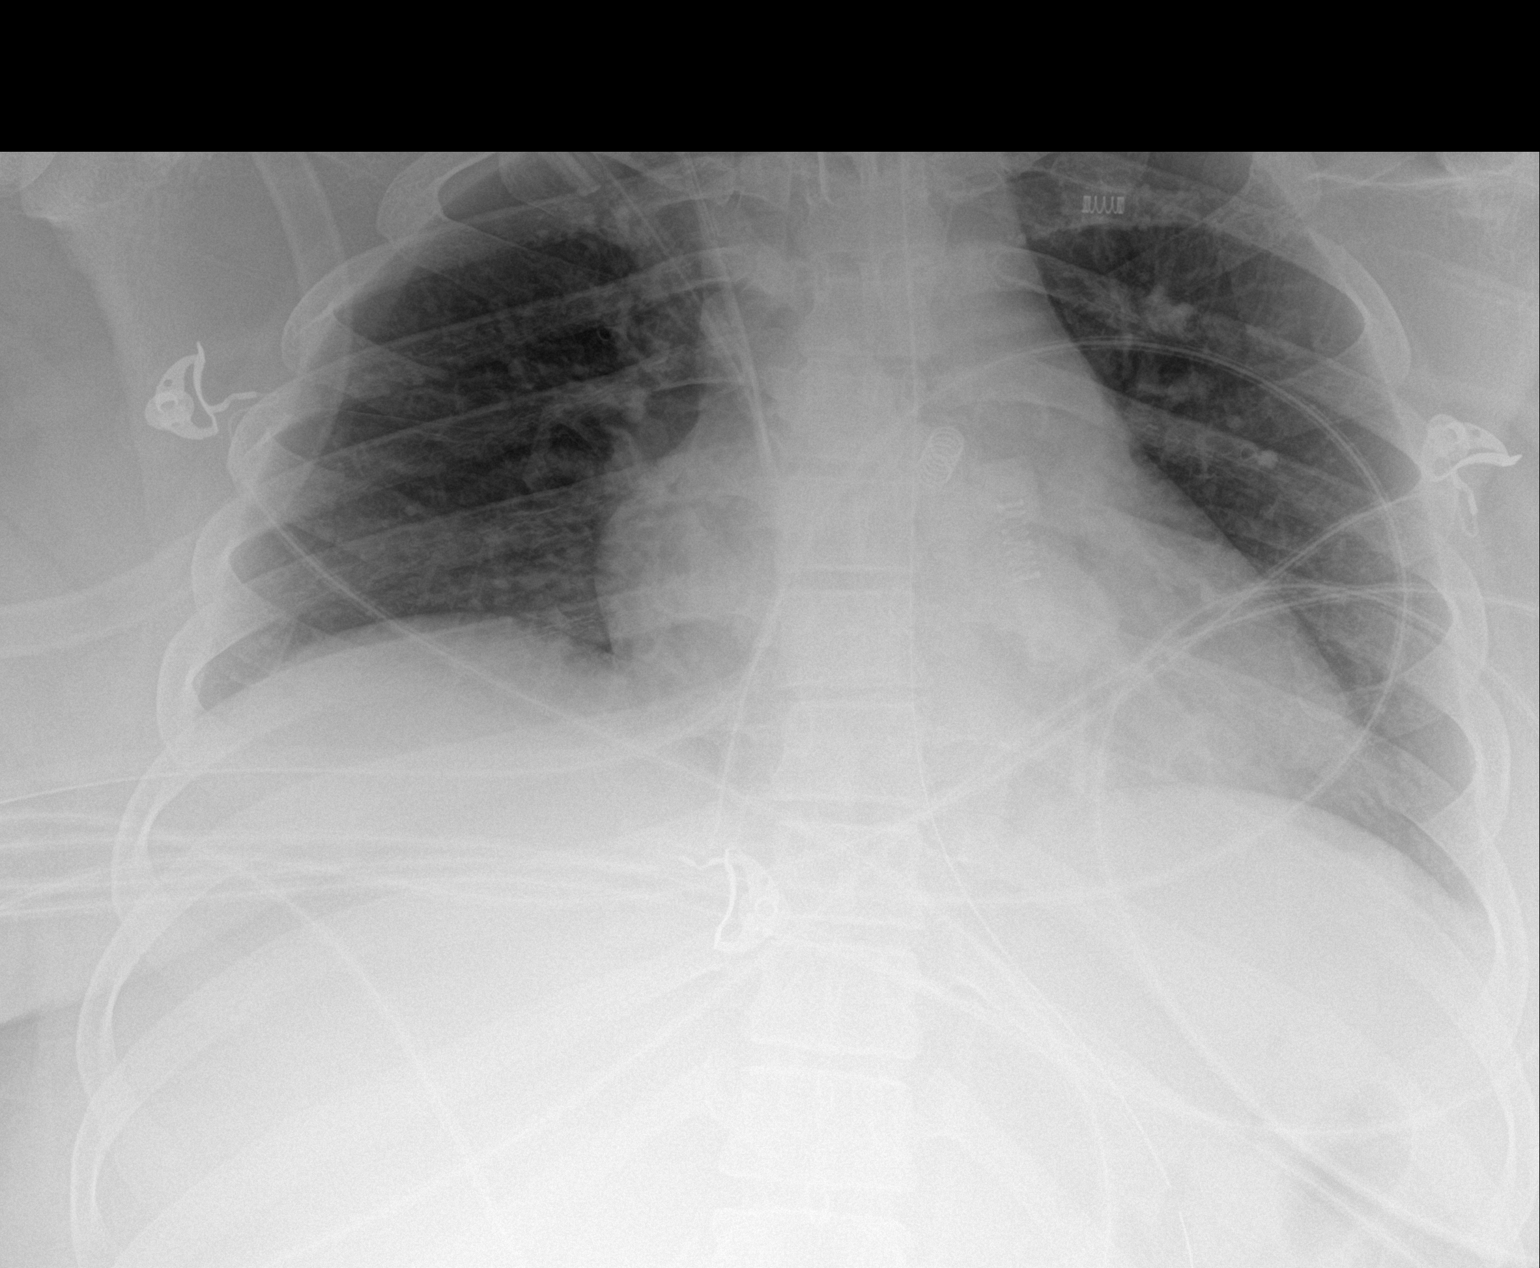

[chest ap (2 of 2)]
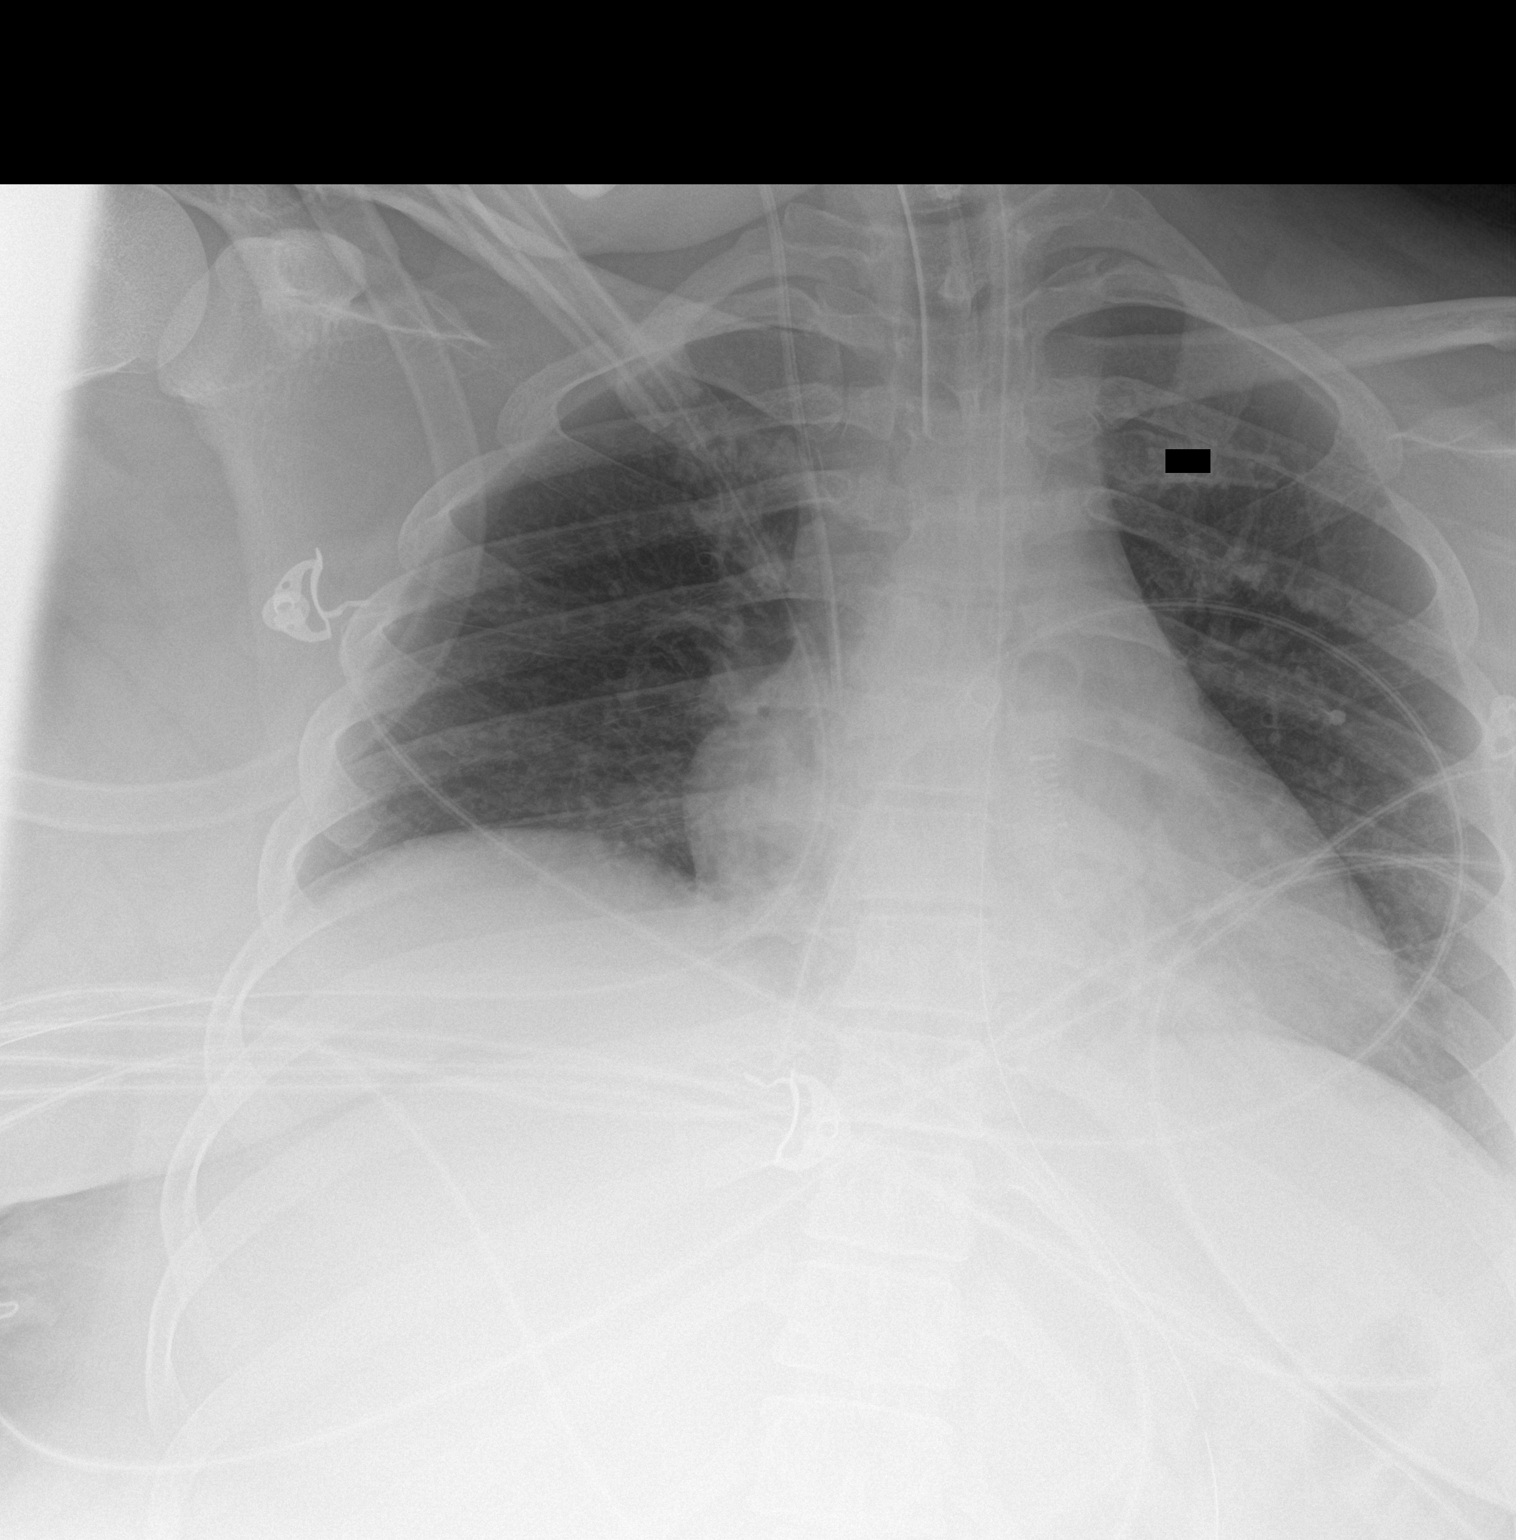

[2 of 2 positions shown; findings below may reference images not displayed]

FINDINGS: Cardiomegaly. Unchanged endotracheal tube, and central venous
catheter. Low lung volumes with mild vascular congestion. No
infiltrates or failure.
IMPRESSION: Stable cardiomegaly, support apparatus and mild vascular congestion.

## 2017-02-22 ENCOUNTER — Encounter: Attending: Surgery | Primary: Registered Nurse

## 2017-03-27 LAB — BE WELL HEALTH SCREEN
Cholesterol: 165 MG/DL (ref <200)
Glucose: 89 MG/DL (ref 70–99)
HDL: 33 MG/DL — ABNORMAL LOW (ref >40)
LDL Calculated: 113 MG/DL — ABNORMAL HIGH (ref <100)
Triglycerides: 97 MG/DL (ref <150)

## 2017-04-12 ENCOUNTER — Encounter: Attending: Surgery | Primary: Registered Nurse

## 2017-04-19 ENCOUNTER — Ambulatory Visit
Admit: 2017-04-19 | Discharge: 2017-04-19 | Payer: PRIVATE HEALTH INSURANCE | Attending: Surgery | Primary: Registered Nurse

## 2017-04-19 DIAGNOSIS — Z9884 Bariatric surgery status: Secondary | ICD-10-CM

## 2017-04-19 NOTE — Progress Notes (Signed)
BARIATRIC SURGERY POST OPERATIVE NOTE    SUBJECTIVE:    Patient presenting today referred from [name], APRN - CNP, for   Chief Complaint   Patient presents with   . Bariatrics Post Op Follow Up     5th P/O Gastric Sleeve     BP 116/82 (Site: Right Arm, Position: Sitting, Cuff Size: Large Adult)   Pulse 80   Ht 5\' 2"  (1.575 m)   Wt 203 lb 6.4 oz (92.3 kg)   BMI 37.20 kg/m      HPI: Linda Blevins is a 36 y.o. female Patient is here for their fifth, follow up 3 months post op Gastric Sleeve.    Date of Surgery: 12/30/16    Type of Surgery: Laparoscopic robotic DaVinci-assisted sleeve gastrectomy around a  38-French bougie.    BMI:  Obesity Classification: Class II  Today's weight is 203.4 lbs, last visits weight was   Wt Readings from Last 3 Encounters:   04/19/17 203 lb 6.4 oz (92.3 kg)   03/27/17 208 lb (94.3 kg)   02/03/17 222 lb 12.8 oz (101.1 kg)     Total gain/loss is -19.4 lbs    Weight History:   Wt Readings from Last 3 Encounters:   04/19/17 203 lb 6.4 oz (92.3 kg)   03/27/17 208 lb (94.3 kg)   02/03/17 222 lb 12.8 oz (101.1 kg)       Total weight loss/gain -37.3 Lbs since surgery over 3 month.              How pleased are you with your current weight loss: Yes and No - feels like weight is coming off too slow.  If you are disappointed, what do you think is preventing you from increased weight loss?   Is patient experiencing any physical problems post surgery: No:  Is patient experiencing any dietary problems post surgery: No:  Food Intolerances: Denies any food intolerances since last seen.  How hungry are you? Hungry  How full do you feel after eating? Satisfied  How fast do you eat? 15-20 mins  Food log brought to appointment today: No    Breakfast: yes  Mid-AM Snack: no  Lunch: yes  Mid-PM Snack: yes  Dinner: yes  Evening Snack: no    Liquids Consumed: 64 -80 oz per day.  Times of day liquids consumed: Throughout  Patient taking protein supplement: Yes.  Brand of Supplement: Premier  Patient  taking multivitamins: Yes  Does patient exercise: three times a week  What prevents you from exercising:     Addressed the status of the following co-morbidities:   1. Anxiety  improving.  2. Depression  improving.  3. OSA  improving.      Current Outpatient Prescriptions:   .  vitamin B-12 (CYANOCOBALAMIN) 500 MCG tablet, Take 500 mcg by mouth daily, Disp: , Rfl:   .  Multiple Vitamins-Minerals (THERAPEUTIC MULTIVITAMIN-MINERALS) tablet, Take 1 tablet by mouth daily, Disp: , Rfl:   .  calcium citrate-vitamin D (CITRICAL + D) 315-250 MG-UNIT TABS per tablet, Take 1 tablet by mouth 2 times daily (with meals), Disp: , Rfl:   .  pantoprazole (PROTONIX) 40 MG tablet, Take 1 tablet by mouth 2 times daily, Disp: 60 tablet, Rfl: 3  .  ondansetron (ZOFRAN ODT) 4 MG disintegrating tablet, Take 1 tablet by mouth every 4 hours as needed for Nausea or Vomiting, Disp: 30 tablet, Rfl: 3  .  buPROPion (WELLBUTRIN XL) 300 MG extended release tablet, Take  1 tablet by mouth every morning, Disp: 30 tablet, Rfl: 3  Past Medical History:   Diagnosis Date   . Anxiety    . Back pain     "Sometimes"   . Depression    . Fatigue    . Morbid obesity (HCC)    . Sleep apnea     Uses CPAP   . Teeth missing     Upper And Lower   . Wears contact lenses     Bilateral    . Wears glasses    . Wisdom teeth extracted     4 Wisdom Teeth Extracted In Past      Past Surgical History:   Procedure Laterality Date   . DENTAL SURGERY      Teeth Extracted In Past   . ENDOSCOPY, COLON, DIAGNOSTIC  11/2016   . HYSTERECTOMY VAGINAL  2012   . LAPAROSCOPY  2011    "Twice"   . SLEEVE GASTRECTOMY  12/30/2016    Robotic   . TONSILLECTOMY AND ADENOIDECTOMY  2014   . WISDOM TOOTH EXTRACTION      4 Wisdom Teeth Extracted In Past     Social History     Social History   . Marital status: Married     Spouse name: N/A   . Number of children: N/A   . Years of education: 6014     Occupational History   . RN [employer]     Social History Main Topics   . Smoking  status: Never Smoker   . Smokeless tobacco: Never Used   . Alcohol use No   . Drug use: No   . Sexual activity: Yes     Partners: Male     Other Topics Concern   . None     Social History Narrative   . None     Review of Systems   Constitutional: Positive for fatigue. Negative for activity change, chills, diaphoresis and fever.   HENT: Negative for sore throat, trouble swallowing and voice change.    Eyes: Negative for photophobia and visual disturbance.   Respiratory: Positive for shortness of breath. Negative for cough and wheezing.    Cardiovascular: Positive for leg swelling. Negative for chest pain and palpitations.   Gastrointestinal: Positive for abdominal distention and abdominal pain. Negative for anal bleeding, blood in stool, constipation, diarrhea, nausea and vomiting.   Endocrine: Positive for polyphagia. Negative for cold intolerance, heat intolerance, polydipsia and polyuria.   Genitourinary: Positive for urgency. Negative for dysuria, frequency and hematuria.   Musculoskeletal: Positive for arthralgias, back pain and gait problem. Negative for joint swelling, myalgias and neck stiffness.   Skin: Negative for color change and rash.   Neurological: Negative for seizures, speech difficulty, light-headedness and numbness.   Hematological: Negative for adenopathy. Does not bruise/bleed easily.   Psychiatric/Behavioral: Positive for sleep disturbance. The patient is nervous/anxious.          OBJECTIVE:    Physical Exam   Constitutional: She is oriented to person, place, and time. She appears well-developed and well-nourished. No distress.   HENT:   Head: Normocephalic and atraumatic.   Eyes: Conjunctivae and EOM are normal. Pupils are equal, round, and reactive to light. No scleral icterus.   Neck: Normal range of motion. No JVD present. No tracheal deviation present. No thyromegaly present.   Cardiovascular: Normal rate, regular rhythm, normal heart sounds and intact distal pulses.  Exam reveals no gallop  and no friction rub.  No murmur heard.  Pulmonary/Chest: Effort normal. No stridor. No respiratory distress. She has no wheezes. She has no rales. She exhibits no tenderness.   Abdominal: Soft. Bowel sounds are normal. She exhibits no distension and no mass. There is no tenderness. There is no rebound and no guarding.   Musculoskeletal: Normal range of motion. She exhibits no edema or tenderness.   Lymphadenopathy:     She has no cervical adenopathy.   Neurological: She is alert and oriented to person, place, and time. No cranial nerve deficit. Coordination normal.   Skin: Skin is warm and dry. No rash noted. She is not diaphoretic. No erythema. No pallor.   Psychiatric: Her behavior is normal. Judgment and thought content normal.   Vitals reviewed.      Assessment / Plan:    1. Status post laparoscopic sleeve gastrectomy    2. Status post bariatric surgery    3. Morbid obesity due to excess calories (HCC)          900 / day  Hungry q 3-5 hrs and small snacks does it.  See Annice Pih.    Follow Up:  Return in about 2 months (around 06/19/2017) for Bariatric follow up: diet, exercise & weight loss, Follow up Symptoms.    Estill Bakes, MD, FACS, FICS  Member of the [employer]    [phone]    04/19/17

## 2017-05-02 ENCOUNTER — Encounter: Primary: Registered Nurse

## 2017-06-13 MED ORDER — BUPROPION HCL ER (XL) 300 MG PO TB24
300 | ORAL_TABLET | Freq: Every morning | ORAL | 1 refills | Status: DC
Start: 2017-06-13 — End: 2017-06-23

## 2017-06-14 ENCOUNTER — Ambulatory Visit (INDEPENDENT_AMBULATORY_CARE_PROVIDER_SITE_OTHER): Payer: Medicaid Other | Admitting: Family Medicine

## 2017-06-14 ENCOUNTER — Encounter: Payer: Self-pay | Admitting: Family Medicine

## 2017-06-14 VITALS — BP 117/82 | HR 109 | Temp 98.0°F | Ht 63.25 in | Wt 211.0 lb

## 2017-06-14 DIAGNOSIS — L509 Urticaria, unspecified: Secondary | ICD-10-CM

## 2017-06-14 DIAGNOSIS — Z7689 Persons encountering health services in other specified circumstances: Secondary | ICD-10-CM

## 2017-06-14 DIAGNOSIS — R600 Localized edema: Secondary | ICD-10-CM | POA: Diagnosis not present

## 2017-06-14 MED ORDER — TRIAMCINOLONE ACETONIDE 0.1 % EX CREA: 1.0000 "application " | TOPICAL_CREAM | Freq: Two times a day (BID) | CUTANEOUS | 1 refills | Status: DC

## 2017-06-14 MED ORDER — HYDROCHLOROTHIAZIDE 12.5 MG PO TABS: 12.5000 mg | ORAL_TABLET | Freq: Every day | ORAL | 0 refills | Status: DC

## 2017-06-14 MED ORDER — PREDNISONE 10 MG PO TABS: ORAL_TABLET | ORAL | 0 refills | Status: DC

## 2017-06-14 NOTE — Patient Instructions (Signed)
Schedule a complete physical exam at your own convenience over the next 6 months or so.

## 2017-06-14 NOTE — Progress Notes (Signed)
BP 117/82   Pulse (!) 109   Temp 98 F (36.7 C)   Ht 5' 3.25" (1.607 m)   Wt 211 lb (95.7 kg)   LMP 05/15/2017 (Approximate)   SpO2 98%   BMI 37.08 kg/m    Subjective:    Patient ID: Martha Lawson, female    DOB: June 29, 1981, 36 y.o.   MRN: [id number]  HPI: Martha GravelJessica Z Overall is a 36 y.o. female  Chief Complaint  Patient presents with  . Establish Care  . Foot Swelling    both feet x 6 months. Will go down some if she stays off her feet but never goes away.  . Rash    x 2 days. arms, legs, feet. Itches. No changes in soaps or detergants.    Patient presents today to establish care. Only medication that she's on is the phentermine, which she gets from [facility]. Going well so far. Has lost 30 lb in 2 months with this. Does have a pmhx of substance abuse as well as overdose with anoxic brain injury 07/2016.   Does have a few concerns today. Swelling in both feet x 6 months, gets worse throughout the day. Improves with rest and elevation but still present to an extent. No localized pain or redness, just overall heaviness. Does not regularly exercise.   Also notes a rash that came up 2 days ago on her arms, legs, and feet. No changes in diet or care products, recent travel, outdoor exposures, tick bites. Rash is very itchy, does not burn. Has not been putting anything on it.   Past Medical History:  Diagnosis Date  . Anoxic brain injury (HCC) 07/2016  . Anoxic encephalopathy (HCC)   . Asthma    in fall due to allergies  . History of MRSA infection 07/2016   in patient's trach  . History of substance abuse    Relevant past medical, surgical, family and social history reviewed and updated as indicated. Interim medical history since our last visit reviewed. Allergies and medications reviewed and updated.  Review of Systems  Constitutional: Negative.   HENT: Negative.   Respiratory: Negative.   Cardiovascular: Positive for leg swelling.  Gastrointestinal: Negative.     Genitourinary: Negative.   Musculoskeletal: Negative.   Skin: Positive for rash.  Neurological: Negative.   Psychiatric/Behavioral: Negative.    Per HPI unless specifically indicated above     Objective:    BP 117/82   Pulse (!) 109   Temp 98 F (36.7 C)   Ht 5' 3.25" (1.607 m)   Wt 211 lb (95.7 kg)   LMP 05/15/2017 (Approximate)   SpO2 98%   BMI 37.08 kg/m   Wt Readings from Last 3 Encounters:  06/14/17 211 lb (95.7 kg)  08/12/16 228 lb 12.8 oz (103.8 kg)    Physical Exam  Constitutional: She appears well-developed and well-nourished.  HENT:  Head: Atraumatic.  Eyes: Pupils are equal, round, and reactive to light. Conjunctivae are normal. No scleral icterus.  Neck: Normal range of motion. Neck supple.  Cardiovascular: Intact distal pulses.   Pulmonary/Chest: Effort normal and breath sounds normal. No respiratory distress.  Musculoskeletal: Normal range of motion. She exhibits edema (2+ edema b/l LEs). She exhibits no tenderness.  - homans and squeeze test b/l LEs  Neurological: She is alert.  Skin: Skin is warm and dry. Rash (erythematous urticarial rash on all 4 extremities diffusely) noted.  Psychiatric: She has a normal mood and affect. Her behavior is  normal.  Nursing note and vitals reviewed.     Assessment & Plan:   Problem List Items Addressed This Visit    None    Visit Diagnoses    Encounter to establish care    -  Primary   Last CPE 01/2016   Bilateral leg edema       Will start 12.5 mg HCTZ. Discussed sxs to watch for that may indicate hypotension and to stay hydrated. BMP today. Compression and elevation, exercise   Relevant Orders   Comprehensive metabolic panel (Completed)   Urticaria       Possibly stress-related, under a good deal of stress lately. Will start long prednisone taper and benadryl 1-2 times daily. Kenalog for spot tx.        Follow up plan: Return in about 4 weeks (around 07/12/2017) for BP.

## 2017-06-15 ENCOUNTER — Encounter: Payer: Self-pay | Admitting: Family Medicine

## 2017-06-15 LAB — COMPREHENSIVE METABOLIC PANEL
A/G RATIO: 1.3 (ref 1.2–2.2)
ALT: 14 IU/L (ref 0–32)
AST: 15 IU/L (ref 0–40)
Albumin: 4 g/dL (ref 3.5–5.5)
Alkaline Phosphatase: 46 IU/L (ref 39–117)
BILIRUBIN TOTAL: 0.5 mg/dL (ref 0.0–1.2)
BUN/Creatinine Ratio: 15 (ref 9–23)
BUN: 13 mg/dL (ref 6–20)
CHLORIDE: 98 mmol/L (ref 96–106)
CO2: 27 mmol/L (ref 20–29)
Calcium: 9.1 mg/dL (ref 8.7–10.2)
Creatinine, Ser: 0.85 mg/dL (ref 0.57–1.00)
GFR calc Af Amer: 103 mL/min/{1.73_m2} (ref >59)
GFR calc non Af Amer: 90 mL/min/{1.73_m2} (ref >59)
Globulin, Total: 3 g/dL (ref 1.5–4.5)
Glucose: 84 mg/dL (ref 65–99)
POTASSIUM: 3.8 mmol/L (ref 3.5–5.2)
SODIUM: 139 mmol/L (ref 134–144)
TOTAL PROTEIN: 7 g/dL (ref 6.0–8.5)

## 2017-06-21 ENCOUNTER — Ambulatory Visit
Admit: 2017-06-21 | Discharge: 2017-06-21 | Payer: PRIVATE HEALTH INSURANCE | Attending: Surgery | Primary: Registered Nurse

## 2017-06-21 DIAGNOSIS — Z9884 Bariatric surgery status: Secondary | ICD-10-CM

## 2017-06-21 NOTE — Progress Notes (Signed)
BARIATRIC SURGERY POST OPERATIVE NOTE    SUBJECTIVE:    Patient presenting today referred from [name], APRN - CNP, for   Chief Complaint   Patient presents with   . Bariatrics Post Op Follow Up     PO#6 OR 2/29/18 Sleeve/64fr     BP 122/71 (Site: Right Arm, Position: Sitting, Cuff Size: Large Adult)   Pulse 78   Resp 16   Ht 5\' 2"  (1.575 m)   Wt 190 lb 11.2 oz (86.5 kg)   BMI 34.88 kg/m      HPI: Linda Blevins is a 36 y.o. female Patient is here for their sixth, follow up 6 months post op 12/30/16.    Date of Surgery: 12/30/16    Type of Surgery:   Laparoscopic robotic DaVinci-assisted sleeve gastrectomy around a  38-French bougie.    BMI: 34.88 Obesity Classification: Class I  Today's weight is 190.7 lbs, last visits weight was   Wt Readings from Last 3 Encounters:   06/21/17 190 lb 11.2 oz (86.5 kg)   04/19/17 203 lb 6.4 oz (92.3 kg)   03/27/17 208 lb (94.3 kg)     Total gain/loss is -12.7 lbs    Weight History:   Wt Readings from Last 3 Encounters:   06/21/17 190 lb 11.2 oz (86.5 kg)   04/19/17 203 lb 6.4 oz (92.3 kg)   03/27/17 208 lb (94.3 kg)       Total weight loss/gain -50 Lbs over 6 month.            How pleased are you with your current weight loss: very pleased  If you are disappointed, what do you think is preventing you from increased weight loss?   Is patient experiencing any physical problems post surgery: No:   Is patient experiencing any dietary problems post surgery: No:   Food Intolerances: Denies any food intolerances since last seen.  How hungry are you? hungry  How full do you feel after eating? fine  How fast do you eat? 20-30 min  Food log brought to appointment today: No    Breakfast: yes  Mid-AM Snack: yes  Lunch: yes  Mid-PM Snack: yes  Dinner: yes  Evening Snack: no    Liquids Consumed: 65-80 oz per day.  Times of day liquids consumed: all day  Patient taking protein supplement: Yes.  Brand of Supplement: Premier  Patient taking multivitamins: Yes  Does patient exercise:  three times a week  What prevents you from exercising: work schedule    Addressed the status of the following co-morbidities:   1. HTN  Resolved.  2. Back pain  improving.  3. OSA Resolved and will return her CPAP machine.      Current Outpatient Prescriptions:   .  buPROPion (WELLBUTRIN XL) 300 MG extended release tablet, Take 1 tablet by mouth every morning, Disp: 90 tablet, Rfl: 1  .  vitamin B-12 (CYANOCOBALAMIN) 500 MCG tablet, Take 500 mcg by mouth daily, Disp: , Rfl:   .  Multiple Vitamins-Minerals (THERAPEUTIC MULTIVITAMIN-MINERALS) tablet, Take 1 tablet by mouth daily, Disp: , Rfl:   .  calcium citrate-vitamin D (CITRICAL + D) 315-250 MG-UNIT TABS per tablet, Take 1 tablet by mouth 2 times daily (with meals), Disp: , Rfl:   .  pantoprazole (PROTONIX) 40 MG tablet, Take 1 tablet by mouth 2 times daily, Disp: 60 tablet, Rfl: 3  .  ondansetron (ZOFRAN ODT) 4 MG disintegrating tablet, Take 1 tablet by mouth every 4 hours  as needed for Nausea or Vomiting, Disp: 30 tablet, Rfl: 3  Past Medical History:   Diagnosis Date   . Anxiety    . Back pain     "Sometimes"   . Depression    . Fatigue    . Morbid obesity (HCC)    . Sleep apnea     Uses CPAP   . Teeth missing     Upper And Lower   . Wears contact lenses     Bilateral    . Wears glasses    . Wisdom teeth extracted     4 Wisdom Teeth Extracted In Past      Past Surgical History:   Procedure Laterality Date   . DENTAL SURGERY      Teeth Extracted In Past   . ENDOSCOPY, COLON, DIAGNOSTIC  11/2016   . HYSTERECTOMY VAGINAL  2012   . LAPAROSCOPY  2011    "Twice"   . SLEEVE GASTRECTOMY  12/30/2016    Robotic   . TONSILLECTOMY AND ADENOIDECTOMY  2014   . WISDOM TOOTH EXTRACTION      4 Wisdom Teeth Extracted In Past     Social History     Social History   . Marital status: Married     Spouse name: N/A   . Number of children: N/A   . 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 Smoking status: Never  Smoker   . Smokeless tobacco: Never Used   . Alcohol use No   . Drug use: No   . Sexual activity: Yes     Partners: Male     Other Topics Concern   . None     Social History Narrative   . None     Review of Systems   Constitutional: Negative for activity change, chills, diaphoresis and fever.   HENT: Negative for sore throat, trouble swallowing and voice change.    Eyes: Negative for photophobia and visual disturbance.   Respiratory: Negative for cough, shortness of breath and wheezing.    Cardiovascular: Negative for chest pain, palpitations and leg swelling.   Gastrointestinal: Negative for abdominal pain, anal bleeding, blood in stool, constipation, diarrhea, nausea and vomiting.   Endocrine: Negative for cold intolerance, heat intolerance, polydipsia and polyuria.   Genitourinary: Negative for dysuria, frequency and hematuria.   Musculoskeletal: Negative for joint swelling, myalgias and neck stiffness.   Skin: Negative for color change and rash.   Neurological: Negative for seizures, speech difficulty, light-headedness and numbness.   Hematological: Negative for adenopathy. Does not bruise/bleed easily.         OBJECTIVE:    Physical Exam   Constitutional: She is oriented to person, place, and time. She appears well-developed and well-nourished. No distress.   HENT:   Head: Normocephalic and atraumatic.   Eyes: Conjunctivae and EOM are normal. Pupils are equal, round, and reactive to light. No scleral icterus.   Neck: Normal range of motion. No JVD present. No tracheal deviation present. No thyromegaly present.   Cardiovascular: Normal rate, regular rhythm, normal heart sounds and intact distal pulses.  Exam reveals no gallop and no friction rub.    No murmur heard.  Pulmonary/Chest: Effort normal. No stridor. No respiratory distress. She has no wheezes. She has no rales. She exhibits no tenderness.   Abdominal: Soft. Bowel sounds are normal. She exhibits no distension and no  mass. There is no tenderness. There  is no rebound and no guarding.   Musculoskeletal: Normal range of motion. She exhibits no edema or tenderness.   Lymphadenopathy:     She has no cervical adenopathy.   Neurological: She is alert and oriented to person, place, and time. No cranial nerve deficit. Coordination normal.   Skin: Skin is warm and dry. No rash noted. She is not diaphoretic. No erythema. No pallor.   Psychiatric: Her behavior is normal. Judgment and thought content normal.   Vitals reviewed.      Assessment / Plan:    1. Status post bariatric surgery          Exercises at [facility] [employer], elliptical.    Follow Up:  Return in about 6 months (around 12/22/2017) for Bariatric follow up: diet, exercise & weight loss, Follow up Symptoms.    Estill BakesMagued Patricia Perales, MD, FACS, FICS  Member of the [employer]    [phone]    06/21/17

## 2017-06-23 ENCOUNTER — Ambulatory Visit
Admit: 2017-06-23 | Discharge: 2017-06-23 | Payer: PRIVATE HEALTH INSURANCE | Attending: Family | Primary: Registered Nurse

## 2017-06-23 DIAGNOSIS — Z23 Encounter for immunization: Secondary | ICD-10-CM

## 2017-06-23 MED ORDER — TETANUS-DIPHTH-ACELL PERTUSSIS 5-2-15.5 LF-MCG/0.5 IM SUSP
5-2-15.5-0.5 LF-MCG/0.5 | Freq: Once | INTRAMUSCULAR | 0 refills | Status: DC
Start: 2017-06-23 — End: 2017-06-23

## 2017-06-23 MED ORDER — BUPROPION HCL ER (XL) 300 MG PO TB24
300 | ORAL_TABLET | Freq: Every morning | ORAL | 1 refills | Status: DC
Start: 2017-06-23 — End: 2017-12-29

## 2017-06-23 MED ORDER — KETOROLAC TROMETHAMINE 60 MG/2ML IM SOLN
60 MG/2ML | Freq: Once | INTRAMUSCULAR | Status: AC
Start: 2017-06-23 — End: 2017-06-23
  Administered 2017-06-23: 15:00:00 60 mg via INTRAMUSCULAR

## 2017-06-23 MED ORDER — TOPIRAMATE 25 MG PO TABS
25 MG | ORAL_TABLET | Freq: Every day | ORAL | 1 refills | Status: DC
Start: 2017-06-23 — End: 2018-05-30

## 2017-06-23 NOTE — Patient Instructions (Signed)
We are committed to providing you the best care possible.    If you receive a survey after visiting one of our offices, please take time to share your experience concerning your physician office visit.  These surveys are confidential and no health information about you is shared.    We are eager to improve for you and we are counting on your feedback to help make that happen.

## 2017-06-23 NOTE — Progress Notes (Signed)
SUBJECTIVE:  Linda Blevins   1981-02-15   female   Allergies   Allergen Reactions   . Ciprofloxacin Hives   . Morphine And Related Nausea And Vomiting   . Pcn [Penicillins] Hives   . Sulfa Antibiotics Hives       Chief Complaint   Patient presents with   . Headache     only able to take tylenol and it is not effective, would like to discuss other options   . Depression      HPI   Patient is here for a recheck appointment of headaches and depression. She states that since her gastric sleeve, she cannot take excedrin for occasional headaches, and the tylenol does not help. She has associated nausea but no photophobia. She has had a persistent headache for the past two weeks that is from behind her right eye extending to the base of her neck.  She reports good benefit from the wellbutrin for her depression.  She is happy with her work and home life.    Past Medical History:   Diagnosis Date   . Anxiety    . Back pain     "Sometimes"   . Depression    . Fatigue    . Morbid obesity (HCC)    . Sleep apnea     Uses CPAP   . Teeth missing     Upper And Lower   . Wears contact lenses     Bilateral    . Wears glasses    . Wisdom teeth extracted     4 Wisdom Teeth Extracted In Past     Social History     Social History   . Marital status: Married     Spouse name: N/A   . Number of children: N/A   . 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 Smoking status: Never Smoker   . Smokeless tobacco: Never Used   . Alcohol use No   . Drug use: No   . Sexual activity: Yes     Partners: Male     Other Topics Concern   . Not on file     Social History Narrative   . No narrative on file     Family History   Problem Relation Age of Onset   . Asthma Mother    . Depression Mother    . High Blood Pressure Mother    . High Blood Pressure Father    . High Cholesterol Father      Past Surgical History:   Procedure Laterality Date   . DENTAL SURGERY      Teeth Extracted In Past    . ENDOSCOPY, COLON, DIAGNOSTIC  11/2016   . HYSTERECTOMY VAGINAL  2012   . LAPAROSCOPY  2011    "Twice"   . SLEEVE GASTRECTOMY  12/30/2016    Robotic   . TONSILLECTOMY AND ADENOIDECTOMY  2014   . WISDOM TOOTH EXTRACTION      4 Wisdom Teeth Extracted In Past       Review of Systems   Constitutional: Negative for fatigue and unexpected weight change.   Respiratory: Negative for shortness of breath.    Cardiovascular: Negative for chest pain, palpitations and leg swelling.   Gastrointestinal: Negative for abdominal distention, nausea and vomiting.   Neurological: Positive for headaches. Negative for dizziness and weakness.   Psychiatric/Behavioral: Positive  for dysphoric mood. Negative for agitation and sleep disturbance. The patient is not nervous/anxious.         Benefits from wellbutrin       OBJECTIVE:  BP 104/70 (Site: Left Arm, Position: Sitting, Cuff Size: Medium Adult)   Pulse 62   Resp 17   Ht 5\' 2"  (1.575 m)   Wt 189 lb (85.7 kg)   SpO2 99%   BMI 34.57 kg/m   BP Readings from Last 3 Encounters:   06/23/17 104/70   06/21/17 122/71   04/19/17 116/82     Wt Readings from Last 3 Encounters:   06/23/17 189 lb (85.7 kg)   06/21/17 190 lb 11.2 oz (86.5 kg)   04/19/17 203 lb 6.4 oz (92.3 kg)     Body mass index is 34.57 kg/m.  Physical Exam   Constitutional: She is oriented to person, place, and time. She appears well-developed. No distress.   Obese with a BMI of 34.57   HENT:   Head: Normocephalic and atraumatic.   Nose: Nose normal.   Mouth/Throat: Oropharynx is clear and moist.   Eyes: Conjunctivae are normal. Pupils are equal, round, and reactive to light.   Neck: Normal range of motion. Neck supple.   Cardiovascular: Normal rate, regular rhythm, normal heart sounds and intact distal pulses.    Pulmonary/Chest: Effort normal and breath sounds normal.   Abdominal: Soft. Bowel sounds are normal.   Musculoskeletal: Normal range of motion.   Neurological: She is alert and oriented to person, place, and  time.   Skin: Skin is warm and dry.   Psychiatric: She has a normal mood and affect. Her behavior is normal. Judgment and thought content normal.   Nursing note and vitals reviewed.    ASSESSMENT/PLAN:    1. Intractable episodic headache, unspecified headache type  - ketorolac (TORADOL) injection 60 mg; Inject 2 mLs into the muscle once  - topiramate (TOPAMAX) 25 MG tablet; Take 1 tablet by mouth daily  Dispense: 90 tablet; Refill: 1    2. Need for Tdap vaccination  - Tdap (age 4610y-64y) IM (ADACEL)    3. Dysthymia  Refill:  - buPROPion (WELLBUTRIN XL) 300 MG extended release tablet; Take 1 tablet by mouth every morning  Dispense: 90 tablet; Refill: 1      Orders Placed This Encounter   Procedures   . Tdap (age 7010y-64y) IM (ADACEL)     Current Outpatient Prescriptions   Medication Sig Dispense Refill   . topiramate (TOPAMAX) 25 MG tablet Take 1 tablet by mouth daily 90 tablet 1   . buPROPion (WELLBUTRIN XL) 300 MG extended release tablet Take 1 tablet by mouth every morning 90 tablet 1   . vitamin B-12 (CYANOCOBALAMIN) 500 MCG tablet Take 500 mcg by mouth daily     . Multiple Vitamins-Minerals (THERAPEUTIC MULTIVITAMIN-MINERALS) tablet Take 1 tablet by mouth daily     . calcium citrate-vitamin D (CITRICAL + D) 315-250 MG-UNIT TABS per tablet Take 1 tablet by mouth 2 times daily (with meals)     . pantoprazole (PROTONIX) 40 MG tablet Take 1 tablet by mouth 2 times daily 60 tablet 3   . ondansetron (ZOFRAN ODT) 4 MG disintegrating tablet Take 1 tablet by mouth every 4 hours as needed for Nausea or Vomiting 30 tablet 3     No current facility-administered medications for this visit.        Return in about 6 months (around 12/24/2017) for depression, headaches.    Mauricia AreaSue A  Larey Brick, FNP-C    Return for new or worsening symptoms or any concerns as needed.

## 2017-06-23 NOTE — Progress Notes (Signed)
Toradol injection ventrogluteal right, patient toletrated well.   NDC [id number]  Lot [id number]  Exp 09/21/18

## 2017-07-14 ENCOUNTER — Ambulatory Visit: Payer: Medicaid Other | Admitting: Family Medicine

## 2017-09-20 ENCOUNTER — Encounter: Payer: Self-pay | Admitting: Family Medicine

## 2017-09-22 ENCOUNTER — Encounter: Payer: Medicaid Other | Admitting: Family Medicine

## 2017-09-27 ENCOUNTER — Encounter: Payer: Self-pay | Admitting: Family Medicine

## 2017-09-27 ENCOUNTER — Ambulatory Visit (INDEPENDENT_AMBULATORY_CARE_PROVIDER_SITE_OTHER): Payer: Medicaid Other | Admitting: Family Medicine

## 2017-09-27 VITALS — BP 122/77 | HR 108 | Ht 63.78 in | Wt 222.0 lb

## 2017-09-27 DIAGNOSIS — F419 Anxiety disorder, unspecified: Secondary | ICD-10-CM

## 2017-09-27 DIAGNOSIS — Z0000 Encounter for general adult medical examination without abnormal findings: Secondary | ICD-10-CM

## 2017-09-27 DIAGNOSIS — Z01419 Encounter for gynecological examination (general) (routine) without abnormal findings: Secondary | ICD-10-CM

## 2017-09-27 MED ORDER — HYDROCHLOROTHIAZIDE 12.5 MG PO TABS: 12.5000 mg | ORAL_TABLET | Freq: Every day | ORAL | 1 refills | Status: DC

## 2017-09-27 NOTE — Progress Notes (Signed)
BP 122/77   Pulse (!) 108   Ht 5' 3.78" (1.62 m)   Wt 222 lb (100.7 kg)   SpO2 98%   BMI 38.37 kg/m    Subjective:    Patient ID: Martha Lawson, female    DOB: 05/13/1981, 36 y.o.   MRN: [id number]  HPI: Martha Lawson is a 36 y.o. female presenting on 09/27/2017 for comprehensive medical examination. Current medical complaints include:see below  Was on xanax for about 2 years. States multiple times that she has never tried any other medications for this. Has also seen counselors in the past. Has been off all therapies for about 1.5 years. Feels like since then things are progressively worsening and it is starting to affect daily life. Generalized worrying all the time, no specified triggers.   Upon specific questioning about some possible options, pt states she has tried buspar which made her sick, klonopin which didn't seem to help. Has tried prozac with no relief.   Depression Screen done today and results listed below:  [facility] 09/27/2017 08/29/2016  Decreased Interest 0 0  Down, Depressed, Hopeless 0 0  PHQ - 2 Score 0 0  Altered sleeping - 0  Tired, decreased energy - 1  Change in appetite - 0  Feeling bad or failure about yourself  - 0  Trouble concentrating - 1  Moving slowly or fidgety/restless - 0  Suicidal thoughts - 0  PHQ-9 Score - 2    The patient does not have a history of falls. I did not complete a risk assessment for falls. A plan of care for falls was not documented.   Past Medical History:  Past Medical History:  Diagnosis Date  . Anoxic brain injury (HCC) 07/2016  . Anoxic encephalopathy (HCC)   . Asthma    in fall due to allergies  . History of MRSA infection 07/2016   in patient's trach  . History of substance abuse     Surgical History:  Past Surgical History:  Procedure Laterality Date  . TUBAL LIGATION      Medications:  Current Outpatient Medications on File Prior to Visit  Medication Sig  . phentermine 37.5 MG  capsule Take 37.5 mg by mouth every morning.  . predniSONE (DELTASONE) 10 MG tablet Take 6 tabs x 2 days, then 5 tabs x 2 days, then 4 tabs x 2 days, etc  . triamcinolone cream (KENALOG) 0.1 % Apply 1 application topically 2 (two) times daily.   No current facility-administered medications on file prior to visit.     Allergies:  Allergies  Allergen Reactions  . Aspirin     Social History:  Social History   Socioeconomic History  . Marital status: Single    Spouse name: Not on file  . Number of children: Not on file  . Years of education: Not on file  . Highest education level: Not on file  Social Needs  . Financial resource strain: Not on file  . Food insecurity - worry: Not on file  . Food insecurity - inability: Not on file  . Transportation needs - medical: Not on file  . Transportation needs - non-medical: Not on file  Occupational History  . Not on file  Tobacco Use  . Smoking status: Current Every Day Smoker    Packs/day: 0.50    Types: Cigarettes  . Smokeless tobacco: Former Engineer, waterUser  Substance and Sexual Activity  . Alcohol use: No  . Drug use: No  Comment: former user  . Sexual activity: Yes  Other Topics Concern  . Not on file  Social History Narrative  . Not on file   Social History   Tobacco Use  Smoking Status Current Every Day Smoker  . Packs/day: 0.50  . Types: Cigarettes  Smokeless Tobacco Former NeurosurgeonUser   Social History   Substance and Sexual Activity  Alcohol Use No    Family History:  Family History  Problem Relation Age of Onset  . High blood pressure Mother   . Diabetes Mother   . Stroke Mother   . COPD Mother   . High blood pressure Father   . AAA (abdominal aortic aneurysm) Father   . COPD Maternal Uncle   . Heart disease Maternal Grandmother   . Cancer Neg Hx     Past medical history, surgical history, medications, allergies, family history and social history reviewed with patient today and changes made to appropriate areas  of the chart.   Review of Systems - General ROS: negative Psychological ROS: positive for - anxiety Ophthalmic ROS: negative ENT ROS: negative Breast ROS: negative for breast lumps Respiratory ROS: no cough, shortness of breath, or wheezing Cardiovascular ROS: no chest pain or dyspnea on exertion Gastrointestinal ROS: no abdominal pain, change in bowel habits, or black or bloody stools Genito-Urinary ROS: no dysuria, trouble voiding, or hematuria Musculoskeletal ROS: negative Neurological ROS: no TIA or stroke symptoms Dermatological ROS: negative All other ROS negative except what is listed above and in the HPI.      Objective:    BP 122/77   Pulse (!) 108   Ht 5' 3.78" (1.62 m)   Wt 222 lb (100.7 kg)   SpO2 98%   BMI 38.37 kg/m   Wt Readings from Last 3 Encounters:  09/27/17 222 lb (100.7 kg)  06/14/17 211 lb (95.7 kg)  08/12/16 228 lb 12.8 oz (103.8 kg)    Physical Exam  Constitutional: She is oriented to person, place, and time. She appears well-developed and well-nourished. No distress.  HENT:  Head: Atraumatic.  Right Ear: External ear normal.  Left Ear: External ear normal.  Nose: Nose normal.  Mouth/Throat: Oropharynx is clear and moist. No oropharyngeal exudate.  Eyes: Conjunctivae are normal. Pupils are equal, round, and reactive to light. No scleral icterus.  Neck: Normal range of motion. Neck supple. No thyromegaly present.  Cardiovascular: Normal rate, regular rhythm, normal heart sounds and intact distal pulses.  Pulmonary/Chest: Effort normal and breath sounds normal. No respiratory distress.  Abdominal: Soft. Bowel sounds are normal. She exhibits no mass. There is no tenderness.  Musculoskeletal: Normal range of motion. She exhibits no edema or tenderness.  Lymphadenopathy:    She has no cervical adenopathy.  Neurological: She is alert and oriented to person, place, and time. No cranial nerve deficit.  Skin: Skin is warm and dry. No rash noted.    Psychiatric:  Appears very nervous, fidgeting during discussion  Nursing note and vitals reviewed.   Results for orders placed or performed in visit on 09/27/17  Microscopic Examination  Result Value Ref Range   WBC, UA >30 (A) 0 - 5 /hpf   RBC, UA >30 (A) 0 - 2 /hpf   Epithelial Cells (non renal) 0-10 0 - 10 /hpf   Renal Epithel, UA None seen None seen /hpf   Casts None seen None seen /lpf   Cast Type None seen N/A   Crystals None seen N/A   Crystal Type None seen N/A  Mucus, UA None seen Not Estab.   Bacteria, UA Many (A) None seen/Few   Yeast, UA None seen None seen   Trichomonas, UA None seen None seen  Urine Culture, Reflex  Result Value Ref Range   Urine Culture, Routine WILL FOLLOW   CBC with Differential/Platelet  Result Value Ref Range   WBC 6.8 3.4 - 10.8 x10E3/uL   RBC 3.90 3.77 - 5.28 x10E6/uL   Hemoglobin 11.4 11.1 - 15.9 g/dL   Hematocrit 09.8 11.9 - 46.6 %   MCV 91 79 - 97 fL   MCH 29.2 26.6 - 33.0 pg   MCHC 32.3 31.5 - 35.7 g/dL   RDW 14.7 82.9 - 56.2 %   Platelets 264 150 - 379 x10E3/uL   Neutrophils 67 Not Estab. %   Lymphs 23 Not Estab. %   Monocytes 8 Not Estab. %   Eos 2 Not Estab. %   Basos 0 Not Estab. %   Neutrophils Absolute 4.6 1.4 - 7.0 x10E3/uL   Lymphocytes Absolute 1.5 0.7 - 3.1 x10E3/uL   Monocytes Absolute 0.6 0.1 - 0.9 x10E3/uL   EOS (ABSOLUTE) 0.1 0.0 - 0.4 x10E3/uL   Basophils Absolute 0.0 0.0 - 0.2 x10E3/uL   Immature Granulocytes 0 Not Estab. %   Immature Grans (Abs) 0.0 0.0 - 0.1 x10E3/uL  Comprehensive metabolic panel  Result Value Ref Range   Glucose 116 (H) 65 - 99 mg/dL   BUN 10 6 - 20 mg/dL   Creatinine, Ser 1.30 0.57 - 1.00 mg/dL   GFR calc non Af Amer 103 >59 mL/min/1.73   GFR calc Af Amer 118 >59 mL/min/1.73   BUN/Creatinine Ratio 13 9 - 23   Sodium 142 134 - 144 mmol/L   Potassium 3.7 3.5 - 5.2 mmol/L   Chloride 103 96 - 106 mmol/L   CO2 26 20 - 29 mmol/L   Calcium 8.7 8.7 - 10.2 mg/dL   Total Protein 6.6 6.0  - 8.5 g/dL   Albumin 3.8 3.5 - 5.5 g/dL   Globulin, Total 2.8 1.5 - 4.5 g/dL   Albumin/Globulin Ratio 1.4 1.2 - 2.2   Bilirubin Total 0.4 0.0 - 1.2 mg/dL   Alkaline Phosphatase 40 39 - 117 IU/L   AST 14 0 - 40 IU/L   ALT 16 0 - 32 IU/L  Lipid Panel w/o Chol/HDL Ratio  Result Value Ref Range   Cholesterol, Total 131 100 - 199 mg/dL   Triglycerides 94 0 - 149 mg/dL   HDL 35 (L) >86 mg/dL   VLDL Cholesterol Cal 19 5 - 40 mg/dL   LDL Calculated 77 0 - 99 mg/dL  UA/M w/rflx Culture, Routine  Result Value Ref Range   Specific Gravity, UA 1.025 1.005 - 1.030   pH, UA 6.0 5.0 - 7.5   Color, UA Yellow Yellow   Appearance Ur Cloudy (A) Clear   Leukocytes, UA 2+ (A) Negative   Protein, UA 1+ (A) Negative/Trace   Glucose, UA Negative Negative   Ketones, UA Negative Negative   RBC, UA Negative Negative   Bilirubin, UA Negative Negative   Urobilinogen, Ur 0.2 0.2 - 1.0 mg/dL   Nitrite, UA Positive (A) Negative   Microscopic Examination See below:    Urinalysis Reflex Comment   TSH  Result Value Ref Range   TSH 0.174 (L) 0.450 - 4.500 uIU/mL      Assessment & Plan:   Problem List Items Addressed This Visit      Other   Anxiety -  Primary    Pt declines every treatment besides xanax at this time. Discussed with her that especially given her fairly recent hx of substance abuse with OD and anoxic brain injury, I am not comfortable starting her back on controlled substances. Pt agreeable to seeing Psychiatry for further evaluation and treatment.       Relevant Orders   Ambulatory referral to Psychiatry    Other Visit Diagnoses    Annual physical exam       Relevant Orders   CBC with Differential/Platelet (Completed)   Comprehensive metabolic panel (Completed)   Lipid Panel w/o Chol/HDL Ratio (Completed)   UA/M w/rflx Culture, Routine (Completed)   TSH (Completed)       Follow up plan: Return in about 1 year (around 09/27/2018) for CPE.   LABORATORY TESTING:  - Pap smear: up  to date  IMMUNIZATIONS:   - Tdap: Tetanus vaccination status reviewed: postponed. - Influenza: Refused  PATIENT COUNSELING:   Advised to take 1 mg of folate supplement per day if capable of pregnancy.   Sexuality: Discussed sexually transmitted diseases, partner selection, use of condoms, avoidance of unintended pregnancy  and contraceptive alternatives.   Advised to avoid cigarette smoking.  I discussed with the patient that most people either abstain from alcohol or drink within safe limits (<=14/week and <=4 drinks/occasion for males, <=7/weeks and <= 3 drinks/occasion for females) and that the risk for alcohol disorders and other health effects rises proportionally with the number of drinks per week and how often a drinker exceeds daily limits.  Discussed cessation/primary prevention of drug use and availability of treatment for abuse.   Diet: Encouraged to adjust caloric intake to maintain  or achieve ideal body weight, to reduce intake of dietary saturated fat and total fat, to limit sodium intake by avoiding high sodium foods and not adding table salt, and to maintain adequate dietary potassium and calcium preferably from fresh fruits, vegetables, and low-fat dairy products.    stressed the importance of regular exercise  Injury prevention: Discussed safety belts, safety helmets, smoke detector, smoking near bedding or upholstery.   Dental health: Discussed importance of regular tooth brushing, flossing, and dental visits.    NEXT PREVENTATIVE PHYSICAL DUE IN 1 YEAR. Return in about 1 year (around 09/27/2018) for CPE.

## 2017-09-28 ENCOUNTER — Other Ambulatory Visit: Payer: Self-pay | Admitting: Family Medicine

## 2017-09-28 DIAGNOSIS — R7989 Other specified abnormal findings of blood chemistry: Secondary | ICD-10-CM

## 2017-09-28 LAB — COMPREHENSIVE METABOLIC PANEL
ALBUMIN: 3.8 g/dL (ref 3.5–5.5)
ALK PHOS: 40 IU/L (ref 39–117)
ALT: 16 IU/L (ref 0–32)
AST: 14 IU/L (ref 0–40)
Albumin/Globulin Ratio: 1.4 (ref 1.2–2.2)
BILIRUBIN TOTAL: 0.4 mg/dL (ref 0.0–1.2)
BUN / CREAT RATIO: 13 (ref 9–23)
BUN: 10 mg/dL (ref 6–20)
CHLORIDE: 103 mmol/L (ref 96–106)
CO2: 26 mmol/L (ref 20–29)
Calcium: 8.7 mg/dL (ref 8.7–10.2)
Creatinine, Ser: 0.76 mg/dL (ref 0.57–1.00)
GFR calc Af Amer: 118 mL/min/{1.73_m2} (ref >59)
GFR calc non Af Amer: 103 mL/min/{1.73_m2} (ref >59)
GLOBULIN, TOTAL: 2.8 g/dL (ref 1.5–4.5)
GLUCOSE: 116 mg/dL — AB (ref 65–99)
Potassium: 3.7 mmol/L (ref 3.5–5.2)
SODIUM: 142 mmol/L (ref 134–144)
Total Protein: 6.6 g/dL (ref 6.0–8.5)

## 2017-09-28 LAB — CBC WITH DIFFERENTIAL/PLATELET
BASOS ABS: 0 10*3/uL (ref 0.0–0.2)
Basos: 0 %
EOS (ABSOLUTE): 0.1 10*3/uL (ref 0.0–0.4)
Eos: 2 %
HEMOGLOBIN: 11.4 g/dL (ref 11.1–15.9)
Hematocrit: 35.3 % (ref 34.0–46.6)
Immature Grans (Abs): 0 10*3/uL (ref 0.0–0.1)
Immature Granulocytes: 0 %
LYMPHS ABS: 1.5 10*3/uL (ref 0.7–3.1)
Lymphs: 23 %
MCH: 29.2 pg (ref 26.6–33.0)
MCHC: 32.3 g/dL (ref 31.5–35.7)
MCV: 91 fL (ref 79–97)
MONOCYTES: 8 %
MONOS ABS: 0.6 10*3/uL (ref 0.1–0.9)
NEUTROS ABS: 4.6 10*3/uL (ref 1.4–7.0)
Neutrophils: 67 %
Platelets: 264 10*3/uL (ref 150–379)
RBC: 3.9 x10E6/uL (ref 3.77–5.28)
RDW: 13.7 % (ref 12.3–15.4)
WBC: 6.8 10*3/uL (ref 3.4–10.8)

## 2017-09-28 LAB — LIPID PANEL W/O CHOL/HDL RATIO
CHOLESTEROL TOTAL: 131 mg/dL (ref 100–199)
HDL: 35 mg/dL — ABNORMAL LOW (ref >39)
LDL Calculated: 77 mg/dL (ref 0–99)
Triglycerides: 94 mg/dL (ref 0–149)
VLDL Cholesterol Cal: 19 mg/dL (ref 5–40)

## 2017-09-28 LAB — TSH: TSH: 0.174 u[IU]/mL — ABNORMAL LOW (ref 0.450–4.500)

## 2017-09-28 MED ORDER — SULFAMETHOXAZOLE-TRIMETHOPRIM 800-160 MG PO TABS: 1.0000 | ORAL_TABLET | Freq: Two times a day (BID) | ORAL | 0 refills | Status: DC

## 2017-09-28 NOTE — Progress Notes (Signed)
Antibiotic sent for UTI.

## 2017-09-29 DIAGNOSIS — F419 Anxiety disorder, unspecified: Secondary | ICD-10-CM | POA: Insufficient documentation

## 2017-09-29 NOTE — Assessment & Plan Note (Signed)
Pt declines every treatment besides xanax at this time. Discussed with her that especially given her fairly recent hx of substance abuse with OD and anoxic brain injury, I am not comfortable starting her back on controlled substances. Pt agreeable to seeing Psychiatry for further evaluation and treatment.

## 2017-09-29 NOTE — Patient Instructions (Signed)
Follow up in 1 year for CPE 

## 2017-10-10 LAB — MICROSCOPIC EXAMINATION
CAST TYPE: NONE SEEN
CRYSTALS: NONE SEEN
Casts: NONE SEEN /lpf
Crystal Type: NONE SEEN
Mucus, UA: NONE SEEN
RBC, UA: >30 /hpf — AB (ref 0–?)
RENAL EPITHEL UA: NONE SEEN /HPF
Trichomonas, UA: NONE SEEN
WBC, UA: >30 /hpf — AB (ref 0–?)
YEAST UA: NONE SEEN

## 2017-10-10 LAB — UA/M W/RFLX CULTURE, ROUTINE
BILIRUBIN UA: NEGATIVE
GLUCOSE, UA: NEGATIVE
KETONES UA: NEGATIVE
Nitrite, UA: POSITIVE — AB
RBC, UA: NEGATIVE
SPEC GRAV UA: 1.025 (ref 1.005–1.030)
Urobilinogen, Ur: 0.2 mg/dL (ref 0.2–1.0)
pH, UA: 6 (ref 5.0–7.5)

## 2017-10-10 LAB — URINE CULTURE, REFLEX

## 2017-11-09 ENCOUNTER — Other Ambulatory Visit: Payer: Self-pay

## 2017-11-30 ENCOUNTER — Inpatient Hospital Stay: Payer: PRIVATE HEALTH INSURANCE | Primary: Registered Nurse

## 2017-11-30 DIAGNOSIS — Z9884 Bariatric surgery status: Secondary | ICD-10-CM

## 2017-11-30 LAB — TSH: TSH, High Sensitivity: 3.27 u[IU]/mL (ref 0.270–4.20)

## 2017-11-30 LAB — CBC WITH AUTO DIFFERENTIAL
Basophils %: 0.8 % (ref 0–1)
Basophils Absolute: 0 10*3/uL
Eosinophils %: 3.1 % — ABNORMAL HIGH (ref 0–3)
Eosinophils Absolute: 0.2 10*3/uL
Hematocrit: 37.4 % (ref 37–47)
Hemoglobin: 12.2 GM/DL — ABNORMAL LOW (ref 12.5–16.0)
Immature Neutrophil %: 0.2 % (ref 0–0.43)
Lymphocytes %: 29.9 % (ref 24–44)
Lymphocytes Absolute: 1.5 10*3/uL
MCH: 31 PG (ref 27–31)
MCHC: 32.6 % (ref 32.0–36.0)
MCV: 94.9 FL (ref 78–100)
MPV: 10.2 FL (ref 7.5–11.1)
Monocytes %: 7.4 % — ABNORMAL HIGH (ref 0–4)
Monocytes Absolute: 0.4 10*3/uL
Neutrophils Absolute: 3 10*3/uL
Nucleated RBC %: 0 %
Platelets: 261 10*3/uL (ref 140–440)
RBC: 3.94 10*6/uL — ABNORMAL LOW (ref 4.2–5.4)
RDW: 12.1 % (ref 11.7–14.9)
Segs Relative: 58.6 % (ref 36–66)
Total Immature Neutrophil: 0.01 10*3/uL
Total Nucleated RBC: 0 10*3/uL
WBC: 5.2 10*3/uL (ref 4.0–10.5)

## 2017-11-30 LAB — VITAMIN B12 & FOLATE
Folate: >20 NG/ML — ABNORMAL HIGH (ref 3.1–17.5)
Vitamin B-12: 880.9 pg/ml (ref 211–911)

## 2017-11-30 LAB — LIPID PANEL
Cholesterol: 159 MG/DL (ref <200)
HDL: 44 MG/DL (ref >40)
LDL Direct: 114 MG/DL — ABNORMAL HIGH (ref <100)
Triglycerides: 78 MG/DL (ref <150)

## 2017-11-30 LAB — BRAIN NATRIURETIC PEPTIDE: Pro-BNP: 78.65 PG/ML (ref <300)

## 2017-12-03 LAB — VITAMIN D 1,25 DIHYDROXY: Vit D, 1,25-Dihydroxy: 50.5

## 2017-12-29 ENCOUNTER — Ambulatory Visit
Admit: 2017-12-29 | Discharge: 2017-12-29 | Payer: PRIVATE HEALTH INSURANCE | Attending: Family | Primary: Registered Nurse

## 2017-12-29 ENCOUNTER — Ambulatory Visit
Admit: 2017-12-29 | Discharge: 2017-12-29 | Payer: PRIVATE HEALTH INSURANCE | Attending: Surgery | Primary: Registered Nurse

## 2017-12-29 DIAGNOSIS — Z9884 Bariatric surgery status: Secondary | ICD-10-CM

## 2017-12-29 DIAGNOSIS — F341 Dysthymic disorder: Secondary | ICD-10-CM

## 2017-12-29 MED ORDER — BUPROPION HCL ER (XL) 300 MG PO TB24
300 | ORAL_TABLET | Freq: Every morning | ORAL | 1 refills | Status: DC
Start: 2017-12-29 — End: 2018-07-16

## 2017-12-29 MED ORDER — FERROUS SULFATE 325 (65 FE) MG PO TABS
325 (65 Fe) MG | ORAL_TABLET | Freq: Every day | ORAL | 5 refills | Status: DC
Start: 2017-12-29 — End: 2018-07-30

## 2017-12-29 NOTE — Progress Notes (Signed)
SUBJECTIVE:  Linda Blevins   1981/08/08   female   Allergies   Allergen Reactions   . Ciprofloxacin Hives   . Morphine And Related Nausea And Vomiting   . Pcn [Penicillins] Hives   . Sulfa Antibiotics Hives       Chief Complaint   Patient presents with   . Depression          HPI   Patient here today for recheck of chronic depression. She reports good benefit with her current dosing of wellbutrin. 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 She follows with Dr. Ward Givens s/p gastic sleeve and has no complaints.    Past Medical History:   Diagnosis Date   . Anxiety    . Back pain     "Sometimes"   . Depression    . Fatigue    . Morbid obesity (HCC)    . Sleep apnea     Uses CPAP   . Teeth missing     Upper And Lower   . Wears contact lenses     Bilateral    . Wears glasses    . Wisdom teeth extracted     4 Wisdom Teeth Extracted In Past     Social History     Social History   . Marital status: Married     Spouse name: N/A   . Number of children: N/A   . Years of education: 48     Occupational History   . RN [employer]     Social History Main Topics   . Smoking status: Never Smoker   . Smokeless tobacco: Never Used   . Alcohol use No   . Drug use: No   . Sexual activity: Yes     Partners: Male     Birth control/ protection: Surgical     Other Topics Concern   . Not on file     Social History Narrative   . No narrative on file     Family History   Problem Relation Age of Onset   . Asthma Mother    . Depression Mother    . High Blood Pressure Mother    . High Blood Pressure Father    . High Cholesterol Father      Past Surgical History:   Procedure Laterality Date   . DENTAL SURGERY      Teeth Extracted In Past   . ENDOSCOPY, COLON, DIAGNOSTIC  11/2016   . HYSTERECTOMY VAGINAL  2012   . LAPAROSCOPY  2011    "Twice"   . SLEEVE GASTRECTOMY  12/30/2016    Robotic   . TONSILLECTOMY AND ADENOIDECTOMY  2014   . WISDOM TOOTH EXTRACTION      4 Wisdom Teeth Extracted In Past       Review of Systems    Constitutional: Negative for fatigue and unexpected weight change.   Respiratory: Negative for shortness of breath.    Cardiovascular: Negative for chest pain, palpitations and leg swelling.   Gastrointestinal: Negative for abdominal distention, nausea and vomiting.   Musculoskeletal: Negative for back pain and gait problem.   Neurological: Negative for dizziness, weakness and headaches.   Psychiatric/Behavioral: Positive for dysphoric mood. Negative for agitation, self-injury, sleep disturbance and suicidal ideas. The patient is not nervous/anxious.        OBJECTIVE:  BP 112/62 (Site: Left Upper Arm, Position: Sitting, Cuff Size: Large Adult)   Pulse 71   Resp 16  Ht 5\' 2"  (1.575 m)   Wt 173 lb (78.5 kg)   SpO2 98%   BMI 31.64 kg/m   BP Readings from Last 3 Encounters:   12/29/17 112/62   12/29/17 115/64   06/23/17 104/70     Wt Readings from Last 3 Encounters:   12/29/17 173 lb (78.5 kg)   12/29/17 172 lb 1.6 oz (78.1 kg)   06/23/17 189 lb (85.7 kg)     Body mass index is 31.64 kg/m.  Physical Exam   Constitutional: She is oriented to person, place, and time. She appears well-developed.   Mildly obese with a BMI of 31.64   HENT:   Head: Normocephalic and atraumatic.   Right Ear: External ear normal.   Left Ear: External ear normal.   Nose: Nose normal.   Mouth/Throat: Oropharynx is clear and moist.   Eyes: Pupils are equal, round, and reactive to light. Conjunctivae and EOM are normal.   Neck: Normal range of motion. Neck supple.   Cardiovascular: Normal rate, regular rhythm, normal heart sounds and intact distal pulses.    Pulmonary/Chest: Effort normal and breath sounds normal.   Abdominal: Soft. Bowel sounds are normal.   Musculoskeletal: Normal range of motion.   Neurological: She is alert and oriented to person, place, and time. She has normal reflexes.   Skin: Skin is warm and dry.   Psychiatric: She has a normal mood and affect. Her behavior is normal. Judgment and thought content normal.    Nursing note and vitals reviewed.      ASSESSMENT/PLAN:    1. Dysthymia  Doing well with current medication management  Refill:  - buPROPion (WELLBUTRIN XL) 300 MG extended release tablet; Take 1 tablet by mouth every morning  Dispense: 90 tablet; Refill: 1  Healthy diet  Routine exercise as tolerated  Return in 6 months or sooner if needed for any concerns      No orders of the defined types were placed in this encounter.    Current Outpatient Prescriptions   Medication Sig Dispense Refill   . docusate sodium (COLACE) 100 MG capsule Take 100 mg by mouth daily     . ferrous sulfate 325 (65 Fe) MG tablet Take 1 tablet by mouth daily (with breakfast) 30 tablet 5   . buPROPion (WELLBUTRIN XL) 300 MG extended release tablet Take 1 tablet by mouth every morning 90 tablet 1   . vitamin B-12 (CYANOCOBALAMIN) 500 MCG tablet Take 500 mcg by mouth daily     . Multiple Vitamins-Minerals (THERAPEUTIC MULTIVITAMIN-MINERALS) tablet Take 1 tablet by mouth daily     . calcium citrate-vitamin D (CITRICAL + D) 315-250 MG-UNIT TABS per tablet Take 1 tablet by mouth 2 times daily (with meals)     . ondansetron (ZOFRAN ODT) 4 MG disintegrating tablet Take 1 tablet by mouth every 4 hours as needed for Nausea or Vomiting 30 tablet 3   . topiramate (TOPAMAX) 25 MG tablet Take 1 tablet by mouth daily 90 tablet 1   . pantoprazole (PROTONIX) 40 MG tablet Take 1 tablet by mouth 2 times daily 60 tablet 3     No current facility-administered medications for this visit.        Return in about 6 months (around 06/28/2018) for depression.    Gala MurdochSue A Lloyde Ludlam DNP, FNP-C    Return for new or worsening symptoms or any concerns as needed.

## 2017-12-29 NOTE — Progress Notes (Signed)
Patient is here for their seventh, follow up 1 year post op 12/30/16.    Date of Surgery: 12/30/16    Type of Surgery:   Laparoscopic robotic DaVinci-assisted sleeve gastrectomy around a 38-French bougie.    BMI:  Obesity Classification: Class I  Today's weight is 172.1 lbs, last visits weight was 190.7  Wt Readings from Last 3 Encounters:   12/29/17 172 lb 1.6 oz (78.1 kg)   06/23/17 189 lb (85.7 kg)   06/21/17 190 lb 11.2 oz (86.5 kg)     Total gain/loss is -18.6 lbs    Weight History:   Wt Readings from Last 3 Encounters:   12/29/17 172 lb 1.6 oz (78.1 kg)   06/23/17 189 lb (85.7 kg)   06/21/17 190 lb 11.2 oz (86.5 kg)       Total weight loss/gain -68.6 Lbs over 1 year.          How pleased are you with your current weight loss: Happy  If you are disappointed, what do you think is preventing you from increased weight loss?   Is patient experiencing any physical problems post surgery: No:   Is patient experiencing any dietary problems post surgery: No:   Food Intolerances: Denies any food intolerances since last seen.  How hungry are you? Somewhat  How full do you feel after eating? content  How fast do you eat? 15 min  Food log brought to appointment today: No    Breakfast: yes  Mid-AM Snack: yes  Lunch: yes  Mid-PM Snack: yes  Dinner: yes  Evening Snack: yes    Liquids Consumed: 60-70 oz per day.  Times of day liquids consumed: throughout  Patient taking protein supplement: Yes.  Brand of Supplement: premier  Patient taking multivitamins: Yes  Does patient exercise: three times a week  What prevents you from exercising: nothing

## 2017-12-29 NOTE — Progress Notes (Signed)
BARIATRIC SURGERY POST OPERATIVE NOTE    SUBJECTIVE:    Patient presenting today referred from [name], APRN - CNP, for   Chief Complaint   Patient presents with   . Follow-up     S/P 89YR Sleeve/61fb @[facility]  12/30/16     BP 115/64 (Site: Left Upper Arm, Position: Sitting, Cuff Size: Medium Adult)   Pulse 67   Resp 16   Ht 5\' 2"  (1.575 m)   Wt 172 lb 1.6 oz (78.1 kg)   BMI 31.48 kg/m      HPI: Linda Blevins is a 37 y.o. female Patient is here for their seventh, follow up 1 year post op 12/30/16.    Date of Surgery: 12/30/16    Type of Surgery:   Laparoscopic robotic DaVinci-assisted sleeve gastrectomy around a 38-French bougie.    BMI:  Obesity Classification: Class I  Today's weight is 172.1 lbs, last visits weight was 190.7      Wt Readings from Last 3 Encounters:   12/29/17 172 lb 1.6 oz (78.1 kg)   06/23/17 189 lb (85.7 kg)   06/21/17 190 lb 11.2 oz (86.5 kg)     Total gain/loss is -18.6 lbs    Weight History:       Wt Readings from Last 3 Encounters:   12/29/17 172 lb 1.6 oz (78.1 kg)   06/23/17 189 lb (85.7 kg)   06/21/17 190 lb 11.2 oz (86.5 kg)       Total weight loss/gain -68.6 Lbs over 1 year.          How pleased are you with your current weight loss: Happy  If you are disappointed, what do you think is preventing you from increased weight loss?   Is patient experiencing any physical problems post surgery: No:   Is patient experiencing any dietary problems post surgery: No:   Food Intolerances: Denies any food intolerances since last seen.  How hungry are you? Somewhat  How full do you feel after eating? content  How fast do you eat? 15 min  Food log brought to appointment today: No    Breakfast: yes  Mid-AM Snack: yes  Lunch: yes  Mid-PM Snack: yes  Dinner: yes  Evening Snack: yes    Liquids Consumed: 60-70 oz per day.  Times of day liquids consumed: throughout  Patient taking protein supplement: Yes.  Brand of Supplement: premier  Patient taking multivitamins: Yes  Does patient  exercise: three times a week  What prevents you from exercising: nothing    Addressed the status of the following co-morbidities:   1. SUI  Improved significantly.  2. OSA  RESOLVED - OFF CPAP.  3. Arthritis improving.      Current Outpatient Prescriptions:   .  docusate sodium (COLACE) 100 MG capsule, Take 100 mg by mouth daily, Disp: , Rfl:   .  ferrous sulfate 325 (65 Fe) MG tablet, Take 1 tablet by mouth daily (with breakfast), Disp: 30 tablet, Rfl: 5  .  buPROPion (WELLBUTRIN XL) 300 MG extended release tablet, Take 1 tablet by mouth every morning, Disp: 90 tablet, Rfl: 1  .  vitamin B-12 (CYANOCOBALAMIN) 500 MCG tablet, Take 500 mcg by mouth daily, Disp: , Rfl:   .  Multiple Vitamins-Minerals (THERAPEUTIC MULTIVITAMIN-MINERALS) tablet, Take 1 tablet by mouth daily, Disp: , Rfl:   .  calcium citrate-vitamin D (CITRICAL + D) 315-250 MG-UNIT TABS per tablet, Take 1 tablet by mouth 2 times daily (with meals), Disp: , Rfl:   .  ondansetron (ZOFRAN ODT) 4 MG disintegrating tablet, Take 1 tablet by mouth every 4 hours as needed for Nausea or Vomiting, Disp: 30 tablet, Rfl: 3  .  topiramate (TOPAMAX) 25 MG tablet, Take 1 tablet by mouth daily, Disp: 90 tablet, Rfl: 1  .  pantoprazole (PROTONIX) 40 MG tablet, Take 1 tablet by mouth 2 times daily, Disp: 60 tablet, Rfl: 3  Past Medical History:   Diagnosis Date   . Anxiety    . Back pain     "Sometimes"   . Depression    . Fatigue    . Morbid obesity (HCC)    . Sleep apnea     Uses CPAP   . Teeth missing     Upper And Lower   . Wears contact lenses     Bilateral    . Wears glasses    . Wisdom teeth extracted     4 Wisdom Teeth Extracted In Past      Past Surgical History:   Procedure Laterality Date   . DENTAL SURGERY      Teeth Extracted In Past   . ENDOSCOPY, COLON, DIAGNOSTIC  11/2016   . HYSTERECTOMY VAGINAL  2012   . LAPAROSCOPY  2011    "Twice"   . SLEEVE GASTRECTOMY  12/30/2016    Robotic   . TONSILLECTOMY AND ADENOIDECTOMY  2014   . WISDOM TOOTH EXTRACTION      4  Wisdom Teeth Extracted In Past     Social History     Social History   . Marital status: Married     Spouse name: N/A   . Number of children: N/A   . Years of education: 93     Occupational History   . RN [employer]     Social History Main Topics   . Smoking status: Never Smoker   . Smokeless tobacco: Never Used   . Alcohol use No   . Drug use: No   . Sexual activity: Yes     Partners: Male     Other Topics Concern   . None     Social History Narrative   . None     Review of Systems   Constitutional: Negative for activity change, chills, diaphoresis and fever.   HENT: Negative for sore throat, trouble swallowing and voice change.    Eyes: Negative for photophobia and visual disturbance.   Respiratory: Negative for cough, shortness of breath and wheezing.    Cardiovascular: Negative for chest pain, palpitations and leg swelling.   Gastrointestinal: Negative for abdominal pain, anal bleeding, blood in stool, constipation, diarrhea, nausea and vomiting.   Endocrine: Negative for cold intolerance, heat intolerance, polydipsia and polyuria.   Genitourinary: Negative for dysuria, frequency and hematuria.   Musculoskeletal: Positive for arthralgias (Rt hip pain occasionally and right knee pain improved). Negative for joint swelling, myalgias and neck stiffness.   Skin: Negative for color change and rash.   Neurological: Negative for seizures, speech difficulty, light-headedness and numbness.   Hematological: Negative for adenopathy. Does not bruise/bleed easily.         OBJECTIVE:    Physical Exam   Constitutional: She is oriented to person, place, and time. She appears well-developed and well-nourished. No distress.   HENT:   Head: Normocephalic and atraumatic.   Eyes: Pupils are equal, round, and reactive to light. Conjunctivae and EOM are normal. No scleral icterus.   Neck: Normal range of motion. No JVD present. No tracheal deviation present.  No thyromegaly present.   Cardiovascular: Normal rate, regular  rhythm, normal heart sounds and intact distal pulses.  Exam reveals no gallop and no friction rub.    No murmur heard.  Pulmonary/Chest: Effort normal. No stridor. No respiratory distress. She has no wheezes. She has no rales. She exhibits no tenderness.   Abdominal: Soft. Bowel sounds are normal. She exhibits no distension and no mass. There is no tenderness. There is no rebound and no guarding.   Musculoskeletal: Normal range of motion. She exhibits no edema or tenderness.   Lymphadenopathy:     She has no cervical adenopathy.   Neurological: She is alert and oriented to person, place, and time. No cranial nerve deficit. Coordination normal.   Skin: Skin is warm and dry. No rash noted. She is not diaphoretic. No erythema. No pallor.   Psychiatric: Her behavior is normal. Judgment and thought content normal.   Vitals reviewed.      Assessment / Plan:    1. Status post bariatric surgery    2. Status post laparoscopic sleeve gastrectomy          Doing very well, labs reviewed and all good except Anemia, and LDL elevation, counseled , and Fe Sulfate Rx given.      Follow Up:  Return in about 3 months (around 03/28/2018) for For imaging and tests results review, Bariatric follow up: diet, exercise & weight loss.    Estill BakesMagued Mavric Cortright, MD, FACS, FICS  Member of the [employer]    [phone]    12/29/17

## 2017-12-29 NOTE — Patient Instructions (Signed)
We are committed to providing you the best care possible.    If you receive a survey after visiting one of our offices, please take time to share your experience concerning your physician office visit.  These surveys are confidential and no health information about you is shared.    We are eager to improve for you and we are counting on your feedback to help make that happen.

## 2018-01-20 ENCOUNTER — Ambulatory Visit
Admit: 2018-01-20 | Discharge: 2018-01-20 | Payer: PRIVATE HEALTH INSURANCE | Attending: Registered Nurse | Primary: Registered Nurse

## 2018-01-20 DIAGNOSIS — J0100 Acute maxillary sinusitis, unspecified: Secondary | ICD-10-CM

## 2018-01-20 MED ORDER — DOXYCYCLINE HYCLATE 100 MG PO TABS
100 MG | ORAL_TABLET | Freq: Two times a day (BID) | ORAL | 0 refills | Status: DC
Start: 2018-01-20 — End: 2018-01-21

## 2018-01-20 MED ORDER — PREDNISONE 10 MG PO TABS
10 MG | ORAL_TABLET | ORAL | 0 refills | Status: DC
Start: 2018-01-20 — End: 2018-05-30

## 2018-01-20 NOTE — Progress Notes (Signed)
Linda Blevins  1981-03-31  01/21/18    Chief Complaint   Patient presents with   ??? Pharyngitis     white patches on throat, blister on soft palate all symtoms started 3 days ago   ??? Cough   ??? Head Congestion     and chest congestion       SUBJECTIVE:      Linda Blevins is a pleasant 37 year old female patient of Ezzie Dural, APRN-CNP who presents to the office this morning for multiple c/o. Patient states that she first noticed a sore throat x 3 days with white patches in the posterior aspect of her throat. However, patient also reports severe sinus pain/pressure, as well as nasal congestion and a congested cough. Patient denies any abdominal pain, nausea, emesis, diarrhea, or fevers. Denies chest pain or shortness of breath. Reports son has had similar issues recently, but not as severe.     Review of Systems   Constitutional: Negative for activity change, appetite change, fatigue and fever.   HENT: Positive for postnasal drip, sinus pressure, sinus pain and sore throat. Negative for congestion, ear discharge, ear pain, nosebleeds and trouble swallowing.    Eyes: Negative.    Respiratory: Negative for apnea, cough, chest tightness and shortness of breath.    Cardiovascular: Negative for chest pain and palpitations.   Gastrointestinal: Negative for abdominal pain, diarrhea, nausea and vomiting.   Endocrine: Negative.    Genitourinary: Negative for difficulty urinating, flank pain and hematuria.   Musculoskeletal: Negative for arthralgias, joint swelling and myalgias.   Skin: Negative for color change and rash.   Neurological: Negative for dizziness, light-headedness and headaches.   Psychiatric/Behavioral: Negative.  Negative for behavioral problems.       OBJECTIVE:    BP (!) 90/54    Pulse 60    Temp 98.4 ??F (36.9 ??C)    Ht 5\' 2"  (1.575 m)    Wt 171 lb 6.4 oz (77.7 kg)    Breastfeeding? No    BMI 31.35 kg/m??     Physical Exam   Constitutional: She is oriented to person, place, and time. She appears well-developed and  well-nourished.   HENT:   Head: Normocephalic.   Right Ear: External ear normal.   Left Ear: External ear normal.   Nose: Mucosal edema and rhinorrhea present. Right sinus exhibits maxillary sinus tenderness. Left sinus exhibits maxillary sinus tenderness.   Post nasal drip present with inflamed nasal turbinates bilaterally   Neck: Normal range of motion.   Cardiovascular: Normal rate and regular rhythm.   No murmur heard.  Pulmonary/Chest: Effort normal and breath sounds normal. No respiratory distress. She has no wheezes.   Abdominal: Soft. Bowel sounds are normal. There is no tenderness.   Musculoskeletal: Normal range of motion. She exhibits no edema or tenderness.   Lymphadenopathy:     She has no cervical adenopathy.   Neurological: She is alert and oriented to person, place, and time.   Skin: Skin is warm and dry. No rash noted. No erythema.   Psychiatric: She has a normal mood and affect.       ASSESSMENT:    1. Acute non-recurrent maxillary sinusitis        PLAN:    Linda Blevins was seen today for pharyngitis, cough and head congestion.    Diagnoses and all orders for this visit:    Acute non-recurrent maxillary sinusitis  -     doxycycline hyclate (VIBRA-TABS) 100 MG tablet; Take 1 tablet by mouth  2 times daily for 7 days  -     predniSONE (DELTASONE) 10 MG tablet; Take 4 tablets daily for 2 days, then 3 tablets for 2 days, 2 tablets for 2 days, then 1 tablet for 2 days  Suspect likely bacterial sinusitis. Will Rx course of Doxy as well as steroid taper. Encouraged patient to increase fluid intake and rest over next several days to facilitate healing, as well as compliance with ordered medical regiment.     Course of treatment, including any medications, possible imaging, referrals, and follow ups discussed with patient. All risks and benefits and possible side effects discussed with patient who agrees to plan of care and verbalizes understanding. All labs and imaging reviewed.       No flowsheet data  found.    Return if symptoms worsen or fail to improve.

## 2018-01-22 MED ORDER — DOXYCYCLINE HYCLATE 100 MG PO TABS
100 MG | ORAL_TABLET | ORAL | 0 refills | Status: DC
Start: 2018-01-22 — End: 2018-05-30

## 2018-01-23 ENCOUNTER — Encounter: Attending: Family | Primary: Registered Nurse

## 2018-03-05 LAB — BE WELL HEALTH SCREEN
Cholesterol: 164 MG/DL (ref <200)
Glucose: 89 MG/DL (ref 70–99)
HDL: 45 MG/DL (ref >40)
LDL Calculated: 104 MG/DL — ABNORMAL HIGH (ref <100)
Triglycerides: 77 MG/DL (ref <150)

## 2018-03-26 ENCOUNTER — Ambulatory Visit
Admit: 2018-03-26 | Discharge: 2018-03-26 | Payer: PRIVATE HEALTH INSURANCE | Attending: Registered Nurse | Primary: Registered Nurse

## 2018-03-26 DIAGNOSIS — Z0000 Encounter for general adult medical examination without abnormal findings: Secondary | ICD-10-CM

## 2018-03-26 MED ORDER — BUSPIRONE HCL 5 MG PO TABS
5 MG | ORAL_TABLET | Freq: Two times a day (BID) | ORAL | 1 refills | Status: DC
Start: 2018-03-26 — End: 2018-05-30

## 2018-03-26 NOTE — Progress Notes (Signed)
Linda Blevins   37 y.o.  female  [id number]      Chief Complaint   Patient presents with   ??? New Patient        Subjective:  Patient is here to establish care. She previously saw Ezzie Dural, NP. Patient has a history of obesity, fatigue, anxiety, and depression; and current complaints are as described below.  Health maintenance reviewed with patient.  Patient does not smoke. Patient does drink alcohol occasionally. Patient  does not use drugs.    Depression/Anxiety: has been on Wellbutrin and states that she has been doing well from a depression standpoint. Patient denies any suicidal ideation, homicidal ideation, hallucinations. However, patient does add that her anxiety has been somewhat work over the last 1-2 months. She reports being on Buspar a long time ago with good success.     Patient's past medical, surgical, social and/or family history reviewed, updated in chart. Medications and allergies also reviewed and updatedin chart.      Health Maintenance:  -HIV Screen: declines    Review of Systems   Constitutional: Negative for activity change, appetite change, fatigue and fever.   HENT: Negative for congestion, nosebleeds, sinus pressure and sinus pain.    Eyes: Negative.    Respiratory: Negative for apnea, cough, chest tightness and shortness of breath.    Cardiovascular: Negative for chest pain and palpitations.   Gastrointestinal: Negative for abdominal pain, diarrhea, nausea and vomiting.   Endocrine: Negative.    Genitourinary: Negative for difficulty urinating, flank pain and hematuria.   Musculoskeletal: Negative for arthralgias, joint swelling and myalgias.   Skin: Negative for color change and rash.   Neurological: Negative for dizziness, light-headedness and headaches.   Psychiatric/Behavioral: Negative for behavioral problems, decreased concentration, self-injury, sleep disturbance and suicidal ideas. The patient is nervous/anxious.        Current Outpatient Medications   Medication Sig Dispense  Refill   ??? busPIRone (BUSPAR) 5 MG tablet Take 1 tablet by mouth 2 times daily 60 tablet 1   ??? docusate sodium (COLACE) 100 MG capsule Take 100 mg by mouth daily     ??? ferrous sulfate 325 (65 Fe) MG tablet Take 1 tablet by mouth daily (with breakfast) 30 tablet 5   ??? buPROPion (WELLBUTRIN XL) 300 MG extended release tablet Take 1 tablet by mouth every morning 90 tablet 1   ??? vitamin B-12 (CYANOCOBALAMIN) 500 MCG tablet Take 500 mcg by mouth daily     ??? Multiple Vitamins-Minerals (THERAPEUTIC MULTIVITAMIN-MINERALS) tablet Take 1 tablet by mouth daily     ??? calcium citrate-vitamin D (CITRICAL + D) 315-250 MG-UNIT TABS per tablet Take 1 tablet by mouth 2 times daily (with meals)     ??? ondansetron (ZOFRAN ODT) 4 MG disintegrating tablet Take 1 tablet by mouth every 4 hours as needed for Nausea or Vomiting 30 tablet 3   ??? doxycycline hyclate (VIBRA-TABS) 100 MG tablet TAKE 1 TABLET BY MOUTH TWO TIMES A DAY FOR 7 DAYS 14 tablet 0   ??? predniSONE (DELTASONE) 10 MG tablet Take 4 tablets daily for 2 days, then 3 tablets for 2 days, 2 tablets for 2 days, then 1 tablet for 2 days 20 tablet 0   ??? topiramate (TOPAMAX) 25 MG tablet Take 1 tablet by mouth daily 90 tablet 1   ??? pantoprazole (PROTONIX) 40 MG tablet Take 1 tablet by mouth 2 times daily 60 tablet 3     No current facility-administered medications for this visit.  Allergies   Allergen Reactions   ??? Ciprofloxacin Hives   ??? Morphine And Related Nausea And Vomiting   ??? Pcn [Penicillins] Hives   ??? Sulfa Antibiotics Hives     Past Medical History:   Diagnosis Date   ??? Anxiety    ??? Back pain     "Sometimes"   ??? Depression    ??? Fatigue    ??? Morbid obesity (HCC)    ??? Sleep apnea     Uses CPAP   ??? Teeth missing     Upper And Lower   ??? Wears contact lenses     Bilateral    ??? Wears glasses    ??? Wisdom teeth extracted     4 Wisdom Teeth Extracted In Past     Past Surgical History:   Procedure Laterality Date   ??? DENTAL SURGERY      Teeth Extracted In Past   ??? ENDOSCOPY,  COLON, DIAGNOSTIC  11/2016   ??? HYSTERECTOMY VAGINAL  2012   ??? LAPAROSCOPY  2011    "Twice"   ??? SLEEVE GASTRECTOMY  12/30/2016    Robotic   ??? TONSILLECTOMY AND ADENOIDECTOMY  2014   ??? WISDOM TOOTH EXTRACTION      4 Wisdom Teeth Extracted In Past     Family History   Problem Relation Age of Onset   ??? Asthma Mother    ??? Depression Mother    ??? High Blood Pressure Mother    ??? High Blood Pressure Father    ??? High Cholesterol Father      Social History     Socioeconomic History   ??? Marital status: Married     Spouse name: Not on file   ??? Number of children: Not on file   ??? Years of education: 97   ??? Highest education level: Not on file   Occupational History   ??? Occupation: Engineering geologist: [employer]   Social Needs   ??? Financial resource strain: Not on file   ??? Food insecurity:     Worry: Not on file     Inability: Not on file   ??? Transportation needs:     Medical: Not on file     Non-medical: Not on file   Tobacco Use   ??? Smoking status: Never Smoker   ??? Smokeless tobacco: Never Used   Substance and Sexual Activity   ??? Alcohol use: No   ??? Drug use: No   ??? Sexual activity: Yes     Partners: Male     Birth control/protection: Surgical   Lifestyle   ??? Physical activity:     Days per week: Not on file     Minutes per session: Not on file   ??? Stress: Not on file   Relationships   ??? Social connections:     Talks on phone: Not on file     Gets together: Not on file     Attends religious service: Not on file     Active member of club or organization: Not on file     Attends meetings of clubs or organizations: Not on file     Relationship status: Not on file   ??? Intimate partner violence:     Fear of current or ex partner: Not on file     Emotionally abused: Not on file     Physically abused: Not on file     Forced sexual activity: Not on file  Other Topics Concern   ??? Not on file   Social History Narrative   ??? Not on file       Objective:  BP 110/72 (Site: Right Upper Arm, Position: Sitting, Cuff Size: Medium  Adult)    Pulse 65    Ht 5' 2.01" (1.575 m)    Wt 174 lb 6.4 oz (79.1 kg)    SpO2 98%    Breastfeeding? No    BMI 31.89 kg/m??   BP Readings from Last 3 Encounters:   03/26/18 110/72   01/20/18 (!) 90/54   12/29/17 112/62     Wt Readings from Last 3 Encounters:   03/26/18 174 lb 6.4 oz (79.1 kg)   03/05/18 174 lb (78.9 kg)   01/20/18 171 lb 6.4 oz (77.7 kg)       Physical Exam   Constitutional: She is oriented to person, place, and time. She appears well-developed and well-nourished. No distress.   HENT:   Head: Normocephalic and atraumatic.   Eyes: Pupils are equal, round, and reactive to light. Conjunctivae and EOM are normal.   Neck: Normal range of motion. Neck supple. No muscular tenderness present. No edema and no erythema present.   Cardiovascular: Normal rate, regular rhythm and normal heart sounds.   No murmur heard.  Pulmonary/Chest: Effort normal and breath sounds normal. No respiratory distress.   Abdominal: Soft. Bowel sounds are normal. There is no tenderness.   Musculoskeletal: Normal range of motion. She exhibits no edema.   Neurological: She is alert and oriented to person, place, and time. No cranial nerve deficit. Coordination normal.   Skin: Skin is warm and dry. Capillary refill takes less than 2 seconds. No rash noted. She is not diaphoretic. No erythema.   Psychiatric: She has a normal mood and affect. Her behavior is normal. Judgment and thought content normal.       Lab Results   Component Value Date    WBC 5.2 11/30/2017    HGB 12.2 (L) 11/30/2017    HCT 37.4 11/30/2017    MCV 94.9 11/30/2017    PLT 261 11/30/2017     Lab Results   Component Value Date    NA 137 12/31/2016    K 4.3 12/31/2016    CL 100 12/31/2016    CO2 26 12/31/2016    BUN 7 12/31/2016    CREATININE 0.7 12/31/2016    GLUCOSE 89 03/05/2018    CALCIUM 8.1 (L) 12/31/2016    PROT 7.2 12/18/2015    LABALBU 4.5 12/18/2015    BILITOT 0.5 12/18/2015    ALKPHOS 58 12/18/2015    AST 17 12/18/2015    ALT 22 12/18/2015    LABGLOM >60  12/31/2016    GFRAA >60 12/31/2016     Lab Results   Component Value Date    CHOL 164 03/05/2018    CHOL 159 11/30/2017    CHOL 165 03/27/2017     Lab Results   Component Value Date    TRIG 77 03/05/2018    TRIG 78 11/30/2017    TRIG 97 03/27/2017     Lab Results   Component Value Date    HDL 45 03/05/2018    HDL 44 11/30/2017    HDL 33 (L) 03/27/2017     Lab Results   Component Value Date    LDLCALC 104 (H) 03/05/2018    LDLCALC 113 (H) 03/27/2017    LDLCALC 105 (H) 02/22/2016     Lab Results   Component  Value Date    LABA1C 5.2 05/06/2016     Lab Results   Component Value Date    TSH 2.320 09/19/2014    TSHHS 3.270 11/30/2017       ASSESSMENT:      1. Encounter for medical examination to establish care    2. Gastroesophageal reflux disease without esophagitis    3. Anxiety    4. Reactive depression        PLAN:  1. Recent labs stable. Will recheck in 3-6 months. No concerns at this time.  2. Depression stable on current Wellbutrin dose. Will continue.  3. Will add Buspar 5 mg BID at this time for anxiety with close follow up in 6-8 weeks. Will adjust as necessary.    Care discussed with patient. Questions answered. Patient verbalizes understanding and agrees with plan.   After visit summary provided.   Advised to call forany problems, questions, or concerns.    Orders Placed This Encounter   Medications   ??? busPIRone (BUSPAR) 5 MG tablet     Sig: Take 1 tablet by mouth 2 times daily     Dispense:  60 tablet     Refill:  1       Return in about 2 months (around 05/26/2018).    Ruthy Dick, APRN - CNP  03/26/18  4:30 PM

## 2018-03-30 ENCOUNTER — Encounter: Attending: Surgery | Primary: Registered Nurse

## 2018-04-13 ENCOUNTER — Encounter: Attending: Family | Primary: Registered Nurse

## 2018-04-18 ENCOUNTER — Encounter: Attending: Family | Primary: Registered Nurse

## 2018-05-11 ENCOUNTER — Ambulatory Visit: Payer: Medicaid Other | Admitting: Family Medicine

## 2018-05-25 ENCOUNTER — Ambulatory Visit (INDEPENDENT_AMBULATORY_CARE_PROVIDER_SITE_OTHER): Payer: Medicaid Other | Admitting: Family Medicine

## 2018-05-25 ENCOUNTER — Encounter: Payer: Self-pay | Admitting: Family Medicine

## 2018-05-25 VITALS — BP 110/72 | HR 106 | Temp 98.3°F | Ht 63.0 in | Wt 234.8 lb

## 2018-05-25 DIAGNOSIS — R600 Localized edema: Secondary | ICD-10-CM | POA: Diagnosis not present

## 2018-05-25 DIAGNOSIS — G931 Anoxic brain damage, not elsewhere classified: Secondary | ICD-10-CM

## 2018-05-25 MED ORDER — FUROSEMIDE 20 MG PO TABS: 20.0000 mg | ORAL_TABLET | Freq: Every day | ORAL | 1 refills | Status: DC

## 2018-05-25 NOTE — Progress Notes (Signed)
BP 110/72 (BP Location: Left Arm, Patient Position: Sitting, Cuff Size: Normal)   Pulse (!) 106   Temp 98.3 F (36.8 C) (Oral)   Ht 5\' 3"  (1.6 m)   Wt 234 lb 12.8 oz (106.5 kg)   SpO2 99%   BMI 41.59 kg/m    Subjective:    Patient ID: Martha Lawson, female    DOB: 07/13/1981, 37 y.o.   MRN: [id number]  HPI: Martha Lawson is a 37 y.o. female  Chief Complaint  Patient presents with  . Paper Work    Patient states she needs a letter stating she is okay to drive. 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 Valid Drivers License.   Patient here today needing paperwork for her new job stating that she is safe to drive as part of her work duties. Does have a valid driver's license.   Also states her legs are still very swollen and tight despite being on 12.5 mg HCTZ. Denies point tenderness, redness, hx of DVT. Does not wear compression hose, watch salt intake, or elevate legs.   Past Medical History:  Diagnosis Date  . Anoxic brain injury (HCC) 07/2016  . Anoxic encephalopathy (HCC)   . Asthma    in fall due to allergies  . History of MRSA infection 07/2016   in patient's trach  . History of substance abuse    Social History   Socioeconomic History  . Marital status: Single    Spouse name: Not on file  . Number of children: Not on file  . Years of education: Not on file  . Highest education level: Not on file  Occupational History  . Not on file  Social Needs  . Financial resource strain: Not on file  . Food insecurity:    Worry: Not on file    Inability: Not on file  . Transportation needs:    Medical: Not on file    Non-medical: Not on file  Tobacco Use  . Smoking status: Current Every Day Smoker    Packs/day: 0.50    Types: Cigarettes  . Smokeless tobacco: Former Engineer, water and Sexual Activity  . Alcohol use: No  . Drug use: No    Comment: former user  . Sexual activity: Yes  Lifestyle  . Physical activity:    Days per week: Not  on file    Minutes per session: Not on file  . Stress: Not on file  Relationships  . Social connections:    Talks on phone: Not on file    Gets together: Not on file    Attends religious service: Not on file    Active member of club or organization: Not on file    Attends meetings of clubs or organizations: Not on file    Relationship status: Not on file  . Intimate partner violence:    Fear of current or ex partner: Not on file    Emotionally abused: Not on file    Physically abused: Not on file    Forced sexual activity: Not on file  Other Topics Concern  . Not on file  Social History Narrative  . Not on file   Relevant past medical, surgical, family and social history reviewed and updated as indicated. Interim medical history since our last visit reviewed. Allergies and medications reviewed and updated.  Review of Systems  Per HPI unless specifically indicated above     Objective:    BP 110/72 (  BP Location: Left Arm, Patient Position: Sitting, Cuff Size: Normal)   Pulse (!) 106   Temp 98.3 F (36.8 C) (Oral)   Ht 5\' 3"  (1.6 m)   Wt 234 lb 12.8 oz (106.5 kg)   SpO2 99%   BMI 41.59 kg/m   Wt Readings from Last 3 Encounters:  05/25/18 234 lb 12.8 oz (106.5 kg)  09/27/17 222 lb (100.7 kg)  06/14/17 211 lb (95.7 kg)    Physical Exam  Constitutional: She appears well-developed and well-nourished. No distress.  HENT:  Head: Atraumatic.  Eyes: Conjunctivae are normal.  Neck: Normal range of motion.  Cardiovascular: Normal rate and regular rhythm.  Pulmonary/Chest: Effort normal.  Musculoskeletal: Normal range of motion. She exhibits edema (moderate symmetric b/l LE edema).  Neurological: She is alert.  Skin: Skin is warm and dry.  Psychiatric: Her behavior is normal.  Nursing note and vitals reviewed.   Results for orders placed or performed in visit on 09/27/17  Microscopic Examination  Result Value Ref Range   WBC, UA >30 (A) 0 - 5 /hpf   RBC, UA >30 (A) 0  - 2 /hpf   Epithelial Cells (non renal) 0-10 0 - 10 /hpf   Renal Epithel, UA None seen None seen /hpf   Casts None seen None seen /lpf   Cast Type None seen N/A   Crystals None seen N/A   Crystal Type None seen N/A   Mucus, UA None seen Not Estab.   Bacteria, UA Many (A) None seen/Few   Yeast, UA None seen None seen   Trichomonas, UA None seen None seen  Urine Culture, Reflex  Result Value Ref Range   Urine Culture, Routine CANCELED (A)   CBC with Differential/Platelet  Result Value Ref Range   WBC 6.8 3.4 - 10.8 x10E3/uL   RBC 3.90 3.77 - 5.28 x10E6/uL   Hemoglobin 11.4 11.1 - 15.9 g/dL   Hematocrit 16.135.3 09.634.0 - 46.6 %   MCV 91 79 - 97 fL   MCH 29.2 26.6 - 33.0 pg   MCHC 32.3 31.5 - 35.7 g/dL   RDW 04.513.7 40.912.3 - 81.115.4 %   Platelets 264 150 - 379 x10E3/uL   Neutrophils 67 Not Estab. %   Lymphs 23 Not Estab. %   Monocytes 8 Not Estab. %   Eos 2 Not Estab. %   Basos 0 Not Estab. %   Neutrophils Absolute 4.6 1.4 - 7.0 x10E3/uL   Lymphocytes Absolute 1.5 0.7 - 3.1 x10E3/uL   Monocytes Absolute 0.6 0.1 - 0.9 x10E3/uL   EOS (ABSOLUTE) 0.1 0.0 - 0.4 x10E3/uL   Basophils Absolute 0.0 0.0 - 0.2 x10E3/uL   Immature Granulocytes 0 Not Estab. %   Immature Grans (Abs) 0.0 0.0 - 0.1 x10E3/uL  Comprehensive metabolic panel  Result Value Ref Range   Glucose 116 (H) 65 - 99 mg/dL   BUN 10 6 - 20 mg/dL   Creatinine, Ser 9.140.76 0.57 - 1.00 mg/dL   GFR calc non Af Amer 103 >59 mL/min/1.73   GFR calc Af Amer 118 >59 mL/min/1.73   BUN/Creatinine Ratio 13 9 - 23   Sodium 142 134 - 144 mmol/L   Potassium 3.7 3.5 - 5.2 mmol/L   Chloride 103 96 - 106 mmol/L   CO2 26 20 - 29 mmol/L   Calcium 8.7 8.7 - 10.2 mg/dL   Total Protein 6.6 6.0 - 8.5 g/dL   Albumin 3.8 3.5 - 5.5 g/dL   Globulin, Total 2.8 1.5 - 4.5  g/dL   Albumin/Globulin Ratio 1.4 1.2 - 2.2   Bilirubin Total 0.4 0.0 - 1.2 mg/dL   Alkaline Phosphatase 40 39 - 117 IU/L   AST 14 0 - 40 IU/L   ALT 16 0 - 32 IU/L  Lipid Panel w/o  Chol/HDL Ratio  Result Value Ref Range   Cholesterol, Total 131 100 - 199 mg/dL   Triglycerides 94 0 - 149 mg/dL   HDL 35 (L) >09 mg/dL   VLDL Cholesterol Cal 19 5 - 40 mg/dL   LDL Calculated 77 0 - 99 mg/dL  UA/M w/rflx Culture, Routine  Result Value Ref Range   Specific Gravity, UA 1.025 1.005 - 1.030   pH, UA 6.0 5.0 - 7.5   Color, UA Yellow Yellow   Appearance Ur Cloudy (A) Clear   Leukocytes, UA 2+ (A) Negative   Protein, UA 1+ (A) Negative/Trace   Glucose, UA Negative Negative   Ketones, UA Negative Negative   RBC, UA Negative Negative   Bilirubin, UA Negative Negative   Urobilinogen, Ur 0.2 0.2 - 1.0 mg/dL   Nitrite, UA Positive (A) Negative   Microscopic Examination See below:    Urinalysis Reflex Comment   TSH  Result Value Ref Range   TSH 0.174 (L) 0.450 - 4.500 uIU/mL      Assessment & Plan:   Problem List Items Addressed This Visit      Nervous and Auditory   Anoxic brain injury (HCC) - Primary    Will refer to Neurology given her hx of overdose with anoxic brain injury to assess long-term effects and impact on driving. My past interactions with the patient have revealed some occasional misuse of words, confusion, and flat affect but unclear what her baseline was prior to brain injury. Will await their opinion      Relevant Orders   Ambulatory referral to Neurology    Other Visit Diagnoses    Bilateral leg edema       Will cautiously increase from HCTZ to low dose lasix, monitor BP closely at home. Compression stockings, exercise, salt restriction, leg elevation       Follow up plan: Return in about 1 month (around 06/22/2018) for LE Edema, BMP.

## 2018-05-25 NOTE — Assessment & Plan Note (Signed)
Will refer to Neurology given her hx of overdose with anoxic brain injury to assess long-term effects and impact on driving. My past interactions with the patient have revealed some occasional misuse of words, confusion, and flat affect but unclear what her baseline was prior to brain injury. Will await their opinion

## 2018-05-25 NOTE — Patient Instructions (Addendum)
Follow up in 1 month   

## 2018-05-30 ENCOUNTER — Ambulatory Visit
Admit: 2018-05-30 | Discharge: 2018-05-30 | Payer: PRIVATE HEALTH INSURANCE | Attending: Registered Nurse | Primary: Registered Nurse

## 2018-05-30 DIAGNOSIS — F419 Anxiety disorder, unspecified: Secondary | ICD-10-CM

## 2018-05-30 MED ORDER — BUSPIRONE HCL 5 MG PO TABS
5 MG | ORAL_TABLET | Freq: Two times a day (BID) | ORAL | 5 refills | Status: AC
Start: 2018-05-30 — End: 2018-06-29

## 2018-05-30 NOTE — Progress Notes (Signed)
Linda Blevins  [birth year]  05/30/18    Chief Complaint   Patient presents with   ??? Depression     follow up on medication        SUBJECTIVE:      Patient presents to the office this afternoon for 2 month follow up.    Linda Blevins presents as a follow up on anxiety.  Current medication for anxiety includes Buspar 5 mg BID.  Linda Blevins has been on this medication for 2 months.  The medication is  being tolerated well.  Since starting the medication the symptoms of anxiety have significantly improved.  Linda Blevins  is not in counseling. Reports mood is much better at this time.     Review of Systems   Constitutional: Negative for activity change, appetite change, fatigue and fever.   HENT: Negative.    Eyes: Negative.    Respiratory: Negative for apnea, cough, chest tightness and shortness of breath.    Cardiovascular: Negative for chest pain and palpitations.   Gastrointestinal: Negative for abdominal pain, diarrhea, nausea and vomiting.   Endocrine: Negative.    Genitourinary: Negative for difficulty urinating, flank pain and hematuria.   Musculoskeletal: Negative.    Skin: Negative for color change and rash.   Allergic/Immunologic: Negative.    Neurological: Negative for dizziness, light-headedness and headaches.   Psychiatric/Behavioral: Negative.  Negative for agitation, behavioral problems, confusion, decreased concentration, self-injury, sleep disturbance and suicidal ideas. The patient is not nervous/anxious.        OBJECTIVE:    BP 122/68    Pulse 76    Resp 20    Ht 5\' 2"  (1.575 m)    Wt 177 lb 3.2 oz (80.4 kg)    SpO2 98%    BMI 32.41 kg/m??     Physical Exam   Constitutional: She is oriented to person, place, and time. She appears well-developed and well-nourished. No distress.   HENT:   Head: Normocephalic and atraumatic.   Eyes: Pupils are equal, round, and reactive to light. Conjunctivae and EOM are normal.   Neck: Normal range of motion. Neck supple. No muscular tenderness present. No edema and no erythema present.    Cardiovascular: Normal rate, regular rhythm and normal heart sounds. Exam reveals no friction rub.   No murmur heard.  Pulmonary/Chest: Effort normal and breath sounds normal. No respiratory distress. She has no wheezes.   Abdominal: Soft. Bowel sounds are normal.   Musculoskeletal: Normal range of motion.   Neurological: She is alert and oriented to person, place, and time.   Skin: Skin is warm and dry. Capillary refill takes less than 2 seconds. She is not diaphoretic.   Psychiatric: She has a normal mood and affect. Her behavior is normal. Judgment and thought content normal.       ASSESSMENT:    1. Anxiety        PLAN:    Linda Blevins was seen today for depression.    Diagnoses and all orders for this visit:    Anxiety- much improved with addition of Buspar. States she is very happy with new regiment. Will make no changes at this time.   -     busPIRone (BUSPAR) 5 MG tablet; Take 1 tablet by mouth 2 times daily    Course of treatment, including any medications, possible imaging, referrals, and follow ups discussed with patient. All risks and benefits and possible side effects discussed with patient who agrees to plan of care and verbalizes understanding. All labs and imaging reviewed.  No flowsheet data found.    Return in about 6 months (around 11/30/2018).

## 2018-06-04 ENCOUNTER — Telehealth: Payer: Self-pay | Admitting: *Deleted

## 2018-06-04 ENCOUNTER — Ambulatory Visit: Payer: Self-pay | Admitting: Diagnostic Neuroimaging

## 2018-06-04 NOTE — Telephone Encounter (Signed)
Pt no showed appointment for today.  

## 2018-06-05 ENCOUNTER — Encounter: Payer: Self-pay | Admitting: Diagnostic Neuroimaging

## 2018-06-25 ENCOUNTER — Ambulatory Visit: Payer: Medicaid Other | Admitting: Family Medicine

## 2018-06-27 ENCOUNTER — Telehealth: Payer: Self-pay | Admitting: Family Medicine

## 2018-06-27 ENCOUNTER — Ambulatory Visit: Payer: Medicaid Other | Admitting: Family Medicine

## 2018-06-27 MED ORDER — FUROSEMIDE 20 MG PO TABS: 20.0000 mg | ORAL_TABLET | Freq: Every day | ORAL | 0 refills | Status: DC

## 2018-06-27 NOTE — Telephone Encounter (Addendum)
Patient is requesting a refill on her BP medication be sent to her phar but she did not specify the name of the BP med. She had an appt today but was arrived later than 10 min policy so rescheduled it for tomorrow @ 4.  She states the script was lost so she was not able to get it filled. I am unsure if she is referring to the lasix.   [facility]   I told her I would send you this message.  Thank you

## 2018-06-27 NOTE — Telephone Encounter (Signed)
Rx sent 

## 2018-06-28 ENCOUNTER — Encounter: Payer: Self-pay | Admitting: Family Medicine

## 2018-06-28 ENCOUNTER — Other Ambulatory Visit: Payer: Self-pay

## 2018-06-28 ENCOUNTER — Ambulatory Visit (INDEPENDENT_AMBULATORY_CARE_PROVIDER_SITE_OTHER): Payer: Medicaid Other | Admitting: Family Medicine

## 2018-06-28 VITALS — BP 133/84 | HR 109 | Temp 98.1°F | Ht 63.0 in | Wt 235.0 lb

## 2018-06-28 DIAGNOSIS — R000 Tachycardia, unspecified: Secondary | ICD-10-CM

## 2018-06-28 DIAGNOSIS — Z23 Encounter for immunization: Secondary | ICD-10-CM | POA: Diagnosis not present

## 2018-06-28 DIAGNOSIS — R7989 Other specified abnormal findings of blood chemistry: Secondary | ICD-10-CM | POA: Diagnosis not present

## 2018-06-28 DIAGNOSIS — R030 Elevated blood-pressure reading, without diagnosis of hypertension: Secondary | ICD-10-CM

## 2018-06-28 DIAGNOSIS — R600 Localized edema: Secondary | ICD-10-CM

## 2018-06-28 MED ORDER — PROPRANOLOL HCL ER 60 MG PO CP24: 60.0000 mg | ORAL_CAPSULE | Freq: Every day | ORAL | 3 refills | Status: DC

## 2018-06-28 MED ORDER — FUROSEMIDE 20 MG PO TABS: 20.0000 mg | ORAL_TABLET | Freq: Every day | ORAL | 2 refills | Status: DC

## 2018-06-28 NOTE — Progress Notes (Signed)
BP 133/84   Pulse (!) 109   Temp 98.1 F (36.7 C) (Oral)   Ht 5\' 3"  (1.6 m)   Wt 235 lb (106.6 kg)   SpO2 98%   BMI 41.63 kg/m    Subjective:    Patient ID: Martha Lawson, female    DOB: [birth year], 37 y.o.   MRN: [id number]  HPI: Martha Lawson is a 37 y.o. female  Chief Complaint  Patient presents with  . Medication F/u    Lasix  . Edema    1 m f/u   Pt here today for 1 month f/u BP and LE edema after initiation of lasix. Notes improvement in her swelling but still has persisting edema. Wears compression hose nightly which helps. BPs still high normal or just above normal limits when she checks outside of clinic. Denies side effects to the medication, CP, SOB.   Had abnormal TSH at previous visit that was not rechecked.     Relevant past medical, surgical, family and social history reviewed and updated as indicated. Interim medical history since our last visit reviewed. Allergies and medications reviewed and updated.  Review of Systems  Per HPI unless specifically indicated above     Objective:    BP 133/84   Pulse (!) 109   Temp 98.1 F (36.7 C) (Oral)   Ht 5\' 3"  (1.6 m)   Wt 235 lb (106.6 kg)   SpO2 98%   BMI 41.63 kg/m   Wt Readings from Last 3 Encounters:  06/28/18 235 lb (106.6 kg)  05/25/18 234 lb 12.8 oz (106.5 kg)  09/27/17 222 lb (100.7 kg)    Physical Exam  Constitutional: She is oriented to person, place, and time. She appears well-developed and well-nourished. No distress.  HENT:  Head: Atraumatic.  Eyes: Conjunctivae and EOM are normal.  Neck: Normal range of motion. Neck supple. No thyromegaly present.  Cardiovascular: Normal rate.  Tachycardic  Pulmonary/Chest: Effort normal and breath sounds normal.  Musculoskeletal: Normal range of motion. She exhibits edema (1+ edema b/l LEs).  Neurological: She is alert and oriented to person, place, and time.  Skin: Skin is warm and dry.  Psychiatric:  Flat affect, baseline for pt  Nursing  note and vitals reviewed.   Results for orders placed or performed in visit on 06/28/18  Basic Metabolic Panel (BMET)  Result Value Ref Range   Glucose 81 65 - 99 mg/dL   BUN 15 6 - 20 mg/dL   Creatinine, Ser 7.82 0.57 - 1.00 mg/dL   GFR calc non Af Amer 80 >59 mL/min/1.73   GFR calc Af Amer 92 >59 mL/min/1.73   BUN/Creatinine Ratio 16 9 - 23   Sodium 141 134 - 144 mmol/L   Potassium 3.8 3.5 - 5.2 mmol/L   Chloride 100 96 - 106 mmol/L   CO2 25 20 - 29 mmol/L   Calcium 9.0 8.7 - 10.2 mg/dL  TSH  Result Value Ref Range   TSH 0.699 0.450 - 4.500 uIU/mL      Assessment & Plan:   Problem List Items Addressed This Visit    None    Visit Diagnoses    Lower extremity edema    -  Primary   Improvement noted with lasix. Will check BMP, continue current regimen   Relevant Orders   Basic Metabolic Panel (BMET) (Completed)   Need for diphtheria-tetanus-pertussis (Tdap) vaccine       Relevant Orders   Tdap vaccine greater than or equal to  7yo IM (Completed)   Abnormal TSH       Will recheck today   Relevant Orders   TSH (Completed)   Elevated blood pressure, situational       DASH diet, lasix and propranolol should help keep under control. Continue to monitor  closely   Relevant Medications   propranolol ER (INDERAL LA) 60 MG 24 hr capsule   furosemide (LASIX) 20 MG tablet   Tachycardia       Asymptomatic but appears persistent looking back. Start propranolol and monitor closely       Follow up plan: Return for CPE after 11/7.

## 2018-06-29 ENCOUNTER — Encounter: Attending: Family | Primary: Registered Nurse

## 2018-06-29 LAB — BASIC METABOLIC PANEL
BUN/Creatinine Ratio: 16 (ref 9–23)
BUN: 15 mg/dL (ref 6–20)
CALCIUM: 9 mg/dL (ref 8.7–10.2)
CO2: 25 mmol/L (ref 20–29)
CREATININE: 0.93 mg/dL (ref 0.57–1.00)
Chloride: 100 mmol/L (ref 96–106)
GFR, EST AFRICAN AMERICAN: 92 mL/min/{1.73_m2} (ref >59)
GFR, EST NON AFRICAN AMERICAN: 80 mL/min/{1.73_m2} (ref >59)
Glucose: 81 mg/dL (ref 65–99)
POTASSIUM: 3.8 mmol/L (ref 3.5–5.2)
Sodium: 141 mmol/L (ref 134–144)

## 2018-06-29 LAB — TSH: TSH: 0.699 u[IU]/mL (ref 0.450–4.500)

## 2018-06-30 NOTE — Patient Instructions (Signed)
Follow up for CPE 

## 2018-07-16 ENCOUNTER — Encounter

## 2018-07-16 MED ORDER — BUPROPION HCL ER (XL) 300 MG PO TB24
300 MG | ORAL_TABLET | Freq: Every morning | ORAL | 1 refills | Status: DC
Start: 2018-07-16 — End: 2018-08-07

## 2018-07-30 ENCOUNTER — Ambulatory Visit
Admit: 2018-07-30 | Discharge: 2018-07-30 | Payer: PRIVATE HEALTH INSURANCE | Attending: Family | Primary: Registered Nurse

## 2018-07-30 DIAGNOSIS — J0100 Acute maxillary sinusitis, unspecified: Secondary | ICD-10-CM

## 2018-07-30 LAB — POCT RAPID STREP A DNA: Streptococcus A RNA: NEGATIVE

## 2018-07-30 MED ORDER — DOXYCYCLINE HYCLATE 100 MG PO TABS
100 MG | ORAL_TABLET | Freq: Two times a day (BID) | ORAL | 0 refills | Status: AC
Start: 2018-07-30 — End: 2018-08-09

## 2018-07-30 MED ORDER — FLUTICASONE PROPIONATE 50 MCG/ACT NA SUSP
50 MCG/ACT | Freq: Every day | NASAL | 0 refills | Status: AC
Start: 2018-07-30 — End: ?

## 2018-07-30 NOTE — Progress Notes (Signed)
Linda Blevins   37 y.o.  female  [id number]      Chief Complaint   Patient presents with   ??? Cough   ??? Pharyngitis   ??? Head Congestion   ??? Chest Congestion        Subjective:  37 y.o.female is here for a follow up. She has the following chronic/acute medical problems:  Patient Active Problem List   Diagnosis   ??? Anxiety   ??? Reactive depression   ??? Gastroesophageal reflux disease without esophagitis   ??? SUI (stress urinary incontinence, female)   ??? Snores   ??? Arthritis   ??? Primary stabbing headache   ??? Dyslipidemia   ??? Status post laparoscopic sleeve gastrectomy   ??? Status post bariatric surgery       Cough   This is a new problem. The current episode started in the past 7 days (6 days ago). The problem has been unchanged. Episode frequency: intermittantly. The cough is productive of sputum. Associated symptoms include chills, myalgias, nasal congestion, rhinorrhea, a sore throat and shortness of breath (over the weekend). Pertinent negatives include no chest pain, ear congestion, ear pain, fever, headaches, heartburn, hemoptysis, postnasal drip, rash, sweats, weight loss or wheezing. Nothing aggravates the symptoms. Treatments tried: Dayquil/Nyquil/Sudafed. The treatment provided no relief.       Review of Systems   Constitutional: Positive for appetite change (decreased), chills and fatigue. Negative for fever and weight loss.   HENT: Positive for congestion, rhinorrhea and sore throat. Negative for ear pain, postnasal drip, sinus pressure, sinus pain and sneezing.    Respiratory: Positive for cough and shortness of breath (over the weekend). Negative for hemoptysis, chest tightness and wheezing.    Cardiovascular: Negative for chest pain and palpitations.   Gastrointestinal: Negative for diarrhea, heartburn, nausea and vomiting.   Musculoskeletal: Positive for myalgias.   Skin: Negative for rash.   Neurological: Negative for dizziness, light-headedness and headaches.       Current Outpatient Medications   Medication  Sig Dispense Refill   ??? doxycycline hyclate (VIBRA-TABS) 100 MG tablet Take 1 tablet by mouth 2 times daily for 10 days 20 tablet 0   ??? fluticasone (FLONASE) 50 MCG/ACT nasal spray 1 spray by Nasal route daily 1 Bottle 0   ??? buPROPion (WELLBUTRIN XL) 300 MG extended release tablet Take 1 tablet by mouth every morning 90 tablet 1   ??? docusate sodium (COLACE) 100 MG capsule Take 100 mg by mouth daily     ??? vitamin B-12 (CYANOCOBALAMIN) 500 MCG tablet Take 500 mcg by mouth daily     ??? Multiple Vitamins-Minerals (THERAPEUTIC MULTIVITAMIN-MINERALS) tablet Take 1 tablet by mouth daily     ??? calcium citrate-vitamin D (CITRICAL + D) 315-250 MG-UNIT TABS per tablet Take 1 tablet by mouth 2 times daily (with meals)     ??? ondansetron (ZOFRAN ODT) 4 MG disintegrating tablet Take 1 tablet by mouth every 4 hours as needed for Nausea or Vomiting 30 tablet 3     No current facility-administered medications for this visit.         Past medical, family,surgical history reviewed today.     Objective:  BP 108/70    Pulse 56    Temp 98.4 ??F (36.9 ??C) (Oral)    Wt 182 lb 3.2 oz (82.6 kg)    SpO2 99%    BMI 33.32 kg/m??   BP Readings from Last 3 Encounters:   07/30/18 108/70   05/30/18 122/68   03/26/18  110/72     Wt Readings from Last 3 Encounters:   07/30/18 182 lb 3.2 oz (82.6 kg)   05/30/18 177 lb 3.2 oz (80.4 kg)   03/26/18 174 lb 6.4 oz (79.1 kg)         Physical Exam   Constitutional: She is oriented to person, place, and time. She appears well-developed and well-nourished.   HENT:   Head: Normocephalic.   Right Ear: Tympanic membrane, external ear and ear canal normal.   Left Ear: Tympanic membrane, external ear and ear canal normal.   Nose: Mucosal edema and rhinorrhea present. Right sinus exhibits maxillary sinus tenderness. Right sinus exhibits no frontal sinus tenderness. Left sinus exhibits maxillary sinus tenderness. Left sinus exhibits no frontal sinus tenderness.   Mouth/Throat: Posterior oropharyngeal erythema present.    Neck: Neck supple.   Cardiovascular: Normal rate, regular rhythm and normal heart sounds.   Pulmonary/Chest: Effort normal and breath sounds normal.   Neurological: She is alert and oriented to person, place, and time.   Skin: Skin is warm and dry.   Psychiatric: She has a normal mood and affect. Her behavior is normal.       Lab Results   Component Value Date    WBC 5.2 11/30/2017    HGB 12.2 (L) 11/30/2017    HCT 37.4 11/30/2017    MCV 94.9 11/30/2017    PLT 261 11/30/2017     Lab Results   Component Value Date    NA 137 12/31/2016    K 4.3 12/31/2016    CL 100 12/31/2016    CO2 26 12/31/2016    BUN 7 12/31/2016    CREATININE 0.7 12/31/2016    GLUCOSE 89 03/05/2018    CALCIUM 8.1 (L) 12/31/2016    PROT 7.2 12/18/2015    LABALBU 4.5 12/18/2015    BILITOT 0.5 12/18/2015    ALKPHOS 58 12/18/2015    AST 17 12/18/2015    ALT 22 12/18/2015    LABGLOM >60 12/31/2016    GFRAA >60 12/31/2016     Lab Results   Component Value Date    CHOL 164 03/05/2018    CHOL 159 11/30/2017    CHOL 165 03/27/2017     Lab Results   Component Value Date    TRIG 77 03/05/2018    TRIG 78 11/30/2017    TRIG 97 03/27/2017     Lab Results   Component Value Date    HDL 45 03/05/2018    HDL 44 11/30/2017    HDL 33 (L) 03/27/2017     Lab Results   Component Value Date    LDLCALC 104 (H) 03/05/2018    LDLCALC 113 (H) 03/27/2017    LDLCALC 105 (H) 02/22/2016     Lab Results   Component Value Date    LABA1C 5.2 05/06/2016     Lab Results   Component Value Date    TSH 2.320 09/19/2014    TSHHS 3.270 11/30/2017     Results for orders placed or performed in visit on 07/30/18   POCT Rapid Strep A DNA (Alere i)   Result Value Ref Range    Streptococcus A RNA Negative          ASSESSMENT/PLAN:      1. Acute non-recurrent maxillary sinusitis  Encouraged fluids.  - doxycycline hyclate (VIBRA-TABS) 100 MG tablet; Take 1 tablet by mouth 2 times daily for 10 days  Dispense: 20 tablet; Refill: 0  - fluticasone (FLONASE) 50 MCG/ACT nasal  spray; 1 spray by Nasal  route daily  Dispense: 1 Bottle; Refill: 0    2. Sore throat  - POCT Rapid Strep A DNA (Alere i)          Medications Discontinued During This Encounter   Medication Reason   ??? ferrous sulfate 325 (65 Fe) MG tablet LIST CLEANUP           Care discussed with patient. Questions answered. Patient verbalizes understanding and agrees with plan.   After visit summary provided.   Advised to call for any problems, questions, or concerns.      Return if symptoms worsen or fail to improve.                                             Signed:  Alba Destine, APRN - CNP  07/30/18  7:51 PM

## 2018-07-30 NOTE — Patient Instructions (Signed)
We are committed to providing you the best care possible.    If you receive a survey after visiting one of our offices, please take time to share your experience concerning your physician office visit.  These surveys are confidential and no health information about you is shared.    We are eager to improve for you and we are counting on your feedback to help make that happen.

## 2018-08-04 ENCOUNTER — Encounter

## 2018-08-07 ENCOUNTER — Encounter

## 2018-08-07 MED ORDER — BUPROPION HCL ER (XL) 300 MG PO TB24
300 MG | ORAL_TABLET | Freq: Every morning | ORAL | 1 refills | Status: AC
Start: 2018-08-07 — End: ?

## 2018-08-24 ENCOUNTER — Encounter

## 2018-10-01 ENCOUNTER — Encounter: Payer: Medicaid Other | Admitting: Family Medicine

## 2018-11-30 ENCOUNTER — Encounter: Attending: Registered Nurse | Primary: Registered Nurse

## 2018-12-31 ENCOUNTER — Other Ambulatory Visit: Payer: Self-pay | Admitting: Family Medicine

## 2018-12-31 MED ORDER — OSELTAMIVIR PHOSPHATE 75 MG PO CAPS: 75.0000 mg | ORAL_CAPSULE | Freq: Two times a day (BID) | ORAL | 0 refills | Status: DC

## 2020-10-01 ENCOUNTER — Other Ambulatory Visit: Payer: Self-pay

## 2020-10-01 ENCOUNTER — Ambulatory Visit (INDEPENDENT_AMBULATORY_CARE_PROVIDER_SITE_OTHER): Payer: Medicaid Other | Admitting: Unknown Physician Specialty

## 2020-10-01 ENCOUNTER — Encounter: Payer: Self-pay | Admitting: Unknown Physician Specialty

## 2020-10-01 MED ORDER — SAXENDA 18 MG/3ML ~~LOC~~ SOPN: 0.2500 mg | PEN_INJECTOR | SUBCUTANEOUS | 1 refills | Status: DC

## 2020-10-01 NOTE — Progress Notes (Signed)
BP 127/85   Pulse 97   Temp 98.2 F (36.8 C) (Oral)   Ht 5' 2.3" (1.582 m)   Wt 258 lb 4.8 oz (117.2 kg)   LMP 09/26/2020 (Exact Date)   SpO2 98%   BMI 46.79 kg/m    Subjective:    Patient ID: Martha Lawson, female    DOB: 08/28/1981, 39 y.o.   MRN: [id number]  HPI: Martha Lawson is a 39 y.o. female who presents with assistance for weight loss and requesting weight loss injectable. Took Phentermine 2 years ago that was prescribed by weight loss clinic, good results received but regain weight plus addition. For the  past she has been walking the track four times a week for 2 miles as well as decreasing her carbohydrate intake and increasing fluids with no change in weight.   Chief Complaint  Patient presents with  . Weight Loss    pt states she would like to discuss weight loss. States she has been on phentermine in the past and also tried weight watchers and keto diets in the past. Would like to discuss starting an injection for weight loss.      Relevant past medical, surgical, family and social history reviewed and updated as indicated. Interim medical history since our last visit reviewed. Allergies and medications reviewed and updated.  Review of Systems  Constitutional: Positive for unexpected weight change.  HENT:       Scant hair loss  Gastrointestinal:       Hx of IBS  Psychiatric/Behavioral:       Some occasional anxiety.   All other systems reviewed and are negative.   Per HPI unless specifically indicated above     Objective:    BP 127/85   Pulse 97   Temp 98.2 F (36.8 C) (Oral)   Ht 5' 2.3" (1.582 m)   Wt 258 lb 4.8 oz (117.2 kg)   LMP 09/26/2020 (Exact Date)   SpO2 98%   BMI 46.79 kg/m   Wt Readings from Last 3 Encounters:  10/01/20 258 lb 4.8 oz (117.2 kg)  06/28/18 235 lb (106.6 kg)  05/25/18 234 lb 12.8 oz (106.5 kg)    Physical Exam Constitutional:      Appearance: Normal appearance. She is obese.  HENT:     Head:  Normocephalic.  Cardiovascular:     Rate and Rhythm: Normal rate and regular rhythm.     Pulses: Normal pulses.     Heart sounds: Normal heart sounds.  Pulmonary:     Effort: Pulmonary effort is normal.     Breath sounds: Normal breath sounds.  Abdominal:     General: Bowel sounds are normal.     Palpations: Abdomen is soft.  Musculoskeletal:        General: Normal range of motion.     Cervical back: Normal range of motion.  Skin:    General: Skin is warm and dry.  Neurological:     General: No focal deficit present.     Mental Status: She is alert and oriented to person, place, and time.  Psychiatric:        Mood and Affect: Mood normal.        Behavior: Behavior normal.        Thought Content: Thought content normal.     Results for orders placed or performed in visit on 06/28/18  Basic Metabolic Panel (BMET)  Result Value Ref Range   Glucose 81 65 - 99 mg/dL  BUN 15 6 - 20 mg/dL   Creatinine, Ser 7.16 0.57 - 1.00 mg/dL   GFR calc non Af Amer 80 >59 mL/min/1.73   GFR calc Af Amer 92 >59 mL/min/1.73   BUN/Creatinine Ratio 16 9 - 23   Sodium 141 134 - 144 mmol/L   Potassium 3.8 3.5 - 5.2 mmol/L   Chloride 100 96 - 106 mmol/L   CO2 25 20 - 29 mmol/L   Calcium 9.0 8.7 - 10.2 mg/dL  TSH  Result Value Ref Range   TSH 0.699 0.450 - 4.500 uIU/mL      Assessment & Plan:   Problem List Items Addressed This Visit      Unprioritized   Morbid obesity (HCC) - Primary    Labs ordered. Started Saxenda SQ weekly. RTC in 4 weeks to increase dosage and assess effect.  Continue top encourage current diet and exercise.        Relevant Medications   Liraglutide -Weight Management (SAXENDA) 18 MG/3ML SOPN   Other Relevant Orders   CBC with Differential/Platelet   Lipid Panel w/o Chol/HDL Ratio   Comprehensive metabolic panel   Thyroid Panel With TSH       Follow up plan: Return in about 4 weeks (around 10/29/2020) for 1 mth Saxenda follow up .

## 2020-10-01 NOTE — Assessment & Plan Note (Signed)
Labs ordered. Started Saxenda SQ weekly. RTC in 4 weeks to increase dosage and assess effect.  Continue top encourage current diet and exercise.

## 2020-10-02 LAB — LIPID PANEL W/O CHOL/HDL RATIO
Cholesterol, Total: 172 mg/dL (ref 100–199)
HDL: 37 mg/dL — ABNORMAL LOW (ref >39)
LDL Chol Calc (NIH): 118 mg/dL — ABNORMAL HIGH (ref 0–99)
Triglycerides: 90 mg/dL (ref 0–149)
VLDL Cholesterol Cal: 17 mg/dL (ref 5–40)

## 2020-10-02 LAB — COMPREHENSIVE METABOLIC PANEL
ALT: 19 IU/L (ref 0–32)
AST: 13 IU/L (ref 0–40)
Albumin/Globulin Ratio: 1.3 (ref 1.2–2.2)
Albumin: 4.1 g/dL (ref 3.8–4.8)
Alkaline Phosphatase: 59 IU/L (ref 44–121)
BUN/Creatinine Ratio: 17 (ref 9–23)
BUN: 15 mg/dL (ref 6–20)
Bilirubin Total: 0.3 mg/dL (ref 0.0–1.2)
CO2: 21 mmol/L (ref 20–29)
Calcium: 8.9 mg/dL (ref 8.7–10.2)
Chloride: 100 mmol/L (ref 96–106)
Creatinine, Ser: 0.9 mg/dL (ref 0.57–1.00)
GFR calc Af Amer: 94 mL/min/{1.73_m2} (ref >59)
GFR calc non Af Amer: 82 mL/min/{1.73_m2} (ref >59)
Globulin, Total: 3.1 g/dL (ref 1.5–4.5)
Glucose: 93 mg/dL (ref 65–99)
Potassium: 4.2 mmol/L (ref 3.5–5.2)
Sodium: 140 mmol/L (ref 134–144)
Total Protein: 7.2 g/dL (ref 6.0–8.5)

## 2020-10-02 LAB — CBC WITH DIFFERENTIAL/PLATELET
Basophils Absolute: 0.1 10*3/uL (ref 0.0–0.2)
Basos: 1 %
EOS (ABSOLUTE): 0.2 10*3/uL (ref 0.0–0.4)
Eos: 2 %
Hematocrit: 39.3 % (ref 34.0–46.6)
Hemoglobin: 12.7 g/dL (ref 11.1–15.9)
Immature Grans (Abs): 0 10*3/uL (ref 0.0–0.1)
Immature Granulocytes: 1 %
Lymphocytes Absolute: 2 10*3/uL (ref 0.7–3.1)
Lymphs: 27 %
MCH: 29.4 pg (ref 26.6–33.0)
MCHC: 32.3 g/dL (ref 31.5–35.7)
MCV: 91 fL (ref 79–97)
Monocytes Absolute: 0.7 10*3/uL (ref 0.1–0.9)
Monocytes: 9 %
Neutrophils Absolute: 4.6 10*3/uL (ref 1.4–7.0)
Neutrophils: 60 %
Platelets: 300 10*3/uL (ref 150–450)
RBC: 4.32 x10E6/uL (ref 3.77–5.28)
RDW: 12.9 % (ref 11.7–15.4)
WBC: 7.6 10*3/uL (ref 3.4–10.8)

## 2020-10-02 LAB — THYROID PANEL WITH TSH
Free Thyroxine Index: 1.9 (ref 1.2–4.9)
T3 Uptake Ratio: 25 % (ref 24–39)
T4, Total: 7.6 ug/dL (ref 4.5–12.0)
TSH: 0.501 u[IU]/mL (ref 0.450–4.500)

## 2020-10-07 ENCOUNTER — Telehealth: Payer: Self-pay

## 2020-10-07 NOTE — Telephone Encounter (Signed)
Received fax from pharmacy stating that on the Saxenda pen inectors 0.25 mg is not an option to dial on the pen. Please advise

## 2020-10-09 NOTE — Telephone Encounter (Signed)
Pt seen by cheryl 11/11 message was sent to cheryl 2 days ago pt is wanting a call back. Please advise.

## 2020-10-09 NOTE — Telephone Encounter (Signed)
  PT requesting for someone to f/up with her / please advise

## 2020-10-09 NOTE — Telephone Encounter (Signed)
Can we see what she needs? I'm happy to call once I know what's going on

## 2020-10-09 NOTE — Telephone Encounter (Signed)
Patient needs the Saxenda pen corrected. It was sent in to inject 0.25 mg and the pharmacy states that the pen cannot be dialed to this dose. Requesting for new RX to be sent in.

## 2020-10-11 MED ORDER — SAXENDA 18 MG/3ML ~~LOC~~ SOPN: PEN_INJECTOR | SUBCUTANEOUS | 2 refills | Status: DC

## 2020-10-11 NOTE — Addendum Note (Signed)
Addended by: Dorcas Carrow on: 10/11/2020 10:47 AM   Modules accepted: Orders

## 2020-10-13 ENCOUNTER — Telehealth: Payer: Self-pay

## 2020-10-13 NOTE — Telephone Encounter (Signed)
PA started for Saxenda 6mg /ml pen inj 3 ml QTY 15, through cover my meds, Key [id number]

## 2020-10-14 NOTE — Telephone Encounter (Signed)
PA has been denies

## 2020-10-20 NOTE — Telephone Encounter (Signed)
Called and spoke with patient informed her that there is not any medication for wight loss that is affordable.

## 2020-10-20 NOTE — Telephone Encounter (Signed)
Please refer to weight management

## 2020-10-20 NOTE — Telephone Encounter (Signed)
Patient called and asked if it could be switched to something else that is covered by her insurance. Also something that is much cheaper. She can be reached at [phone]. Please advise

## 2020-10-20 NOTE — Telephone Encounter (Signed)
Does not look like her insurance covers a weight loss medication like this.  Would she like a referral to a medical weight management clinic?

## 2020-10-20 NOTE — Telephone Encounter (Signed)
Pt is requesting Tiffany call her back re this ongoing call at [phone]

## 2020-10-20 NOTE — Telephone Encounter (Signed)
Routing to provider  

## 2020-10-21 ENCOUNTER — Other Ambulatory Visit: Payer: Self-pay | Admitting: Nurse Practitioner

## 2020-10-21 NOTE — Telephone Encounter (Signed)
Referral placed to medical weight management

## 2020-10-30 ENCOUNTER — Ambulatory Visit: Payer: Medicaid Other | Admitting: Nurse Practitioner

## 2020-11-13 DIAGNOSIS — J3489 Other specified disorders of nose and nasal sinuses: Secondary | ICD-10-CM | POA: Diagnosis not present

## 2020-11-13 DIAGNOSIS — Z20822 Contact with and (suspected) exposure to covid-19: Secondary | ICD-10-CM | POA: Diagnosis not present

## 2020-11-13 DIAGNOSIS — R059 Cough, unspecified: Secondary | ICD-10-CM | POA: Diagnosis not present

## 2020-11-17 ENCOUNTER — Other Ambulatory Visit: Payer: Self-pay | Admitting: Family Medicine

## 2020-11-17 NOTE — Telephone Encounter (Signed)
Requested medication (s) are due for refill today:   No  Requested medication (s) are on the active medication list:   Yes but from 2019.  Future visit scheduled:   No   Last ordered: 06/28/2018 #30, 2 refills by Roosvelt Maser.   Note on 10/01/2020 is that she is no longer taking the Lasix.  Returned for further disposition by provider.   Requested Prescriptions  Pending Prescriptions Disp Refills   furosemide (LASIX) 20 MG tablet [Pharmacy Med Name: FUROSEMIDE 20MG  TABLETS] 30 tablet 2    Sig: TAKE 1 TABLET BY MOUTH DAILY      Cardiovascular:  Diuretics - Loop Passed - 11/17/2020 11:04 AM      Passed - K in normal range and within 360 days    Potassium  Date Value Ref Range Status  10/01/2020 4.2 3.5 - 5.2 mmol/L Final          Passed - Ca in normal range and within 360 days    Calcium  Date Value Ref Range Status  10/01/2020 8.9 8.7 - 10.2 mg/dL Final          Passed - Na in normal range and within 360 days    Sodium  Date Value Ref Range Status  10/01/2020 140 134 - 144 mmol/L Final          Passed - Cr in normal range and within 360 days    Creatinine, Ser  Date Value Ref Range Status  10/01/2020 0.90 0.57 - 1.00 mg/dL Final          Passed - Last BP in normal range    BP Readings from Last 1 Encounters:  10/01/20 127/85          Passed - Valid encounter within last 6 months    Recent Outpatient Visits           1 month ago Morbid obesity (HCC)   [facility] 13/11/21, NP   2 years ago Lower extremity edema   [facility], [place]   2 years ago Anoxic brain injury [facility])   [facility], [place]   3 years ago Anxiety   [facility] [place], [place]   3 years ago Encounter to establish care   [facility], [place], [place]

## 2020-11-17 NOTE — Telephone Encounter (Signed)
Pt is completely out of the medication requested and needs asap/ pt stated she has fluid on her ankles / please advise

## 2020-11-17 NOTE — Telephone Encounter (Signed)
Routing to providers in office. Previous RL patient, last saw Elnita Maxwell in November. Patient states her ankles are swelling and needs a new prescription.

## 2020-11-17 NOTE — Telephone Encounter (Signed)
It looks like she has been off that medicine for at least 2 years. Swelling not discussed at her last appointment. Will need to be seen for refill of lasix.

## 2020-11-17 NOTE — Telephone Encounter (Signed)
Appt scheduled

## 2020-11-18 ENCOUNTER — Ambulatory Visit: Payer: Self-pay | Admitting: Dietician

## 2020-11-19 ENCOUNTER — Encounter: Payer: Self-pay | Admitting: Family Medicine

## 2020-11-19 ENCOUNTER — Ambulatory Visit (INDEPENDENT_AMBULATORY_CARE_PROVIDER_SITE_OTHER): Payer: Medicaid Other | Admitting: Family Medicine

## 2020-11-19 ENCOUNTER — Other Ambulatory Visit: Payer: Self-pay

## 2020-11-19 VITALS — BP 137/96 | HR 98 | Temp 98.2°F | Wt 261.0 lb

## 2020-11-19 DIAGNOSIS — M7989 Other specified soft tissue disorders: Secondary | ICD-10-CM | POA: Insufficient documentation

## 2020-11-19 DIAGNOSIS — F1910 Other psychoactive substance abuse, uncomplicated: Secondary | ICD-10-CM | POA: Diagnosis not present

## 2020-11-19 MED ORDER — FUROSEMIDE 20 MG PO TABS: 20.0000 mg | ORAL_TABLET | Freq: Every day | ORAL | 1 refills | Status: AC

## 2020-11-19 NOTE — Patient Instructions (Signed)
It was great to see you!  Our plans for today:  - Take the lasix daily. - We are checking some labs today, we will release these results to your MyChart. - Follow up in 1 month.  Take care and seek immediate care sooner if you develop any concerns.   Dr. Linwood Dibbles

## 2020-11-19 NOTE — Assessment & Plan Note (Signed)
Chronic with recent worsening. Will check CMP, BNP given prior meth use. Consider echo if elevated. Rx given for lasix. F/u in one month.

## 2020-11-19 NOTE — Progress Notes (Signed)
   SUBJECTIVE:   CHIEF COMPLAINT / HPI:   Patient Active Problem List   Diagnosis Date Noted  . Leg swelling 11/19/2020  . Morbid obesity (HCC) 10/01/2020  . Anxiety 09/29/2017  . Tracheitis   . Acute blood loss anemia   . Poor fluid intake   . Attention and concentration deficit   . Anoxic encephalopathy (HCC) 08/12/2016  . Polysubstance abuse (HCC) 08/12/2016  . Elevated LFTs   . Shock liver   . Acute respiratory failure (HCC) 08/05/2016  . Anoxic brain injury (HCC) 07/22/2016    Leg Swelling - ongoing for 3 years, worsened in the past year.  - ankles and feet b/l, L>R - previously on lasix 20mg  with good effect - no new meds.  - no issues urinating.  - no prior h/o cardiac, renal issues, blood clots. Prior h/o shock liver during hospitalization for anoxia but since resolved. - no longer on meth, just got out of rehab. - denies chest pain, SOB, recent immobility, blood in stool, weight loss - recent 2 pillow orthopnea.   OBJECTIVE:   BP (!) 137/96   Pulse 98   Temp 98.2 F (36.8 C)   Wt 261 lb (118.4 kg)   LMP 11/11/2020   SpO2 97%   BMI 47.28 kg/m   Gen: overweight, in NAD Card: RRR Lungs: CTAB, no crackles Ext: WWP, 2+ pitting edema to shins.  ASSESSMENT/PLAN:   Leg swelling Chronic with recent worsening. Will check CMP, BNP given prior meth use. Consider echo if elevated. Rx given for lasix. F/u in one month.   11/13/2020, DO

## 2020-11-20 LAB — BRAIN NATRIURETIC PEPTIDE: BNP: 10.3 pg/mL (ref 0.0–100.0)

## 2020-11-20 LAB — COMPREHENSIVE METABOLIC PANEL
ALT: 25 IU/L (ref 0–32)
AST: 16 IU/L (ref 0–40)
Albumin/Globulin Ratio: 1.3 (ref 1.2–2.2)
Albumin: 3.9 g/dL (ref 3.8–4.8)
Alkaline Phosphatase: 50 IU/L (ref 44–121)
BUN/Creatinine Ratio: 13 (ref 9–23)
BUN: 12 mg/dL (ref 6–20)
Bilirubin Total: 0.2 mg/dL (ref 0.0–1.2)
CO2: 26 mmol/L (ref 20–29)
Calcium: 9.1 mg/dL (ref 8.7–10.2)
Chloride: 101 mmol/L (ref 96–106)
Creatinine, Ser: 0.89 mg/dL (ref 0.57–1.00)
GFR calc Af Amer: 96 mL/min/{1.73_m2} (ref >59)
GFR calc non Af Amer: 83 mL/min/{1.73_m2} (ref >59)
Globulin, Total: 3 g/dL (ref 1.5–4.5)
Glucose: 108 mg/dL — ABNORMAL HIGH (ref 65–99)
Potassium: 4.2 mmol/L (ref 3.5–5.2)
Sodium: 139 mmol/L (ref 134–144)
Total Protein: 6.9 g/dL (ref 6.0–8.5)

## 2020-11-20 LAB — HEPATITIS C ANTIBODY: Hep C Virus Ab: <0.1 s/co ratio (ref 0.0–0.9)

## 2020-11-20 LAB — HIV ANTIBODY (ROUTINE TESTING W REFLEX): HIV Screen 4th Generation wRfx: NONREACTIVE

## 2020-12-17 ENCOUNTER — Ambulatory Visit: Payer: Medicaid Other | Admitting: Family Medicine

## 2020-12-17 NOTE — Progress Notes (Deleted)
   SUBJECTIVE:   CHIEF COMPLAINT / HPI:   Patient Active Problem List   Diagnosis Date Noted  . Leg swelling 11/19/2020  . Morbid obesity (HCC) 10/01/2020  . Anxiety 09/29/2017  . Tracheitis   . Acute blood loss anemia   . Poor fluid intake   . Attention and concentration deficit   . Anoxic encephalopathy (HCC) 08/12/2016  . Polysubstance abuse (HCC) 08/12/2016  . Elevated LFTs   . Shock liver   . Acute respiratory failure (HCC) 08/05/2016  . Anoxic brain injury (HCC) 07/22/2016   Hypertension/Leg swelling - previously seen 12/30 for leg swelling after being out of lasix. BNP, Cr wnl. - Medications: lasix 20mg  daily - Compliance: *** - Checking BP at home: *** - Denies any SOB, CP, vision changes, LE edema, medication SEs, or symptoms of hypotension - Diet: *** - Exercise: ***   OBJECTIVE:   There were no vitals taken for this visit.  ***  ASSESSMENT/PLAN:   No problem-specific Assessment & Plan notes found for this encounter.     , DO [facility]

## 2020-12-23 ENCOUNTER — Ambulatory Visit: Payer: Medicaid Other | Admitting: Family Medicine

## 2020-12-23 NOTE — Progress Notes (Deleted)
   SUBJECTIVE:   CHIEF COMPLAINT / HPI:   OBESITY - Meds: none - Previously on saxenda - Complications of obesity: elevated BP without HTN dx - Peak weight: *** - Weight loss goal: *** - Weight loss to date: *** - Diet: *** - Exercise: ***   OBJECTIVE:   There were no vitals taken for this visit.  ***  ASSESSMENT/PLAN:   No problem-specific Assessment & Plan notes found for this encounter.     Caro Laroche, DO [facility]

## 2022-03-22 DIAGNOSIS — F411 Generalized anxiety disorder: Secondary | ICD-10-CM | POA: Diagnosis not present

## 2022-03-29 DIAGNOSIS — F411 Generalized anxiety disorder: Secondary | ICD-10-CM | POA: Diagnosis not present

## 2022-04-05 DIAGNOSIS — F411 Generalized anxiety disorder: Secondary | ICD-10-CM | POA: Diagnosis not present

## 2022-04-12 DIAGNOSIS — F411 Generalized anxiety disorder: Secondary | ICD-10-CM | POA: Diagnosis not present

## 2022-04-19 DIAGNOSIS — F411 Generalized anxiety disorder: Secondary | ICD-10-CM | POA: Diagnosis not present

## 2022-05-03 DIAGNOSIS — F411 Generalized anxiety disorder: Secondary | ICD-10-CM | POA: Diagnosis not present

## 2022-05-31 DIAGNOSIS — F411 Generalized anxiety disorder: Secondary | ICD-10-CM | POA: Diagnosis not present

## 2022-06-30 ENCOUNTER — Ambulatory Visit: Payer: Self-pay

## 2022-06-30 DIAGNOSIS — R112 Nausea with vomiting, unspecified: Secondary | ICD-10-CM | POA: Diagnosis not present

## 2022-06-30 DIAGNOSIS — Z3202 Encounter for pregnancy test, result negative: Secondary | ICD-10-CM | POA: Diagnosis not present

## 2022-06-30 DIAGNOSIS — R82998 Other abnormal findings in urine: Secondary | ICD-10-CM | POA: Diagnosis not present

## 2022-06-30 DIAGNOSIS — K529 Noninfective gastroenteritis and colitis, unspecified: Secondary | ICD-10-CM | POA: Diagnosis not present

## 2022-06-30 NOTE — Telephone Encounter (Signed)
Summary: vomiting and diarrhea   Pt has a stomach virus/ sick to stomach, vomiting and diarrhea / pt can not go to court today and asked for a visit but nothing was available until tomorrow / please advise      Chief Complaint: diarrhea, vomiting Symptoms: abd. Cramping,watery diarrhea Frequency: started yesterday Pertinent Negatives: Patient denies fever Disposition: [] ED /[x] Urgent Care (no appt availability in office) / [] Appointment(In office/virtual)/ []  [facility]/ [] Home Care/ [] Refused Recommended Disposition /[] [facility]/ []  Follow-up with PCP Additional Notes: No appts today- Advised pt to go to UC. Pt has a court appt today.   Reason for Disposition  [1] SEVERE diarrhea (e.g., 7 or more times / day more than normal) AND [2] present > 24 hours (1 day)  Answer Assessment - Initial Assessment Questions 1. DIARRHEA SEVERITY: "How bad is the diarrhea?" "How many more stools have you had in the past 24 hours than normal?"    - NO DIARRHEA (SCALE 0)   - MILD (SCALE 1-3): Few loose or mushy BMs; increase of 1-3 stools over normal daily number of stools; mild increase in ostomy output.   -  MODERATE (SCALE 4-7): Increase of 4-6 stools daily over normal; moderate increase in ostomy output.   -  SEVERE (SCALE 8-10; OR "WORST POSSIBLE"): Increase of 7 or more stools daily over normal; moderate increase in ostomy output; incontinence.     severe 2. ONSET: "When did the diarrhea begin?"      yesterday 3. BM CONSISTENCY: "How loose or watery is the diarrhea?"      watery 4. VOMITING: "Are you also vomiting?" If Yes, ask: "How many times in the past 24 hours?"      Yes-4 times 5. ABDOMEN PAIN: "Are you having any abdomen pain?" If Yes, ask: "What does it feel like?" (e.g., crampy, dull, intermittent, constant)      When vomiting feels like cramping 6. ABDOMEN PAIN SEVERITY: If present, ask: "How bad is the pain?"  (e.g., Scale 1-10; mild, moderate, or severe)   -  MILD (1-3): doesn't interfere with normal activities, abdomen soft and not tender to touch    - MODERATE (4-7): interferes with normal activities or awakens from sleep, abdomen tender to touch    - SEVERE (8-10): excruciating pain, doubled over, unable to do any normal activities       moderate 7. ORAL INTAKE: If vomiting, "Have you been able to drink liquids?" "How much liquids have you had in the past 24 hours?"     Yes- vomits back up-Gatorade 12 oz 8. HYDRATION: "Any signs of dehydration?" (e.g., dry mouth [not just dry lips], too weak to stand, dizziness, new weight loss) "When did you last urinate?"     No-this am  9. EXPOSURE: "Have you traveled to a foreign country recently?" "Have you been exposed to anyone with diarrhea?" "Could you have eaten any food that was spoiled?"     no 10. ANTIBIOTIC USE: "Are you taking antibiotics now or have you taken antibiotics in the past 2 months?"       no 11. OTHER SYMPTOMS: "Do you have any other symptoms?" (e.g., fever, blood in stool)       no 12. PREGNANCY: "Is there any chance you are pregnant?" "When was your last menstrual period?"       N/a  Protocols used: [facility]

## 2022-07-01 ENCOUNTER — Ambulatory Visit: Payer: Medicaid Other | Admitting: Physician Assistant

## 2022-10-19 ENCOUNTER — Telehealth: Payer: Self-pay

## 2022-10-19 NOTE — Telephone Encounter (Signed)
Copied from CRM [id number]. Topic: Medical Record Request - Other >> Oct 19, 2022  9:09 AM De Blanch wrote: Reason for CRM: Pt stated she called a few months ago requesting her medical records. However, has not received an update on this.  Pt is requesting all medical records. Pt stated she would like to pick up when ready.  Please advise.

## 2022-10-24 NOTE — Telephone Encounter (Signed)
Called patient and let her know that she still needed to come in to fill out a medical release for in order to get her records release. I also let her know that she could get her records through [facility] and I walked her through how to do so. Patient verbalized understanding.

## 2022-11-15 ENCOUNTER — Other Ambulatory Visit: Payer: Self-pay | Admitting: Nurse Practitioner

## 2022-11-16 MED ORDER — ALBUTEROL SULFATE HFA 108 (90 BASE) MCG/ACT IN AERS: 2.0000 | INHALATION_SPRAY | RESPIRATORY_TRACT | 0 refills | Status: AC | PRN

## 2023-01-07 ENCOUNTER — Encounter: Payer: Self-pay | Admitting: Nurse Practitioner

## 2023-12-29 ENCOUNTER — Ambulatory Visit: Payer: Medicaid Other | Admitting: Pediatrics

## 2024-08-15 DIAGNOSIS — J019 Acute sinusitis, unspecified: Secondary | ICD-10-CM | POA: Diagnosis not present

## 2024-08-15 DIAGNOSIS — R051 Acute cough: Secondary | ICD-10-CM | POA: Diagnosis not present

## 2024-08-15 DIAGNOSIS — J4531 Mild persistent asthma with (acute) exacerbation: Secondary | ICD-10-CM | POA: Diagnosis not present

## 2024-08-15 DIAGNOSIS — B9689 Other specified bacterial agents as the cause of diseases classified elsewhere: Secondary | ICD-10-CM | POA: Diagnosis not present
